# Patient Record
Sex: Male | Born: 1960 | Race: Black or African American | Hispanic: No | Marital: Single | Smoking: Current every day smoker
Health system: Southern US, Community
[De-identification: ages and names within clinical notes are randomized; demographics above are authoritative.]

## PROBLEM LIST (undated history)

## (undated) DIAGNOSIS — I10 Essential (primary) hypertension: Secondary | ICD-10-CM

---

## 2010-07-31 ENCOUNTER — Emergency Department: Payer: Self-pay | Admitting: Emergency Medicine

## 2012-01-02 ENCOUNTER — Emergency Department: Payer: Self-pay | Admitting: Emergency Medicine

## 2013-01-25 ENCOUNTER — Ambulatory Visit: Payer: Self-pay

## 2013-01-25 ENCOUNTER — Emergency Department: Payer: Self-pay | Admitting: Emergency Medicine

## 2013-01-30 ENCOUNTER — Emergency Department: Payer: Self-pay | Admitting: Emergency Medicine

## 2013-10-10 ENCOUNTER — Emergency Department: Payer: Self-pay | Admitting: Emergency Medicine

## 2013-10-10 LAB — URINALYSIS, COMPLETE
BACTERIA: NONE SEEN
BILIRUBIN, UR: NEGATIVE
BLOOD: NEGATIVE
GLUCOSE, UR: NEGATIVE mg/dL (ref 0–75)
Ketone: NEGATIVE
Leukocyte Esterase: NEGATIVE
Nitrite: NEGATIVE
PH: 7 (ref 4.5–8.0)
PROTEIN: NEGATIVE
SQUAMOUS EPITHELIAL: NONE SEEN
Specific Gravity: 1.015 (ref 1.003–1.030)
WBC UR: <1 /HPF (ref 0–5)

## 2013-10-10 LAB — BASIC METABOLIC PANEL
ANION GAP: 5 — AB (ref 7–16)
BUN: 17 mg/dL (ref 7–18)
CALCIUM: 8.6 mg/dL (ref 8.5–10.1)
CO2: 27 mmol/L (ref 21–32)
CREATININE: 1.03 mg/dL (ref 0.60–1.30)
Chloride: 111 mmol/L — ABNORMAL HIGH (ref 98–107)
EGFR (African American): >60
GLUCOSE: 89 mg/dL (ref 65–99)
OSMOLALITY: 286 (ref 275–301)
Potassium: 3.8 mmol/L (ref 3.5–5.1)
SODIUM: 143 mmol/L (ref 136–145)

## 2013-10-10 LAB — GC/CHLAMYDIA PROBE AMP

## 2014-12-22 ENCOUNTER — Emergency Department
Admission: EM | Admit: 2014-12-22 | Discharge: 2014-12-22 | Disposition: A | Discharge status: Home / Self Care | Payer: Self-pay | Attending: Emergency Medicine | Admitting: Emergency Medicine

## 2014-12-22 DIAGNOSIS — K047 Periapical abscess without sinus: Secondary | ICD-10-CM | POA: Insufficient documentation

## 2014-12-22 DIAGNOSIS — I10 Essential (primary) hypertension: Secondary | ICD-10-CM | POA: Insufficient documentation

## 2014-12-22 MED ORDER — TRAMADOL HCL 50 MG PO TABS: 50.0000 mg | ORAL_TABLET | Freq: Four times a day (QID) | ORAL | Status: DC | PRN

## 2014-12-22 MED ORDER — AMOXICILLIN 500 MG PO TABS: 500.0000 mg | ORAL_TABLET | Freq: Three times a day (TID) | ORAL | Status: DC

## 2014-12-22 NOTE — ED Notes (Signed)
Pt with right face/ cheek swelling that started yesterday and got worse today. Has dental problems that seems to be causing infection.

## 2014-12-22 NOTE — ED Provider Notes (Signed)
[facility] Emergency Department Provider Note ____________________________________________  Time seen: Approximately 3:36 PM  I have reviewed the triage vital signs and the nursing notes.   HISTORY  Chief Complaint Abscess   HPI Paul Leonard is a 54 y.o. male who presents to the emergency department for evaluation of right-sided facial swelling that started on Friday. He states that this tooth has been bad for a long time and the swelling comes and goes, but this time it has become very painful. He has not been on any antibiotics the last 30 days. He has attempted to take Advil which has given him some relief but the swelling continues.   No past medical history on file.  There are no active problems to display for this patient.   No past surgical history on file.  Current Outpatient Rx  Name  Route  Sig  Dispense  Refill  . amoxicillin (AMOXIL) 500 MG tablet   Oral   Take 1 tablet (500 mg total) by mouth 3 (three) times daily.   30 tablet   0   . traMADol (ULTRAM) 50 MG tablet   Oral   Take 1 tablet (50 mg total) by mouth every 6 (six) hours as needed.   9 tablet   0     Allergies Review of patient's allergies indicates no known allergies.  No family history on file.  Social History Social History  Substance Use Topics  . Smoking status: Not on file  . Smokeless tobacco: Not on file  . Alcohol Use: Not on file    Review of Systems Constitutional: No fever/chills Eyes: No visual changes. ENT: No sore throat. Cardiovascular: Denies chest pain. Respiratory: Denies shortness of breath. Gastrointestinal: No abdominal pain.  No nausea, no vomiting.  Genitourinary: Negative for dysuria. Musculoskeletal: Negative for back pain. Skin: Negative for rash. Neurological: Negative for headaches, focal weakness or numbness. 10-point ROS otherwise negative.  ____________________________________________   PHYSICAL EXAM:  VITAL  SIGNS: ED Triage Vitals  Enc Vitals Group     BP 12/22/14 1516 170/109 mmHg     Pulse Rate 12/22/14 1516 70     Resp 12/22/14 1516 18     Temp 12/22/14 1516 98.2 F (36.8 C)     Temp Source 12/22/14 1516 Oral     SpO2 12/22/14 1516 97 %     Weight 12/22/14 1516 195 lb (88.451 kg)     Height 12/22/14 1516 5\' 11"  (1.803 m)     Head Cir --      Peak Flow --      Pain Score 12/22/14 1517 8     Pain Loc --      Pain Edu? --      Excl. in [place]? --     Constitutional: Alert and oriented. Well appearing and in no acute distress. Eyes: Conjunctivae are normal. PERRL. EOMI. Head: Atraumatic. Nose: No congestion/rhinnorhea. Mouth/Throat: Mucous membranes are moist.  Oropharynx non-erythematous. Mild mandibular swelling noted on the right side. Periodontal Exam    Neck: No stridor.  Hematological/Lymphatic/Immunilogical: No cervical lymphadenopathy. Cardiovascular:   Good peripheral circulation. Respiratory: Normal respiratory effort.  No retractions. Musculoskeletal: No lower extremity tenderness nor edema.  No joint effusions. Neurologic:  Normal speech and language. No gross focal neurologic deficits are appreciated. Speech is normal. No gait instability. Skin:  Skin is warm, dry and intact. No rash noted. Psychiatric: Mood and affect are normal. Speech and behavior are normal.  ____________________________________________   LABS (all labs  ordered are listed, but only abnormal results are displayed)  Labs Reviewed - No data to display ____________________________________________   RADIOLOGY  Not indicated ____________________________________________   PROCEDURES  Procedure(s) performed: None  Critical Care performed: No  ____________________________________________   INITIAL IMPRESSION / ASSESSMENT AND PLAN / ED COURSE  Pertinent labs & imaging results that were available during my care of the patient were reviewed by me and considered in my medical decision  making (see chart for details).  Patient was advised to see the dentist within 14 days. Also advised to take the antibiotic until finished. Instructed to return to the ER for symptoms that change or worsen if you are unable to schedule an appointment. ____________________________________________   FINAL CLINICAL IMPRESSION(S) / ED DIAGNOSES  Final diagnoses:  Dental abscess  Essential hypertension       Victorino Dike, FNP 12/22/14 1730  Daymon Larsen, MD 12/22/14 1754

## 2014-12-22 NOTE — ED Notes (Signed)
Rt sided facial swelling since Friday.

## 2014-12-22 NOTE — ED Notes (Signed)
NAD noted at time of D/C. Pt denies questions or concerns. Pt ambulatory to the lobby at this time.  

## 2014-12-22 NOTE — Discharge Instructions (Signed)
Dental Abscess °A dental abscess is a collection of pus in or around a tooth. °CAUSES °This condition is caused by a bacterial infection around the root of the tooth that involves the inner part of the tooth (pulp). It may result from: °· Severe tooth decay. °· Trauma to the tooth that allows bacteria to enter into the pulp, such as a broken or chipped tooth. °· Severe gum disease around a tooth. °SYMPTOMS °Symptoms of this condition include: °· Severe pain in and around the infected tooth. °· Swelling and redness around the infected tooth, in the mouth, or in the face. °· Tenderness. °· Pus drainage. °· Bad breath. °· Bitter taste in the mouth. °· Difficulty swallowing. °· Difficulty opening the mouth. °· Nausea. °· Vomiting. °· Chills. °· Swollen neck glands. °· Fever. °DIAGNOSIS °This condition is diagnosed with examination of the infected tooth. During the exam, your dentist may tap on the infected tooth. Your dentist will also ask about your medical and dental history and may order X-rays. °TREATMENT °This condition is treated by eliminating the infection. This may be done with: °· Antibiotic medicine. °· A root canal. This may be performed to save the tooth. °· Pulling (extracting) the tooth. This may also involve draining the abscess. This is done if the tooth cannot be saved. °HOME CARE INSTRUCTIONS °· Take medicines only as directed by your dentist. °· If you were prescribed antibiotic medicine, finish all of it even if you start to feel better. °· Rinse your mouth (gargle) often with salt water to relieve pain or swelling. °· Do not drive or operate heavy machinery while taking pain medicine. °· Do not apply heat to the outside of your mouth. °· Keep all follow-up visits as directed by your dentist. This is important. °SEEK MEDICAL CARE IF: °· Your pain is worse and is not helped by medicine. °SEEK IMMEDIATE MEDICAL CARE IF: °· You have a fever or chills. °· Your symptoms suddenly get worse. °· You have a  very bad headache. °· You have problems breathing or swallowing. °· You have trouble opening your mouth. °· You have swelling in your neck or around your eye. °  °This information is not intended to replace advice given to you by your health care provider. Make sure you discuss any questions you have with your health care provider. °  °Document Released: 03/01/2005 Document Revised: 07/16/2014 Document Reviewed: 02/26/2014 °Elsevier Interactive Patient Education ©2016 [employer] ° ° ° ° ° ° °OPTIONS FOR DENTAL FOLLOW UP CARE ° °[place] [facility] and [facility] - Local Safety Net Dental Clinics °http://www.ncdhhs.gov/dph/oralhealth/services/safetynetclinics.htm °  °[facility] (336-562-3123) ° °[facility] [place] (919-933-9087) ° °[facility] (919-663-1744 ext 237) ° °[facility] (336-570-6415) ° °[facility] (919-968-2025) °This clinic caters to the indigent population and is on a lottery system. °Location: °[facility], [address], [place] °Clinic Hours: °Wednesdays from 6pm - 9pm, patients seen by a lottery system. °For dates, call or go to www.med.[facility].edu/[facility]/patients/Dental-[facility] °Services: °Cleanings, fillings and simple extractions. °Payment Options: °DENTAL WORK IS FREE OF CHARGE. Bring proof of income or support. °Best way to get seen: °Arrive at 5:15 pm - this is a lottery, NOT first come/first serve, so arriving earlier will not increase your chances of being seen. °  °  °[facility] °919-537-3737 °Select option 1 for emergencies °  °Location: °[facility], [address], [place] °  Clinic Hours: °No walk-ins accepted - call the day before to schedule an appointment. °Check in times are 9:30 am and 1:30 pm. °Services: °Simple extractions, temporary fillings, pulpectomy/pulp debridement, uncomplicated abscess drainage. °Payment Options: °PAYMENT IS  DUE AT THE TIME OF SERVICE.  Fee is usually $100-200, additional surgical procedures (e.g. abscess drainage) may be extra. °Cash, checks, Visa/MasterCard accepted.  Can file Medicaid if patient is covered for dental - patient should call case worker to check. °No discount for [facility] patients. °Best way to get seen: °MUST call the day before and get onto the schedule. Can usually be seen the next 1-2 days. No walk-ins accepted. °  °  °[facility] °[phone] °  °Location: °[facility], [address], [place] °Clinic Hours: °M, W, Th, F 8am or 1:30pm, Tues 9a or 1:30 - first come/first served. °Services: °Simple extractions, temporary fillings, uncomplicated abscess drainage.  You do not need to be an [place] resident. °Payment Options: °PAYMENT IS DUE AT THE TIME OF SERVICE. °Dental insurance, otherwise sliding scale - bring proof of income or support. °Depending on income and treatment needed, cost is usually $50-200. °Best way to get seen: °Arrive early as it is first come/first served. °  °  °[facility] °[phone] °  °Location: °[address] °Clinic Hours: °Mon-Thu 8a-5p °Services: °Most basic dental services including extractions and fillings. °Payment Options: °PAYMENT IS DUE AT THE TIME OF SERVICE. °Sliding scale, up to 50% off - bring proof if income or support. °Medicaid with dental option accepted. °Best way to get seen: °Call to schedule an appointment, can usually be seen within 2 weeks OR they will try to see walk-ins - show up at 8a or 2p (you may have to wait). °  °  °[facility] °919-245-2435 °[place] RESIDENTS ONLY °  °Location: °[facility], [address], [place], [place] [address] °Clinic Hours: By appointment only. °Monday - Thursday 8am-5pm, Friday 8am-12pm °Services: Cleanings, fillings, extractions. °Payment Options: °PAYMENT IS DUE AT THE TIME OF  SERVICE. °Cash, Visa or MasterCard. Sliding scale - $30 minimum per service. °Best way to get seen: °Come in to office, complete packet and make an appointment - need proof of income °or support monies for each household member and proof of [place] residence. °Usually takes about a month to get in. °  °  °[facility] °[phone] °  °Location: °[address] [place] °Clinic Hours: Walk-in Urgent Care Dental Services are offered Monday-Friday mornings only. °The numbers of emergencies accepted daily is limited to the number of °providers available. °Maximum 15 - Mondays, Wednesdays & Thursdays °Maximum 10 - Tuesdays & Fridays °Services: °You do not need to be a [place] resident to be seen for a dental emergency. °Emergencies are defined as pain, swelling, abnormal bleeding, or dental trauma. Walkins will receive x-rays if needed. °NOTE: Dental cleaning is not an emergency. °Payment Options: °PAYMENT IS DUE AT THE TIME OF SERVICE. °Minimum co-pay is $40.00 for uninsured patients. °Minimum co-pay is $3.00 for Medicaid with dental coverage. °Dental Insurance is accepted and must be presented at time of visit. °Medicare does not cover dental. °Forms of payment: Cash, credit card, checks. °Best way to get seen: °If not previously registered with the clinic, walk-in dental registration begins at 7:15 am and is on a first come/first serve basis. °If previously registered with the clinic, call to make an appointment. °  °  °  [facility] °[phone] °[place] RESIDENTS ONLY °  °Location: °[address], [place], [place] °Clinic Hours: °Mon-Thu 10a-2p °Services: Extractions only! °Payment Options: °FREE (donations accepted) - bring proof of income or support °Best way to get seen: °Call and schedule an appointment OR come at 8am on the 1st Monday of every month (except for holidays) when it is first come/first served. °  °  °[facility] °[phone] °   °Location: °[address],  °Clinic Hours: °Friday mornings °Services, Payment Options, Best way to get seen: °Call for info °

## 2019-01-01 ENCOUNTER — Encounter: Payer: Self-pay | Admitting: Emergency Medicine

## 2019-01-01 ENCOUNTER — Inpatient Hospital Stay
Admission: EM | Admit: 2019-01-01 | Discharge: 2019-01-04 | DRG: 064 | Disposition: A | Discharge status: Home Health | Payer: Medicaid Other | Attending: Internal Medicine | Admitting: Internal Medicine

## 2019-01-01 ENCOUNTER — Emergency Department: Payer: Medicaid Other

## 2019-01-01 ENCOUNTER — Other Ambulatory Visit: Payer: Self-pay

## 2019-01-01 DIAGNOSIS — Z8249 Family history of ischemic heart disease and other diseases of the circulatory system: Secondary | ICD-10-CM

## 2019-01-01 DIAGNOSIS — G936 Cerebral edema: Secondary | ICD-10-CM | POA: Diagnosis present

## 2019-01-01 DIAGNOSIS — I771 Stricture of artery: Secondary | ICD-10-CM

## 2019-01-01 DIAGNOSIS — I6312 Cerebral infarction due to embolism of basilar artery: Principal | ICD-10-CM | POA: Diagnosis present

## 2019-01-01 DIAGNOSIS — R109 Unspecified abdominal pain: Secondary | ICD-10-CM

## 2019-01-01 DIAGNOSIS — K5900 Constipation, unspecified: Secondary | ICD-10-CM | POA: Diagnosis not present

## 2019-01-01 DIAGNOSIS — F17210 Nicotine dependence, cigarettes, uncomplicated: Secondary | ICD-10-CM | POA: Diagnosis present

## 2019-01-01 DIAGNOSIS — R29703 NIHSS score 3: Secondary | ICD-10-CM | POA: Diagnosis present

## 2019-01-01 DIAGNOSIS — R4781 Slurred speech: Secondary | ICD-10-CM | POA: Diagnosis present

## 2019-01-01 DIAGNOSIS — D320 Benign neoplasm of cerebral meninges: Secondary | ICD-10-CM | POA: Diagnosis present

## 2019-01-01 DIAGNOSIS — R911 Solitary pulmonary nodule: Secondary | ICD-10-CM | POA: Diagnosis present

## 2019-01-01 DIAGNOSIS — I10 Essential (primary) hypertension: Secondary | ICD-10-CM | POA: Diagnosis present

## 2019-01-01 DIAGNOSIS — I639 Cerebral infarction, unspecified: Secondary | ICD-10-CM

## 2019-01-01 DIAGNOSIS — Z20828 Contact with and (suspected) exposure to other viral communicable diseases: Secondary | ICD-10-CM | POA: Diagnosis present

## 2019-01-01 DIAGNOSIS — F1490 Cocaine use, unspecified, uncomplicated: Secondary | ICD-10-CM | POA: Diagnosis present

## 2019-01-01 DIAGNOSIS — I6300 Cerebral infarction due to thrombosis of unspecified precerebral artery: Secondary | ICD-10-CM

## 2019-01-01 DIAGNOSIS — I739 Peripheral vascular disease, unspecified: Secondary | ICD-10-CM | POA: Diagnosis present

## 2019-01-01 DIAGNOSIS — G9389 Other specified disorders of brain: Secondary | ICD-10-CM

## 2019-01-01 HISTORY — DX: Essential (primary) hypertension: I10

## 2019-01-01 LAB — CBC
HCT: 48 % (ref 39.0–52.0)
Hemoglobin: 15.1 g/dL (ref 13.0–17.0)
MCH: 27.9 pg (ref 26.0–34.0)
MCHC: 31.5 g/dL (ref 30.0–36.0)
MCV: 88.7 fL (ref 80.0–100.0)
Platelets: 287 10*3/uL (ref 150–400)
RBC: 5.41 MIL/uL (ref 4.22–5.81)
RDW: 12.8 % (ref 11.5–15.5)
WBC: 4.5 10*3/uL (ref 4.0–10.5)
nRBC: 0 % (ref 0.0–0.2)

## 2019-01-01 LAB — APTT: aPTT: 34 seconds (ref 24–36)

## 2019-01-01 LAB — DIFFERENTIAL
Abs Immature Granulocytes: 0.01 10*3/uL (ref 0.00–0.07)
Basophils Absolute: 0 10*3/uL (ref 0.0–0.1)
Basophils Relative: 0 %
Eosinophils Absolute: 0.1 10*3/uL (ref 0.0–0.5)
Eosinophils Relative: 1 %
Immature Granulocytes: 0 %
Lymphocytes Relative: 33 %
Lymphs Abs: 1.5 10*3/uL (ref 0.7–4.0)
Monocytes Absolute: 0.5 10*3/uL (ref 0.1–1.0)
Monocytes Relative: 10 %
Neutro Abs: 2.5 10*3/uL (ref 1.7–7.7)
Neutrophils Relative %: 56 %

## 2019-01-01 LAB — COMPREHENSIVE METABOLIC PANEL
ALT: 15 U/L (ref 0–44)
AST: 15 U/L (ref 15–41)
Albumin: 3.8 g/dL (ref 3.5–5.0)
Alkaline Phosphatase: 55 U/L (ref 38–126)
Anion gap: 7 (ref 5–15)
BUN: 15 mg/dL (ref 6–20)
CO2: 28 mmol/L (ref 22–32)
Calcium: 8.9 mg/dL (ref 8.9–10.3)
Chloride: 107 mmol/L (ref 98–111)
Creatinine, Ser: 0.87 mg/dL (ref 0.61–1.24)
GFR calc Af Amer: >60 mL/min (ref >60)
GFR calc non Af Amer: >60 mL/min (ref >60)
Glucose, Bld: 104 mg/dL — ABNORMAL HIGH (ref 70–99)
Potassium: 3.5 mmol/L (ref 3.5–5.1)
Sodium: 142 mmol/L (ref 135–145)
Total Bilirubin: 0.7 mg/dL (ref 0.3–1.2)
Total Protein: 6.7 g/dL (ref 6.5–8.1)

## 2019-01-01 LAB — PROTIME-INR
INR: 1 (ref 0.8–1.2)
Prothrombin Time: 13.2 seconds (ref 11.4–15.2)

## 2019-01-01 MED ORDER — METOPROLOL TARTRATE 5 MG/5ML IV SOLN
5.0000 mg | INTRAVENOUS | Status: DC | PRN
  Filled 2019-01-01: qty 5

## 2019-01-01 MED ORDER — ACETAMINOPHEN 160 MG/5ML PO SOLN
650.0000 mg | ORAL | Status: DC | PRN
  Filled 2019-01-01: qty 20.3

## 2019-01-01 MED ORDER — SENNOSIDES-DOCUSATE SODIUM 8.6-50 MG PO TABS: 1.0000 | ORAL_TABLET | Freq: Every evening | ORAL | Status: DC | PRN

## 2019-01-01 MED ORDER — HYDRALAZINE HCL 20 MG/ML IJ SOLN
5.0000 mg | Freq: Once | INTRAMUSCULAR | Status: AC
  Administered 2019-01-01: 18:00:00 5 mg via INTRAVENOUS
  Filled 2019-01-01: qty 1

## 2019-01-01 MED ORDER — ACETAMINOPHEN 650 MG RE SUPP: 650.0000 mg | RECTAL | Status: DC | PRN

## 2019-01-01 MED ORDER — ACETAMINOPHEN 325 MG PO TABS
650.0000 mg | ORAL_TABLET | ORAL | Status: DC | PRN
  Administered 2019-01-04: 650 mg via ORAL
  Filled 2019-01-01: qty 2

## 2019-01-01 MED ORDER — ASPIRIN EC 325 MG PO TBEC
325.0000 mg | DELAYED_RELEASE_TABLET | Freq: Every day | ORAL | Status: DC
  Administered 2019-01-02: 325 mg via ORAL
  Filled 2019-01-01: qty 1

## 2019-01-01 MED ORDER — METOPROLOL TARTRATE 25 MG PO TABS
25.0000 mg | ORAL_TABLET | Freq: Two times a day (BID) | ORAL | Status: DC
  Administered 2019-01-01 – 2019-01-04 (×6): 25 mg via ORAL
  Filled 2019-01-01 (×6): qty 1

## 2019-01-01 MED ORDER — STROKE: EARLY STAGES OF RECOVERY BOOK
Freq: Once | Status: AC
  Administered 2019-01-01: 21:00:00

## 2019-01-01 MED ORDER — ONDANSETRON HCL 4 MG/2ML IJ SOLN: 4.0000 mg | Freq: Four times a day (QID) | INTRAMUSCULAR | Status: DC | PRN

## 2019-01-01 MED ORDER — DEXAMETHASONE 4 MG PO TABS
4.0000 mg | ORAL_TABLET | Freq: Three times a day (TID) | ORAL | Status: DC
  Administered 2019-01-01 – 2019-01-02 (×3): 4 mg via ORAL
  Filled 2019-01-01 (×4): qty 1

## 2019-01-01 MED ORDER — GADOBUTROL 1 MMOL/ML IV SOLN
8.0000 mL | Freq: Once | INTRAVENOUS | Status: AC | PRN
  Administered 2019-01-01: 16:00:00 8 mL via INTRAVENOUS

## 2019-01-01 MED ORDER — ENOXAPARIN SODIUM 40 MG/0.4ML ~~LOC~~ SOLN
40.0000 mg | SUBCUTANEOUS | Status: DC
  Administered 2019-01-01 – 2019-01-03 (×3): 40 mg via SUBCUTANEOUS
  Filled 2019-01-01 (×3): qty 0.4

## 2019-01-01 MED ORDER — NICOTINE 14 MG/24HR TD PT24
14.0000 mg | MEDICATED_PATCH | Freq: Every day | TRANSDERMAL | Status: DC
  Administered 2019-01-01 – 2019-01-04 (×4): 14 mg via TRANSDERMAL
  Filled 2019-01-01 (×4): qty 1

## 2019-01-01 MED ORDER — HYDRALAZINE HCL 50 MG PO TABS
25.0000 mg | ORAL_TABLET | Freq: Three times a day (TID) | ORAL | Status: DC
  Administered 2019-01-01 – 2019-01-02 (×2): 25 mg via ORAL
  Filled 2019-01-01 (×2): qty 1

## 2019-01-01 MED ORDER — ATORVASTATIN CALCIUM 20 MG PO TABS
40.0000 mg | ORAL_TABLET | Freq: Every day | ORAL | Status: DC
  Administered 2019-01-01 – 2019-01-03 (×3): 40 mg via ORAL
  Filled 2019-01-01 (×3): qty 2

## 2019-01-01 MED ORDER — ASPIRIN 81 MG PO CHEW
324.0000 mg | CHEWABLE_TABLET | Freq: Once | ORAL | Status: AC
  Administered 2019-01-01: 18:00:00 324 mg via ORAL
  Filled 2019-01-01: qty 4

## 2019-01-01 MED ORDER — DEXAMETHASONE SODIUM PHOSPHATE 4 MG/ML IJ SOLN
4.0000 mg | Freq: Once | INTRAMUSCULAR | Status: AC
  Administered 2019-01-01: 4 mg via INTRAVENOUS
  Filled 2019-01-01: qty 1

## 2019-01-01 NOTE — ED Triage Notes (Signed)
Pt to ED from home c/o HTN and numbness to left arm and leg, slurred speech since Friday or Saturday.  Denies new pain, fever, or SOB.  Pt has decreased strength left hand and leg.  States hx of left rotator cuff injury.  Last used cocaine Friday 10/16.  Pt A&Ox4, chest rise even and unlabored at this time.

## 2019-01-01 NOTE — Consult Note (Signed)
Referring Physician:  No referring provider defined for this encounter.  Primary Physician:  Patient, No Pcp Per  Chief Complaint: Meningioma  History of Present Illness: Paul Leonard is a 58 y.o. male who presents as an ED consult for meningioma.  Patient presented to emergency department today after experiencing 3 days of increasing left-sided weakness -greater in lower extremities, and worse in AM.  Also complains of left frontal headache, increased blood pressure, slurred speech.  Head CT without contrast was completed and 4 cm mass was noted.  Neurosurgery was consulted for evaluation Denies any vision changes, recent fall/trauma, confusion, behavioral changes.  Review of Systems:  A 10 point review of systems is negative, except for the pertinent positives and negatives detailed in the HPI.  Past Medical History: Past Medical History:  Diagnosis Date  . Hypertension     Past Surgical History: History reviewed. No pertinent surgical history.  Allergies: Allergies as of 01/01/2019  . (No Known Allergies)    Medications: No current facility-administered medications for this encounter.   Current Outpatient Medications:  .  amoxicillin (AMOXIL) 500 MG tablet, Take 1 tablet (500 mg total) by mouth 3 (three) times daily. (Patient not taking: Reported on 01/01/2019), Disp: 30 tablet, Rfl: 0 .  traMADol (ULTRAM) 50 MG tablet, Take 1 tablet (50 mg total) by mouth every 6 (six) hours as needed. (Patient not taking: Reported on 01/01/2019), Disp: 9 tablet, Rfl: 0   Social History: Social History   Tobacco Use  . Smoking status: Current Every Day Smoker    Packs/day: 1.00    Types: Cigarettes  . Smokeless tobacco: Never Used  Substance Use Topics  . Alcohol use: Yes    Alcohol/week: 1.0 standard drinks    Types: 1 Cans of beer per week  . Drug use: Yes    Types: Cocaine    Comment: Last used 10/16    Family Medical History: History reviewed. No pertinent family  history.  Physical Examination: Vitals:   01/01/19 1438 01/01/19 1611  BP: (!) 200/125 (!) 209/118  Pulse: 65 63  Resp: 17 16  Temp:    SpO2: 98% 98%     General: Patient is well developed, well nourished, calm, collected, and in no apparent distress.  Psychiatric: Patient is non-anxious.  Head:  Pupils equal, round, minimally reactive to light..  Neck:   Supple.  Full range of motion.  Respiratory: Patient is breathing without any difficulty.  Extremities: No edema.  Skin:   On exposed skin, there are no abnormal skin lesions.  NEUROLOGICAL:  General: In no acute distress.   Awake, alert, oriented to person, place, and time.  Facial tone/sensation is symmetric.  There is slight pronator drift on left. Able to correctly identify 5 objects without hesitation   Strength: Side Biceps Triceps Deltoid Interossei Grip  R 5 5 5 5 5   L 5 5 4+_ 5 4+   Side Iliopsoas Quads Hamstring PF DF EHL  R 5 5 5 5 5 5   L 5 5 5 5 5 5    Reflexes are 3+ and symmetric at the biceps, triceps, brachioradialis, patella and achilles.   Bilateral upper and lower extremity sensation is intact to light touch.  Clonus is not present. Gait was not assessed.  Hoffman's is absent.  Imaging: CT head without contrast 01/01/2019  IMPRESSION: 4 cm mass at the right parasagittal vertex which could be intra or extra-axial. There is local mass effect with mild brain edema, recommend MRI with contrast  if possible  MRI brain with without contrast IMPRESSION: 1. 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 2. 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 3. 2.8 cm right parietal parafalcine extra-axial mass consistent with a meningioma. Mild vasogenic edema and local mass effect. 4. Mild chronic small vessel ischemic disease.  Assessment and Plan: Paul Leonard is a pleasant 58 y.o. male with new onset left-sided weakness and acute speech changes with image findings of  meningioma and multiple areas of infarct.  Due to the acuity of symptoms, areas of infarct are likely a greater contributing factor than meningioma.  Recommend neurology consult for further evaluation.  I have spoken to the patient about meningioma findings, and recommend follow-up with Dr. Lacinda Axon at his convenience to discuss surgical options.  All questions and concerns were addressed to the best my ability.   Marin Olp, PA-C Dept. of Neurosurgery

## 2019-01-01 NOTE — ED Notes (Addendum)
ED TO INPATIENT HANDOFF REPORT  ED Nurse Name and Phone #:  Gershon Mussel RN  [phone]  S Name/Age/Gender Paul Leonard 58 y.o. male Room/Bed: ED05HA/ED05HA  Code Status   Code Status: Not on file  Home/SNF/Other Home Patient oriented to: self, place, time and situation Is this baseline? Yes   Triage Complete: Triage complete  Chief Complaint elevated blood pressure sluured speech l arm weakness  Triage Note Per pt girlfriend, pt began to feel dizzy last Wednesday and then Saturday he started with trouble walking and need help sitting up. EMS got a BP of 185, Pt girlfriend reports he also has blurred vision.   Pt to ED from home c/o HTN and numbness to left arm and leg, slurred speech since Friday or Saturday.  Denies new pain, fever, or SOB.  Pt has decreased strength left hand and leg.  States hx of left rotator cuff injury.  Last used cocaine Friday 10/16.  Pt A&Ox4, chest rise even and unlabored at this time.   Allergies No Known Allergies  Level of Care/Admitting Diagnosis ED Disposition    ED Disposition Condition [facility] Area: [place] [100120]  Level of Care: Med-Surg [16]  Covid Evaluation: Asymptomatic Screening Protocol (No Symptoms)  Diagnosis: [id number]  Admitting Physician: Demetrios Loll [phone]  Attending Physician: Demetrios Loll [id number]  Estimated length of stay: past midnight tomorrow  Certification:: I certify this patient will need inpatient services for at least 2 midnights  PT Class (Do Not Modify): Inpatient [101]  PT Acc Code (Do Not Modify): Private [1]       B Medical/Surgery History Past Medical History:  Diagnosis Date  . Hypertension    History reviewed. No pertinent surgical history.   A IV Location/Drains/Wounds Patient Lines/Drains/Airways Status   Active Line/Drains/Airways    Name:   Placement date:   Placement time:   Site:   Days:   Peripheral IV  01/01/19 Right Forearm   01/01/19    1400    Forearm   less than 1          Intake/Output Last 24 hours No intake or output data in the 24 hours ending 01/01/19 1928  Labs/Imaging Results for orders placed or performed during the hospital encounter of 01/01/19 (from the past 48 hour(s))  Protime-INR     Status: None   Collection Time: 01/01/19 11:29 AM  Result Value Ref Range   Prothrombin Time 13.2 11.4 - 15.2 seconds   INR 1.0 0.8 - 1.2    Comment: (NOTE) INR goal varies based on device and disease states. Performed at [facility], [address] [place], [place] [address]   APTT     Status: None   Collection Time: 01/01/19 11:29 AM  Result Value Ref Range   aPTT 34 24 - 36 seconds    Comment: Performed at [facility], [address] [place], [place] [address]  CBC     Status: None   Collection Time: 01/01/19 11:29 AM  Result Value Ref Range   WBC 4.5 4.0 - 10.5 K/uL   RBC 5.41 4.22 - 5.81 MIL/uL   Hemoglobin 15.1 13.0 - 17.0 g/dL   HCT 48.0 39.0 - 52.0 %   MCV 88.7 80.0 - 100.0 fL   MCH 27.9 26.0 - 34.0 pg   MCHC 31.5 30.0 - 36.0 g/dL   RDW 12.8 11.5 - 15.5 %   Platelets 287 150 -  400 K/uL   nRBC 0.0 0.0 - 0.2 %    Comment: Performed at [facility], [address] [place], [place] [address]  Differential     Status: None   Collection Time: 01/01/19 11:29 AM  Result Value Ref Range   Neutrophils Relative % 56 %   Neutro Abs 2.5 1.7 - 7.7 K/uL   Lymphocytes Relative 33 %   Lymphs Abs 1.5 0.7 - 4.0 K/uL   Monocytes Relative 10 %   Monocytes Absolute 0.5 0.1 - 1.0 K/uL   Eosinophils Relative 1 %   Eosinophils Absolute 0.1 0.0 - 0.5 K/uL   Basophils Relative 0 %   Basophils Absolute 0.0 0.0 - 0.1 K/uL   Immature Granulocytes 0 %   Abs Immature Granulocytes 0.01 0.00 - 0.07 K/uL    Comment: Performed at [facility], [address] [place], [place] [address]  Comprehensive metabolic panel     Status: Abnormal    Collection Time: 01/01/19 11:29 AM  Result Value Ref Range   Sodium 142 135 - 145 mmol/L   Potassium 3.5 3.5 - 5.1 mmol/L   Chloride 107 98 - 111 mmol/L   CO2 28 22 - 32 mmol/L   Glucose, Bld 104 (H) 70 - 99 mg/dL   BUN 15 6 - 20 mg/dL   Creatinine, Ser 0.87 0.61 - 1.24 mg/dL   Calcium 8.9 8.9 - 10.3 mg/dL   Total Protein 6.7 6.5 - 8.1 g/dL   Albumin 3.8 3.5 - 5.0 g/dL   AST 15 15 - 41 U/L   ALT 15 0 - 44 U/L   Alkaline Phosphatase 55 38 - 126 U/L   Total Bilirubin 0.7 0.3 - 1.2 mg/dL   GFR calc non Af Amer >60 >60 mL/min   GFR calc Af Amer >60 >60 mL/min   Anion gap 7 5 - 15    Comment: Performed at [facility], [address], [place], [place] [address]   Ct Head Wo Contrast  Result Date: 01/01/2019 CLINICAL DATA:  Possible stroke. Focal neuro deficit which is not specified EXAM: CT HEAD WITHOUT CONTRAST TECHNIQUE: Contiguous axial images were obtained from the base of the skull through the vertex without intravenous contrast. COMPARISON:  None. FINDINGS: Brain: 4 cm mass the right frontal parietal junction which is relatively high density and could be meningioma but it is unclear if the mass is intra or extra-axial. There is local cerebral mass effect with mild white matter low-density. No acute hemorrhage, hydrocephalus, or collection. Dural calcifications along the tentorium. Vascular: Scattered atherosclerotic calcifications. Skull: No acute or aggressive finding. No erosion or hyperostosis seen at the level of the mass. Sinuses/Orbits: Negative IMPRESSION: 4 cm mass at the right parasagittal vertex which could be intra or extra-axial. There is local mass effect with mild brain edema, recommend MRI with contrast if possible. Electronically Signed   By: Monte Fantasia M.D.   On: 01/01/2019 11:47   Mr [name] And Wo Contrast  Result Date: 01/01/2019 CLINICAL DATA:  Left-sided weakness and headache. Brain mass on CT. EXAM: MRI HEAD WITHOUT AND WITH CONTRAST TECHNIQUE:  Multiplanar, multiecho pulse sequences of the brain and surrounding structures were obtained without and with intravenous contrast. CONTRAST:  59mL GADAVIST GADOBUTROL 1 MMOL/ML IV SOLN COMPARISON:  Head CT 01/01/2019 FINDINGS: Brain: Small acute infarcts are present in the right paramedian pons and right cerebellum including middle cerebellar peduncle. There are also additional small right cerebellar infarcts which are more subacute to  chronic in appearance. There is minimal enhancement associated with some of the right cerebellar infarcts. A homogeneously enhancing parafalcine mass in the right parietal region measures 2.8 x 2.8 x 3.1 cm (AP x transverse x craniocaudal) and appears to be extra-axial. There is local mass effect on the right parietal lobe with mild vasogenic edema in the white matter. No second enhancing intracranial lesion is identified. The ventricles are normal in size. Small foci of T2 hyperintensity in the cerebral white matter bilaterally are nonspecific but compatible with mild chronic small vessel ischemic disease. There is a small chronic cortical infarct in the right frontal operculum. Vascular: Major intracranial vascular flow voids are preserved. Mild generalized intracranial arterial dolichoectasia. Skull and upper cervical spine: Unremarkable bone marrow signal. Sinuses/Orbits: Unremarkable orbits. Clear paranasal sinuses. Trace left mastoid fluid or edema. Other: None. IMPRESSION: 1. 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 2. 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 3. 2.8 cm right parietal parafalcine extra-axial mass consistent with a meningioma. Mild vasogenic edema and local mass effect. 4. Mild chronic small vessel ischemic disease. Electronically Signed   By: Logan Bores M.D.   On: 01/01/2019 16:29    Pending Labs Unresulted Labs (From admission, onward)    Start     Ordered   01/01/19 1909  SARS CORONAVIRUS 2 (TAT 6-24 HRS)  Nasopharyngeal Nasopharyngeal Swab  (Asymptomatic/Tier 2 Patients Labs)  Once,   STAT    Question Answer Comment  Is this test for diagnosis or screening Screening   Symptomatic for COVID-19 as defined by CDC No   Hospitalized for COVID-19 No   Admitted to ICU for COVID-19 No   Previously tested for COVID-19 No   Resident in a congregate (group) care setting No   Employed in healthcare setting No      01/01/19 1915   Signed and Held  HIV Antibody (routine testing w rflx)  (HIV Antibody (Routine testing w reflex) panel)  Once,   R     Signed and Held   Signed and Held  Hemoglobin A1c  Tomorrow morning,   R     Signed and Held   Signed and Held  Lipid panel  Tomorrow morning,   R     Signed and Held          Vitals/Pain Today's Vitals   01/01/19 1438 01/01/19 1611 01/01/19 1800 01/01/19 1927  BP: (!) 200/125 (!) 209/118 (!) 212/126 (!) 176/100  Pulse: 65 63 65 82  Resp: 17 16 18 20   Temp:      TempSrc:      SpO2: 98% 98% 97% 100%  Weight:      Height:      PainSc:    0-No pain    Isolation Precautions No active isolations  Medications Medications  dexamethasone (DECADRON) injection 4 mg (4 mg Intravenous Given 01/01/19 1417)  gadobutrol (GADAVIST) 1 MMOL/ML injection 8 mL (8 mLs Intravenous Contrast Given 01/01/19 1553)  aspirin chewable tablet 324 mg (324 mg Oral Given 01/01/19 1750)  hydrALAZINE (APRESOLINE) injection 5 mg (5 mg Intravenous Given 01/01/19 1805)    Mobility walks Low fall risk   Focused Assessments Neuro Assessment Handoff:  Swallow screen pass? Yes  Cardiac Rhythm: Normal sinus rhythm NIH Stroke Scale ( + Modified Stroke Scale Criteria)  Interval: Initial Level of Consciousness (1a.)   : Alert, keenly responsive LOC Questions (1b. )   +: Answers both questions correctly LOC Commands (1c. )   + :  Performs both tasks correctly Best Gaze (2. )  +: Normal Visual (3. )  +: No visual loss Facial Palsy (4. )    : Normal symmetrical  movements Motor Arm, Left (5a. )   +: No drift Motor Arm, Right (5b. )   +: No drift Motor Leg, Left (6a. )   +: Drift Motor Leg, Right (6b. )   +: No drift Limb Ataxia (7. ): Absent Sensory (8. )   +: Normal, no sensory loss Best Language (9. )   +: No aphasia Dysarthria (10. ): Normal Extinction/Inattention (11.)   +: No Abnormality Modified SS Total  +: 1 Complete NIHSS TOTAL: 1     Neuro Assessment: Exceptions to WDL(pt c/o headache between eyes) Neuro Checks:   Initial (01/01/19 1356)  Last Documented NIHSS Modified Score: 1 (01/01/19 1832) Has TPA been given? No If patient is a Neuro Trauma and patient is going to OR before floor call report to [place] nurse: [phone] or [phone]     R Recommendations: See Admitting Provider Note  Report given to: Kennyth Lose RN on 1C  Additional Notes:

## 2019-01-01 NOTE — ED Notes (Signed)
Waiting on admit bed. Given meal tray. NAD.

## 2019-01-01 NOTE — ED Notes (Signed)
NSU PA at bedside.

## 2019-01-01 NOTE — H&P (Addendum)
[facility] [place] NAME: Paul Leonard    MR#:  [id number]  DATE OF BIRTH:  August 14, 1960  DATE OF ADMISSION:  01/01/2019  PRIMARY CARE PHYSICIAN: Patient, No Pcp Per   REQUESTING/REFERRING PHYSICIAN: Duffy Bruce, MD  CHIEF COMPLAINT:   Chief Complaint  Patient presents with   Hypertension   Numbness   Left-sided weakness and numbness for 3 days. HISTORY OF PRESENT ILLNESS:  Paul Leonard  is a 58 y.o. male with a known history of hypertension.  The patient presented ED with above chief complaint.  The patient started to have left arm and leg weakness and numbness 3 days ago.  He also complains of headache, dizziness, speech changes.  He denies any incontinence or loss of consciousness. MRI brain report acute infarct and Meningioma. Oncall neurosurgery PA suggested starting decadron. The patient is given ASA in ED. PAST MEDICAL HISTORY:   Past Medical History:  Diagnosis Date   Hypertension     PAST SURGICAL HISTORY:  History reviewed. No pertinent surgical history.   SOCIAL HISTORY:   Social History   Tobacco Use   Smoking status: Current Every Day Smoker    Packs/day: 1.00    Types: Cigarettes   Smokeless tobacco: Never Used  Substance Use Topics   Alcohol use: Yes    Alcohol/week: 1.0 standard drinks    Types: 1 Cans of beer per week    FAMILY HISTORY:  History reviewed. No pertinent family history.  Hypertension.  DRUG ALLERGIES:  No Known Allergies  REVIEW OF SYSTEMS:   Review of Systems  Constitutional: Negative for chills, fever and malaise/fatigue.  HENT: Negative for sore throat.   Eyes: Negative for blurred vision and double vision.  Respiratory: Negative for cough, hemoptysis, shortness of breath, wheezing and stridor.   Cardiovascular: Negative for chest pain, palpitations, orthopnea and leg swelling.  Gastrointestinal: Negative for abdominal pain, blood in stool, diarrhea, melena, nausea  and vomiting.  Genitourinary: Negative for dysuria, flank pain and hematuria.  Musculoskeletal: Negative for back pain and joint pain.  Skin: Negative for rash.  Neurological: Positive for dizziness, sensory change, speech change, focal weakness and headaches. Negative for seizures, loss of consciousness and weakness.  Endo/Heme/Allergies: Negative for polydipsia.  Psychiatric/Behavioral: Negative for depression. The patient is not nervous/anxious.     MEDICATIONS AT HOME:   Prior to Admission medications   Medication Sig Start Date End Date Taking? Authorizing Provider  amoxicillin (AMOXIL) 500 MG tablet Take 1 tablet (500 mg total) by mouth 3 (three) times daily. Patient not taking: Reported on 01/01/2019 12/22/14   Sherrie George B, FNP  traMADol (ULTRAM) 50 MG tablet Take 1 tablet (50 mg total) by mouth every 6 (six) hours as needed. Patient not taking: Reported on 01/01/2019 12/22/14   Sherrie George B, FNP      VITAL SIGNS:  Blood pressure (!) 209/118, pulse 63, temperature 98.4 F (36.9 C), temperature source Oral, resp. rate 16, height 5\' 10"  (1.778 m), weight 83.9 kg, SpO2 98 %.  PHYSICAL EXAMINATION:  Physical Exam  GENERAL:  58 y.o.-year-old patient lying in the bed with no acute distress.  EYES: Pupils equal, round, reactive to light and accommodation. No scleral icterus. Extraocular muscles intact.  HEENT: Head atraumatic, normocephalic.  NECK:  Supple, no jugular venous distention. No thyroid enlargement, no tenderness.  LUNGS: Normal breath sounds bilaterally, no wheezing, rales,rhonchi or crepitation. No use of accessory muscles of respiration.  CARDIOVASCULAR: S1, S2 normal. No  murmurs, rubs, or gallops.  ABDOMEN: Soft, nontender, nondistended. Bowel sounds present. No organomegaly or mass.  EXTREMITIES: No pedal edema, cyanosis, or clubbing.  NEUROLOGIC: Cranial nerves II through XII are intact. Muscle strength 5/5 in right extremities and 4 out of 5 in left  extremity. Sensation intact. Gait not checked.  PSYCHIATRIC: The patient is alert and oriented x 3.  SKIN: No obvious rash, lesion, or ulcer.   LABORATORY PANEL:   CBC Recent Labs  Lab 01/01/19 1129  WBC 4.5  HGB 15.1  HCT 48.0  PLT 287   ------------------------------------------------------------------------------------------------------------------  Chemistries  Recent Labs  Lab 01/01/19 1129  NA 142  K 3.5  CL 107  CO2 28  GLUCOSE 104*  BUN 15  CREATININE 0.87  CALCIUM 8.9  AST 15  ALT 15  ALKPHOS 55  BILITOT 0.7   ------------------------------------------------------------------------------------------------------------------  Cardiac Enzymes No results for input(s): TROPONINI in the last 168 hours. ------------------------------------------------------------------------------------------------------------------  RADIOLOGY:  Ct Head Wo Contrast  Result Date: 01/01/2019 CLINICAL DATA:  Possible stroke. Focal neuro deficit which is not specified EXAM: CT HEAD WITHOUT CONTRAST TECHNIQUE: Contiguous axial images were obtained from the base of the skull through the vertex without intravenous contrast. COMPARISON:  None. FINDINGS: Brain: 4 cm mass the right frontal parietal junction which is relatively high density and could be meningioma but it is unclear if the mass is intra or extra-axial. There is local cerebral mass effect with mild white matter low-density. No acute hemorrhage, hydrocephalus, or collection. Dural calcifications along the tentorium. Vascular: Scattered atherosclerotic calcifications. Skull: No acute or aggressive finding. No erosion or hyperostosis seen at the level of the mass. Sinuses/Orbits: Negative IMPRESSION: 4 cm mass at the right parasagittal vertex which could be intra or extra-axial. There is local mass effect with mild brain edema, recommend MRI with contrast if possible. Electronically Signed   By: Monte Fantasia M.D.   On: 01/01/2019  11:47   Mr [name] And Wo Contrast  Result Date: 01/01/2019 CLINICAL DATA:  Left-sided weakness and headache. Brain mass on CT. EXAM: MRI HEAD WITHOUT AND WITH CONTRAST TECHNIQUE: Multiplanar, multiecho pulse sequences of the brain and surrounding structures were obtained without and with intravenous contrast. CONTRAST:  18mL GADAVIST GADOBUTROL 1 MMOL/ML IV SOLN COMPARISON:  Head CT 01/01/2019 FINDINGS: Brain: Small acute infarcts are present in the right paramedian pons and right cerebellum including middle cerebellar peduncle. There are also additional small right cerebellar infarcts which are more subacute to chronic in appearance. There is minimal enhancement associated with some of the right cerebellar infarcts. A homogeneously enhancing parafalcine mass in the right parietal region measures 2.8 x 2.8 x 3.1 cm (AP x transverse x craniocaudal) and appears to be extra-axial. There is local mass effect on the right parietal lobe with mild vasogenic edema in the white matter. No second enhancing intracranial lesion is identified. The ventricles are normal in size. Small foci of T2 hyperintensity in the cerebral white matter bilaterally are nonspecific but compatible with mild chronic small vessel ischemic disease. There is a small chronic cortical infarct in the right frontal operculum. Vascular: Major intracranial vascular flow voids are preserved. Mild generalized intracranial arterial dolichoectasia. Skull and upper cervical spine: Unremarkable bone marrow signal. Sinuses/Orbits: Unremarkable orbits. Clear paranasal sinuses. Trace left mastoid fluid or edema. Other: None. IMPRESSION: 1. 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 2. 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 3. 2.8 cm right parietal parafalcine extra-axial mass consistent with a meningioma.  Mild vasogenic edema and local mass effect. 4. Mild chronic small vessel ischemic disease. Electronically Signed    By: Logan Bores M.D.   On: 01/01/2019 16:29      IMPRESSION AND PLAN:   Acute CVA. The patient will be admitted to medical floor. Continue aspirin, start Lipitor, neuro check, follow-up echocardiograph, carotid duplex and neurology consult.  Speech, PT OT evaluation. Informed tele neurology. They do not do routine consult. Meningioma.  Continue Decadron, follow-up neurosurgeon Dr. Lacinda Axon. Hypertension malignancy.  Start Lopressor and hydralazine p.o.  IV Lopressor as needed. Tobacco abuse.  Smoking cessation was counseled for 3 to 4 minutes, nicotine patch.  All the records are reviewed and case discussed with ED provider. Management plans discussed with the patient, family and they are in agreement.  CODE STATUS:    TOTAL TIME TAKING CARE OF THIS PATIENT: 58  minutes.    Demetrios Loll M.D on 01/01/2019 at 5:51 PM  Between 7am to 6pm - Pager - [phone]  After 6pm go to [email or web address] - Proofreader  [facility]  Office  [phone]  CC: Primary care physician; Patient, No Pcp Per   Note: This dictation was prepared with Dragon dictation along with smaller phrase technology. Any transcriptional errors that result from this process are unin

## 2019-01-01 NOTE — ED Notes (Signed)
Patient transported to MRI 

## 2019-01-01 NOTE — ED Provider Notes (Signed)
[facility] Emergency Department Provider Note  Time seen: 2:11 PM  I have reviewed the triage vital signs and the nursing notes.   HISTORY  Chief Complaint Hypertension and Numbness   HPI Paul Leonard is a 58 y.o. male with a past medical history of hypertension presents to the emergency department for left-sided weakness and headache.  According to the patient over the past 5 days he has been experiencing a headache, states moderate severity intermittent coming and going.  Patient also states since Saturday he has been experiencing left leg and left arm weakness with intermittent numbness.  No history of stroke previously.  Patient does admit to cocaine use last use was 3 days ago.  Patient has been having difficulty ambulating due to the left leg weakness.  Denies any fever cough or shortness of breath.   Past Medical History:  Diagnosis Date  . Hypertension     There are no active problems to display for this patient.   History reviewed. No pertinent surgical history.  Prior to Admission medications   Medication Sig Start Date End Date Taking? Authorizing Provider  amoxicillin (AMOXIL) 500 MG tablet Take 1 tablet (500 mg total) by mouth 3 (three) times daily. 12/22/14   Triplett, Cari B, FNP  traMADol (ULTRAM) 50 MG tablet Take 1 tablet (50 mg total) by mouth every 6 (six) hours as needed. 12/22/14   Triplett, Johnette Abraham B, FNP    No Known Allergies  History reviewed. No pertinent family history.  Social History Social History   Tobacco Use  . Smoking status: Current Every Day Smoker    Packs/day: 1.00    Types: Cigarettes  . Smokeless tobacco: Never Used  Substance Use Topics  . Alcohol use: Yes    Alcohol/week: 1.0 standard drinks    Types: 1 Cans of beer per week  . Drug use: Yes    Types: Cocaine    Comment: Last used 10/16    Review of Systems Constitutional: Negative for fever Cardiovascular: Negative for chest pain. Respiratory:  Negative for shortness of breath. Gastrointestinal: Negative for abdominal pain Musculoskeletal: Negative for musculoskeletal complaints Neurological: Intermittent headaches.  Left-sided weakness. All other ROS negative  ____________________________________________   PHYSICAL EXAM:  VITAL SIGNS: ED Triage Vitals  Enc Vitals Group     BP 01/01/19 1123 (!) 174/88     Pulse Rate 01/01/19 1123 72     Resp 01/01/19 1123 16     Temp 01/01/19 1123 98.4 F (36.9 C)     Temp Source 01/01/19 1123 Oral     SpO2 01/01/19 1123 99 %     Weight 01/01/19 1122 185 lb (83.9 kg)     Height 01/01/19 1122 5\' 10"  (1.778 m)     Head Circumference --      Peak Flow --      Pain Score 01/01/19 1122 0     Pain Loc --      Pain Edu? --      Excl. in [place]? --    Constitutional: Alert and oriented. Well appearing and in no distress. Eyes: Normal exam ENT      Head: Normocephalic and atraumatic.      Mouth/Throat: Mucous membranes are moist. Cardiovascular: Normal rate, regular rhythm.  Respiratory: Normal respiratory effort without tachypnea nor retractions. Breath sounds are clear  Gastrointestinal: Soft and nontender. No distention.   Musculoskeletal: Nontender with normal range of motion in all extremities.  Neurologic: Slightly slurred speech compared to normal  per wife.  No facial droop.  Cranial nerves appear intact.  Patient states sensation is equal and intact in both upper and lower extremities.  Patient has diminished grip strength in the left upper extremity with a moderate pronator drift.  Diminished strength in left lower extremity with significant drift as well. Skin:  Skin is warm, dry and intact.  Psychiatric: Mood and affect are normal.   ____________________________________________    EKG  EKG viewed and interpreted by myself shows a sinus rhythm at 73 bpm with a narrow QRS, normal axis, normal intervals, nonspecific ST changes.  ____________________________________________     RADIOLOGY  CT shows 4 cm mass in the parasagittal vertex.  ____________________________________________   INITIAL IMPRESSION / ASSESSMENT AND PLAN / ED COURSE  Pertinent labs & imaging results that were available during my care of the patient were reviewed by me and considered in my medical decision making (see chart for details).   Patient presents to the emergency department for left-sided weakness over the past 3 days and headache over the past 5 days.  Patient CT scan unfortunately shows a 4 cm mass with mild surrounding edema.  I discussed the patient with Dr. Lacinda Axon of neurosurgery recommends MRI with contrast to further evaluate, 4 mg of IV Decadron.  We will reconsult with Dr. Lacinda Axon once the MRI has resulted.  Patient care signed out to oncoming physician.  Paul Leonard was evaluated in Emergency Department on 01/01/2019 for the symptoms described in the history of present illness. He was evaluated in the context of the global COVID-19 pandemic, which necessitated consideration that the patient might be at risk for infection with the SARS-CoV-2 virus that causes COVID-19. Institutional protocols and algorithms that pertain to the evaluation of patients at risk for COVID-19 are in a state of rapid change based on information released by regulatory bodies including the CDC and federal and state organizations. These policies and algorithms were followed during the patient's care in the ED.  ____________________________________________   FINAL CLINICAL IMPRESSION(S) / ED DIAGNOSES  Left-sided weakness Brain mass   Harvest Dark, MD 01/01/19 1455

## 2019-01-01 NOTE — ED Provider Notes (Signed)
Assumed care from Dr. Kerman Passey at 3 PM. Briefly, the patient is a 58 y.o. male with PMHx of  has a past medical history of Hypertension. here with headache, transient weakness. Imaging shows 4 cm mass right parsagittal vertex w/ mild brain edema. Dr. Lacinda Axon of [place] consulted - MRI ordered with decadron PO. Plan to f/u and discuss w/ Dr. Lacinda Axon.  Labs Reviewed  COMPREHENSIVE METABOLIC PANEL - Abnormal; Notable for the following components:      Result Value   Glucose, Bld 104 (*)    All other components within normal limits  PROTIME-INR  APTT  CBC  DIFFERENTIAL    Course of Care: Reviewed MRI, which does confirm meningioma, but also shows acute infarcts.  Patient will be admitted for stroke work-up.  Neurology and neurosurgery have been consulted. -MRI concerning for acute stroke.  Will give aspirin, plan to admit.    Duffy Bruce, MD 01/01/19 [phone]

## 2019-01-01 NOTE — ED Triage Notes (Signed)
Per pt girlfriend, pt began to feel dizzy last Wednesday and then Saturday he started with trouble walking and need help sitting up. EMS got a BP of 185, Pt girlfriend reports he also has blurred vision.

## 2019-01-02 ENCOUNTER — Inpatient Hospital Stay: Payer: Medicaid Other

## 2019-01-02 ENCOUNTER — Encounter: Payer: Self-pay | Admitting: Radiology

## 2019-01-02 ENCOUNTER — Inpatient Hospital Stay (HOSPITAL_COMMUNITY)
Admit: 2019-01-02 | Discharge: 2019-01-02 | Disposition: A | Discharge status: Home / Self Care | Payer: Medicaid Other | Attending: Internal Medicine | Admitting: Internal Medicine

## 2019-01-02 DIAGNOSIS — I639 Cerebral infarction, unspecified: Secondary | ICD-10-CM

## 2019-01-02 DIAGNOSIS — I6389 Other cerebral infarction: Secondary | ICD-10-CM

## 2019-01-02 LAB — HIV ANTIBODY (ROUTINE TESTING W REFLEX): HIV Screen 4th Generation wRfx: NONREACTIVE

## 2019-01-02 LAB — ECHOCARDIOGRAM COMPLETE
Height: 70 in
Weight: 2960 oz

## 2019-01-02 LAB — LIPID PANEL
Cholesterol: 191 mg/dL (ref 0–200)
HDL: 51 mg/dL (ref >40)
LDL Cholesterol: 133 mg/dL — ABNORMAL HIGH (ref 0–99)
Total CHOL/HDL Ratio: 3.7 RATIO
Triglycerides: 35 mg/dL (ref <150)
VLDL: 7 mg/dL (ref 0–40)

## 2019-01-02 LAB — SARS CORONAVIRUS 2 (TAT 6-24 HRS): SARS Coronavirus 2: NEGATIVE

## 2019-01-02 MED ORDER — DEXAMETHASONE 4 MG PO TABS
4.0000 mg | ORAL_TABLET | Freq: Three times a day (TID) | ORAL | Status: DC
  Administered 2019-01-02 – 2019-01-04 (×5): 4 mg via ORAL
  Filled 2019-01-02 (×4): qty 1

## 2019-01-02 MED ORDER — LABETALOL HCL 5 MG/ML IV SOLN
5.0000 mg | Freq: Four times a day (QID) | INTRAVENOUS | Status: DC | PRN
  Administered 2019-01-02: 5 mg via INTRAVENOUS
  Filled 2019-01-02: qty 4

## 2019-01-02 MED ORDER — LISINOPRIL 10 MG PO TABS
10.0000 mg | ORAL_TABLET | Freq: Every day | ORAL | Status: DC
  Administered 2019-01-02 – 2019-01-03 (×2): 10 mg via ORAL
  Filled 2019-01-02: qty 1

## 2019-01-02 MED ORDER — ASPIRIN 81 MG PO CHEW
81.0000 mg | CHEWABLE_TABLET | Freq: Every day | ORAL | Status: DC
  Administered 2019-01-03 – 2019-01-04 (×2): 81 mg via ORAL
  Filled 2019-01-02 (×2): qty 1

## 2019-01-02 MED ORDER — IOHEXOL 350 MG/ML SOLN
75.0000 mL | Freq: Once | INTRAVENOUS | Status: AC | PRN
  Administered 2019-01-02: 75 mL via INTRAVENOUS

## 2019-01-02 MED ORDER — LABETALOL HCL 5 MG/ML IV SOLN
10.0000 mg | Freq: Four times a day (QID) | INTRAVENOUS | Status: DC | PRN
  Administered 2019-01-02 – 2019-01-03 (×2): 10 mg via INTRAVENOUS
  Filled 2019-01-02 (×2): qty 4

## 2019-01-02 MED ORDER — HYDRALAZINE HCL 10 MG PO TABS
10.0000 mg | ORAL_TABLET | Freq: Once | ORAL | Status: AC
  Administered 2019-01-02: 10 mg via ORAL
  Filled 2019-01-02: qty 1

## 2019-01-02 MED ORDER — DEXAMETHASONE 4 MG PO TABS: 4.0000 mg | ORAL_TABLET | Freq: Three times a day (TID) | ORAL | Status: DC

## 2019-01-02 MED ORDER — CLOPIDOGREL BISULFATE 75 MG PO TABS
75.0000 mg | ORAL_TABLET | Freq: Every day | ORAL | Status: DC
  Administered 2019-01-03 – 2019-01-04 (×2): 75 mg via ORAL
  Filled 2019-01-02 (×2): qty 1

## 2019-01-02 NOTE — Progress Notes (Signed)
PT Cancellation Note  Patient Details Name: Paul Leonard MRN: [id number] DOB: 24-May-1960   Cancelled Treatment:    Reason Eval/Treat Not Completed: Patient not medically ready.  PT consult received.  Chart reviewed.  Discussed pt with pt's nurse who reports recommendation to hold PT at this time (per nurses discussion with MD d/t pt's elevated BP concerns--plan to treat with medication to improve BP).  Will hold PT at this time and re-attempt PT evaluation at a later date/time as medically appropriate.  Leitha Bleak, PT 01/02/19, 12:10 PM [phone]

## 2019-01-02 NOTE — Progress Notes (Signed)
PT called and said patients BP was 196/117 and wanted to know if she could still walk him? Messaged Dr. Brett Albino and she said No let's go ahead and give him a dose of the IV labetalol and ask them to come back when his BP is better.

## 2019-01-02 NOTE — Progress Notes (Addendum)
SLP Cancellation Note  Patient Details Name: Paul Leonard MRN: [id number] DOB: 05-31-1960   Cancelled treatment:       Reason Eval/Treat Not Completed: (chart reviewed; consulted NSG then met w/ pt) Pt denied any difficulty swallowing and is currently on a regular diet; tolerates swallowing pills w/ water per NSG report. Pt conversed w/ SLP w/out overt deficits noted; pt and NSG denied any overt speech-language deficits -- he has been answering general questions for NSG appropriately this morning. Speech was intelligible during his responses. Noted MRI results indicating a "Small acute infarcts in the right pons and right cerebellum with additional small subacute to chronic right cerebellar infarcts and a 2.8 cm right parietal parafalcine extra-axial mass consistent with a meningioma -- Mild vasogenic edema and local mass effect. There is Mild chronic small vessel ischemic disease."  Also noted per chart/MD notes substance abuse issues: "Pt last used cocaine Friday 10/16". Suspect pt would benefit from more formal Cognitive-linguistic assessment at discharge to determine if any changes from his baseline functioning during ADLs.  NSG and CM updated. NSG to reconsult if any changes during this admission.      Orinda Kenner, MS, CCC-SLP Ihan Pat 01/02/2019, 12:17 PM

## 2019-01-02 NOTE — TOC Initial Note (Signed)
Transition of Care [facility]) - Initial/Assessment Note    Patient Details  Name: Paul Leonard MRN: [id number] Date of Birth: 04/05/1960  [facility]) CM/SW Contact:    Shelbie Hutching, RN Phone Number: 01/02/2019, 1:49 PM  Clinical Narrative:                 Patient admitted for stroke, patient started having symptoms last week on Thursday.  Patient is from home where he lives with his girlfriend.  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  Patient does not drive, he and his girlfriend do not have a car.  A coworker picks him up for work and any other transportation is provided by his sister.  Patient does not have insurance and does not have a primary care provider.  Patient gives his permission for [facility] to attempt to schedule PCP appointment at [facility].  Patient reports that [facility] is convenient to where he lives.   OT has recommended home health OT, RNCM will need to set up PCP services before arranging home health services.  1409:  RNCM was able to schedule patient with [facility] for next week Oct. 27th at [place] had sooner availability than [facility], patient is okay with this, both clinics are in the [facility] system.   Expected Discharge Plan: [place] Barriers to Discharge: Continued Medical Work up   Patient Goals and CMS Choice   CMS Medicare.gov Compare Post Acute Care list provided to:: Patient Choice offered to / list presented to : Patient  Expected Discharge Plan and Services Expected Discharge Plan: [place]   Discharge Planning Services: CM Consult, [facility] Post Acute Care Choice: [place] arrangements for the past 2 months: Apartment                                      Prior Living Arrangements/Services Living arrangements for the past 2 months: Apartment Lives with:: Significant Other Patient  language and need for interpreter reviewed:: No Do you feel safe going back to the place where you live?: Yes      Need for Family Participation in Patient Care: Yes (Comment)(acute stroke) Care giver support system in place?: Yes (comment)(girlfriend)   Criminal Activity/Legal Involvement Pertinent to Current Situation/Hospitalization: No - Comment as needed  Activities of Daily Living Home Assistive Devices/Equipment: None ADL Screening (condition at time of admission) Patient's cognitive ability adequate to safely complete daily activities?: Yes Is the patient deaf or have difficulty hearing?: No Does the patient have difficulty seeing, even when wearing glasses/contacts?: No Does the patient have difficulty concentrating, remembering, or making decisions?: No Patient able to express need for assistance with ADLs?: Yes Does the patient have difficulty dressing or bathing?: No Independently performs ADLs?: Yes (appropriate for developmental age) Does the patient have difficulty walking or climbing stairs?: Yes Weakness of Legs: Left Weakness of Arms/Hands: Left  Permission Sought/Granted Permission sought to share information with : Case Manager, Customer service manager, Other (comment) Permission granted to share information with : Yes, Verbal Permission Granted     Permission granted to share info w AGENCY: [place]        Emotional Assessment Appearance:: Appears stated age Attitude/Demeanor/Rapport: Engaged Affect (typically observed): Accepting Orientation: : Oriented to Self, Oriented to  Place, Oriented to  Time, Oriented to Situation Alcohol / Substance Use: Illicit Drugs, Alcohol Use Psych Involvement: No (comment)  Admission diagnosis:  Brain mass [G93.89] Cerebrovascular accident (CVA) due to thrombosis of precerebral artery ([place]) [I63.00] Patient Active Problem List   Diagnosis Date Noted  . Acute CVA (cerebrovascular accident)  ([place]) 01/01/2019   PCP:  Patient, No Pcp Per Pharmacy:  No Pharmacies Listed    Social Determinants of Health (SDOH) Interventions    Readmission Risk Interventions No flowsheet data found.

## 2019-01-02 NOTE — Progress Notes (Signed)
OT Cancellation Note  Patient Details Name: Paul Leonard MRN: [id number] DOB: 04-10-1960   Cancelled Treatment:    Reason Eval/Treat Not Completed: Patient at procedure or test/ unavailable. Order received, chart reviewed. Pt out of room for testing. Will re-attempt OT evaluation at later date/time as pt is available and medically appropriate.  Jeni Salles, MPH, MS, OTR/L ascom [phone] 01/02/19, 10:15 AM

## 2019-01-02 NOTE — Evaluation (Addendum)
Occupational Therapy Evaluation Patient Details Name: Paul Leonard MRN: [id number] DOB: 07-27-1960 Today's Date: 01/02/2019    History of Present Illness 58 y.o. male with a known history of hypertension.  The patient presented ED with above chief complaint.  The patient started to have left arm and leg weakness and numbness 3 days ago.  He also complains of headache, dizziness, speech changes.  He denies any incontinence or loss of consciousness. MRI brain report acute infarct and Meningioma   Clinical Impression   Pt seen for OT evaluation this date. Prior to hospital admission, pt was independent in all aspects of ADL. Pt reports family does cooking, cleaning, and driving. Pt endorses 1 fall right before admission when he started feeling weak on L side. Pt lives with his girlfriend in a 1 story apartment with level entry back entrance. Currently pt demonstrates impairments in LUE/LLE strength and coordination impairing pt's balance. Pt reports intermittent mild blurry vision but denied difficulty with watching TV or reading from lunch menu. BP elevated during session, RN notified and OOB mobility deferred. Pt reported feeling well throughout session and reported that his symptoms appeared to be improving. Pt instructed in [facility] ex for LUE. Pt required CGA for bed mobility with mgt of LLE. MD in room to assess at end of session. Pt will benefit from skilled OT services to maximize return to PLOF. Recommend OP OT pending pt's ability to get transportation since he does not drive at baseline (may benefit from [facility] services if OP is unrealistic due to transportation difficulties). RNCM notified.    Follow Up Recommendations  Outpatient OT;Home health OT    Equipment Recommendations  3 in 1 bedside commode    Recommendations for Other Services       Precautions / Restrictions Precautions Precautions: Fall Restrictions Weight Bearing Restrictions: No      Mobility Bed Mobility Overal bed  mobility: Needs Assistance Bed Mobility: Supine to Sit;Sit to Supine     Supine to sit: Min guard Sit to supine: Min guard   General bed mobility comments: LLE mgt, additional time/effort  Transfers Overall transfer level: Needs assistance Equipment used: None Transfers: Sit to/from Stand Sit to Stand: Min guard         General transfer comment: pt stood prior to OT's request, no LOB but appeared unsteady 2/2 L side weakness    Balance Overall balance assessment: Needs assistance Sitting-balance support: Single extremity supported;Feet supported Sitting balance-Leahy Scale: Fair     Standing balance support: No upper extremity supported Standing balance-Leahy Scale: Fair                             ADL either performed or assessed with clinical judgement   ADL Overall ADL's : Needs assistance/impaired                                       General ADL Comments: modified indep with seated grooming and self feeding tasks; CGA to Min A for LB ADL tasks, set up and supervision for seated UB dressing/bathing     Vision Baseline Vision/History: Wears glasses Wears Glasses: Reading only Patient Visual Report: Blurring of vision Vision Assessment?: Yes Eye Alignment: Within Functional Limits Ocular Range of Motion: Within Functional Limits Alignment/Gaze Preference: Within Defined Limits Tracking/Visual Pursuits: Able to track stimulus in all quads without difficulty Convergence: Other (  comment)(R eye appears WFL, difficult to tell whether L eye was converging equally) Visual Fields: No apparent deficits     Perception     Praxis      Pertinent Vitals/Pain Pain Assessment: 0-10 Pain Score: 7  Pain Location: headache Pain Descriptors / Indicators: Headache Pain Intervention(s): Limited activity within patient's tolerance;Monitored during session     Hand Dominance Right   Extremity/Trunk Assessment Upper Extremity Assessment Upper  Extremity Assessment: LUE deficits/detail(RUE WFL) LUE Deficits / Details: hx L RTC injury with resulting limited shoulder ROM; grossly 4-/5 otherwise, denies sensory deficits, decr FMC with finger to nose and thumb opposition testing LUE Sensation: WNL LUE Coordination: decreased fine motor;decreased gross motor   Lower Extremity Assessment Lower Extremity Assessment: LLE deficits/detail(RLE WFL) LLE Deficits / Details: grossly 4-/5, denies sensory deficits, impaired coordination with heel to shin testing LLE Sensation: WNL LLE Coordination: decreased fine motor;decreased gross motor   Cervical / Trunk Assessment Cervical / Trunk Assessment: Normal   Communication Communication Communication: Expressive difficulties(slight slurred speech occasionally)   Cognition Arousal/Alertness: Awake/alert Behavior During Therapy: WFL for tasks assessed/performed Overall Cognitive Status: Within Functional Limits for tasks assessed                                 General Comments: alert and oriented, follows commands   General Comments  BP at start of session in supine 196/117, 178/110; seated EOB 199/113, 194/109. Spoke to RN by phone regarding BP and OOB attempts. RN messaged MD for guidance and said she would call OT back. Prior to laying back down, pt stood to readjust gown. Pt denied symptoms throughout session.    Exercises Other Exercises Other Exercises: Pt instructed in [facility] ex for LUE to improve strength/coordination.   Shoulder Instructions      Home Living Family/patient expects to be discharged to:: Private residence Living Arrangements: Spouse/significant other(girlfriend) Available Help at Discharge: Family;Available 24 hours/day Type of Home: Apartment Home Access: Level entry(no steps to back entrance)     Home Layout: One level     Bathroom Shower/Tub: Teacher, early years/pre: Standard     Home Equipment: None          Prior  Functioning/Environment Level of Independence: Independent        Comments: Pt indep with mobility, ADL; family drives, cooks, cleans; 1 fall in past 12 months that occurred just prior to admission when pt began to feel weak on the L side        OT Problem List: Decreased strength;Decreased coordination;Pain;Cardiopulmonary status limiting activity;Impaired balance (sitting and/or standing);Decreased safety awareness;Impaired vision/perception;Impaired UE functional use;Decreased knowledge of use of DME or AE      OT Treatment/Interventions: Self-care/ADL training;Therapeutic exercise;Therapeutic activities;Neuromuscular education;Visual/perceptual remediation/compensation;DME and/or AE instruction;Patient/family education;Balance training    OT Goals(Current goals can be found in the care plan section) Acute Rehab OT Goals Patient Stated Goal: go home OT Goal Formulation: With patient Time For Goal Achievement: 02/04/2019 Potential to Achieve Goals: Good ADL Goals Pt Will Perform Lower Body Dressing: sit to/from stand;with supervision Pt Will Transfer to Toilet: with supervision;ambulating;regular height toilet(LRAD for amb) Additional ADL Goal #1: Pt will perform ex program independently to improve LUE Strength and fine motor coordination.  OT Frequency: Min 2X/week   Barriers to D/C:            Co-evaluation  AM-PAC OT "6 Clicks" Daily Activity     Outcome Measure Help from another person eating meals?: None Help from another person taking care of personal grooming?: None Help from another person toileting, which includes using toliet, bedpan, or urinal?: A Little Help from another person bathing (including washing, rinsing, drying)?: A Little Help from another person to put on and taking off regular upper body clothing?: None Help from another person to put on and taking off regular lower body clothing?: A Little 6 Click Score: 21   End of Session  Equipment Utilized During Treatment: Gait belt Nurse Communication: Other (comment)(BP)  Activity Tolerance: Patient tolerated treatment well Patient left: in bed;with call bell/phone within reach;with bed alarm set;Other (comment)(MD in room)  OT Visit Diagnosis: Other abnormalities of gait and mobility (R26.89);Hemiplegia and hemiparesis;Low vision, both eyes (H54.2) Hemiplegia - Right/Left: Left Hemiplegia - dominant/non-dominant: Non-Dominant Hemiplegia - caused by: Cerebral infarction                Time: 1130-1159 OT Time Calculation (min): 29 min Charges:  OT General Charges $OT Visit: 1 Visit OT Evaluation $OT Eval Moderate Complexity: 1 Mod OT Treatments $Neuromuscular Re-education: 8-22 mins  Jeni Salles, MPH, MS, OTR/L ascom [phone] 01/02/19, 1:07 PM

## 2019-01-02 NOTE — Progress Notes (Signed)
*  PRELIMINARY RESULTS* Echocardiogram 2D Echocardiogram has been performed.  Sherrie Sport 01/02/2019, 2:07 PM

## 2019-01-02 NOTE — Progress Notes (Signed)
[facility] [place] NAME: Paul Leonard    MR#:  [id number]  DATE OF BIRTH:  [birth year]  SUBJECTIVE:   Patient states he is feeling a little bit better today.  His left arm and leg numbness and weakness have improved.  He also feels like his speech does not sound is slurred.  He endorses a mild frontal headache.  No vision changes.  REVIEW OF SYSTEMS:  Review of Systems  Constitutional: Negative for chills and fever.  HENT: Negative for congestion and sore throat.   Eyes: Negative for blurred vision and double vision.  Respiratory: Negative for cough and shortness of breath.   Cardiovascular: Negative for chest pain and palpitations.  Gastrointestinal: Negative for nausea and vomiting.  Genitourinary: Negative for dysuria and urgency.  Musculoskeletal: Negative for back pain and neck pain.  Neurological: Positive for sensory change, speech change, focal weakness and headaches. Negative for tingling and seizures.  Psychiatric/Behavioral: Negative for depression. The patient is not nervous/anxious.     DRUG ALLERGIES:  No Known Allergies VITALS:  Blood pressure (!) 203/137, pulse 76, temperature 98.2 F (36.8 C), temperature source Oral, resp. rate 18, height 5\' 10"  (1.778 m), weight 83.9 kg, SpO2 100 %. PHYSICAL EXAMINATION:  Physical Exam  GENERAL:  Laying in the bed with no acute distress.  HEENT: Head atraumatic, normocephalic. Pupils equal, round, reactive to light and accommodation. No scleral icterus. Extraocular muscles intact. Oropharynx and nasopharynx clear.  NECK:  Supple, no jugular venous distention. No thyroid enlargement. LUNGS: Lungs are clear to auscultation bilaterally. No wheezes, crackles, rhonchi. No use of accessory muscles of respiration.  CARDIOVASCULAR: RRR, S1, S2 normal. No murmurs, rubs, or gallops.  ABDOMEN: Soft, nontender, nondistended. Bowel sounds present.  EXTREMITIES: No pedal edema, cyanosis, or  clubbing.  NEUROLOGIC: CN 2-12 intact, no focal deficits. 5/5 muscle strength in the right upper extremity and right lower extremity.  4/5 muscle strength in the left upper extremity and left lower extremity. + Diminished sensation to light touch in the left arm and leg. Gait not checked.  PSYCHIATRIC: The patient is alert and oriented x 3.  SKIN: No obvious rash, lesion, or ulcer.  LABORATORY PANEL:  Male CBC Recent Labs  Lab 01/01/19 1129  WBC 4.5  HGB 15.1  HCT 48.0  PLT 287   ------------------------------------------------------------------------------------------------------------------ Chemistries  Recent Labs  Lab 01/01/19 1129  NA 142  K 3.5  CL 107  CO2 28  GLUCOSE 104*  BUN 15  CREATININE 0.87  CALCIUM 8.9  AST 15  ALT 15  ALKPHOS 55  BILITOT 0.7   RADIOLOGY:  Mr [name] And Wo Contrast  Result Date: 01/01/2019 CLINICAL DATA:  Left-sided weakness and headache. Brain mass on CT. EXAM: MRI HEAD WITHOUT AND WITH CONTRAST TECHNIQUE: Multiplanar, multiecho pulse sequences of the brain and surrounding structures were obtained without and with intravenous contrast. CONTRAST:  78mL GADAVIST GADOBUTROL 1 MMOL/ML IV SOLN COMPARISON:  Head CT 01/01/2019 FINDINGS: Brain: Small acute infarcts are present in the right paramedian pons and right cerebellum including middle cerebellar peduncle. There are also additional small right cerebellar infarcts which are more subacute to chronic in appearance. There is minimal enhancement associated with some of the right cerebellar infarcts. A homogeneously enhancing parafalcine mass in the right parietal region measures 2.8 x 2.8 x 3.1 cm (AP x transverse x craniocaudal) and appears to be extra-axial. There is local mass effect on the right parietal lobe with mild vasogenic  edema in the white matter. No second enhancing intracranial lesion is identified. The ventricles are normal in size. Small foci of T2 hyperintensity in the cerebral white  matter bilaterally are nonspecific but compatible with mild chronic small vessel ischemic disease. There is a small chronic cortical infarct in the right frontal operculum. Vascular: Major intracranial vascular flow voids are preserved. Mild generalized intracranial arterial dolichoectasia. Skull and upper cervical spine: Unremarkable bone marrow signal. Sinuses/Orbits: Unremarkable orbits. Clear paranasal sinuses. Trace left mastoid fluid or edema. Other: None. IMPRESSION: 1. 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 2. 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 3. 2.8 cm right parietal parafalcine extra-axial mass consistent with a meningioma. Mild vasogenic edema and local mass effect. 4. Mild chronic small vessel ischemic disease. Electronically Signed   By: Logan Bores M.D.   On: 01/01/2019 16:29   US Carotid Bilateral (at [facility] Only)  Result Date: 01/02/2019 CLINICAL DATA:  58 year old male with stroke-like symptoms EXAM: BILATERAL CAROTID DUPLEX ULTRASOUND TECHNIQUE: Pearline Cables scale imaging, color Doppler and duplex ultrasound were performed of bilateral carotid and vertebral arteries in the neck. COMPARISON:  None. FINDINGS: Criteria: Quantification of carotid stenosis is based on velocity parameters that correlate the residual internal carotid diameter with NASCET-based stenosis levels, using the diameter of the distal internal carotid lumen as the denominator for stenosis measurement. The following velocity measurements were obtained: RIGHT ICA: 94/26 cm/sec CCA: [id number] cm/sec SYSTOLIC ICA/CCA RATIO:  0.9 ECA:  113 cm/sec LEFT ICA: 80/29 cm/sec CCA: [id number] cm/sec SYSTOLIC ICA/CCA RATIO:  0.8 ECA:  88 cm/sec RIGHT CAROTID ARTERY: Trace smooth heterogeneous atherosclerotic plaque in the distal common carotid artery and proximal internal carotid artery. By peak systolic velocity criteria, the estimated stenosis remains less than 50%. RIGHT VERTEBRAL ARTERY:   Patent with normal antegrade flow. LEFT CAROTID ARTERY: Mild smooth heterogeneous atherosclerotic plaque in the proximal internal carotid artery. By peak systolic velocity criteria, the estimated stenosis remains less than 50%. LEFT VERTEBRAL ARTERY:  Patent with normal antegrade flow. IMPRESSION: 1. Mild (1-49%) stenosis proximal right internal carotid artery secondary to heterogenous atherosclerotic plaque. 2. Mild (1-49%) stenosis proximal left internal carotid artery secondary to heterogenous atherosclerotic plaque. 3. Vertebral arteries remain patent with normal antegrade flow. Signed, Criselda Peaches, MD, [facility] Radiology Electronically Signed   By: Jacqulynn Cadet M.D.   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 Lacinda Axon as an outpatient in 4-6 weeks  Uncontrolled hypertension- BP remains elevated today.   -Holding home hydralazine, as it can worsen cerebral edema -Start lisinopril 10mg  daily -Continue home metoprolol -IV labetalol prn -Goal BP 150-160 for now  Tobacco use -Continue nicotine patch  Likely discharge home tomorrow.  All the records are reviewed and case discussed with Care Management/Social Worker. Management plans discussed with the patient, family and they are in agreement.  CODE STATUS: Full Code  TOTAL TIME TAKING CARE OF THIS PATIENT: 40 minutes.   More than 50% of the time was spent in counseling/coordination of care: YES  POSSIBLE D/C IN 1-2 DAYS, DEPENDING ON CLINICAL CONDITION.   Berna Spare Wilho Sharpley M.D on 01/02/2019 at 12:50 PM  Between 7am to 6pm - Pager [phone]  After 6pm go to [email or web address] - Proofreader  [facility]  Office  [phone]  CC: Primary care physician; Patient, No Pcp Per  Note: This dictation was prepared with Dragon dictation along with smaller phrase technology. Any transcriptional errors that result from this process are unintentional.

## 2019-01-02 NOTE — Consult Note (Signed)
Referring Physician: Mayo    Chief Complaint: Left sided weakness  HPI: Paul Leonard is an 58 y.o. male with a history of tobacco abuse and HTN who presents with complaints of left sided weakness.  Patient reports that his symptoms started on Thursday of last week.  By Friday he was dragging his left leg.  With no improvement in his symptoms he presented for evaluation on yesterday.  Initial NIHSS of 3.     Date last known well: Date: 12/28/2018 Time last known well: Time: 12:00 tPA Given: No: Outside time window  Past Medical History:  Diagnosis Date  . Hypertension     History reviewed. No pertinent surgical history.  History reviewed. No pertinent family history. Social History:  reports that he has been smoking cigarettes. He has been smoking about 1.00 pack per day. He has never used smokeless tobacco. He reports current alcohol use of about 1.0 standard drinks of alcohol per week. He reports current drug use. Drug: Cocaine.  Allergies: No Known Allergies  Medications:  I have reviewed the patient's current medications. Prior to Admission:  Medications Prior to Admission  Medication Sig Dispense Refill Last Dose  . amoxicillin (AMOXIL) 500 MG tablet Take 1 tablet (500 mg total) by mouth 3 (three) times daily. (Patient not taking: Reported on 01/01/2019) 30 tablet 0 Completed Course at Unknown time  . traMADol (ULTRAM) 50 MG tablet Take 1 tablet (50 mg total) by mouth every 6 (six) hours as needed. (Patient not taking: Reported on 01/01/2019) 9 tablet 0 Completed Course at Unknown time   Scheduled: . aspirin EC  325 mg Oral Daily  . atorvastatin  40 mg Oral q1800  . dexamethasone  4 mg Oral Q8H  . enoxaparin (LOVENOX) injection  40 mg Subcutaneous Q24H  . metoprolol tartrate  25 mg Oral BID  . nicotine  14 mg Transdermal Daily    ROS: History obtained from the patient  General ROS: negative for - chills, fatigue, fever, night sweats, weight gain or weight  loss Psychological ROS: negative for - behavioral disorder, hallucinations, memory difficulties, mood swings or suicidal ideation Ophthalmic ROS: negative for - blurry vision, double vision, eye pain or loss of vision ENT ROS: negative for - epistaxis, nasal discharge, oral lesions, sore throat, tinnitus or vertigo Allergy and Immunology ROS: negative for - hives or itchy/watery eyes Hematological and Lymphatic ROS: negative for - bleeding problems, bruising or swollen lymph nodes Endocrine ROS: negative for - galactorrhea, hair pattern changes, polydipsia/polyuria or temperature intolerance Respiratory ROS: negative for - cough, hemoptysis, shortness of breath or wheezing Cardiovascular ROS: negative for - chest pain, dyspnea on exertion, edema or irregular heartbeat Gastrointestinal ROS: negative for - abdominal pain, diarrhea, hematemesis, nausea/vomiting or stool incontinence Genito-Urinary ROS: negative for - dysuria, hematuria, incontinence or urinary frequency/urgency Musculoskeletal ROS: negative for - joint swelling or muscular weakness Neurological ROS: as noted in HPI Dermatological ROS: negative for rash and skin lesion changes  Physical Examination: Blood pressure (!) 189/124, pulse 81, temperature 98 F (36.7 C), resp. rate 18, height 5\' 10"  (1.778 m), weight 83.9 kg, SpO2 100 %.  HEENT-  Normocephalic, no lesions, without obvious abnormality.  Normal external eye and conjunctiva.  Normal TM's bilaterally.  Normal auditory canals and external ears. Normal external nose, mucus membranes and septum.  Normal pharynx. Cardiovascular- S1, S2 normal, pulses palpable throughout   Lungs- chest clear, no wheezing, rales, normal symmetric air entry Abdomen- soft, non-tender; bowel sounds normal; no masses,  no  organomegaly Extremities- no edema Lymph-no adenopathy palpable Musculoskeletal-no joint tenderness, deformity or swelling Skin-warm and dry, no hyperpigmentation, vitiligo, or  suspicious lesions  Neurological Examination   Mental Status: Alert, oriented, thought content appropriate.  Speech fluent without evidence of aphasia.  Mild dysarthria.   Able to follow 3 step commands without difficulty. Cranial Nerves: II: Discs flat bilaterally; Visual fields grossly normal, pupils equal, round, reactive to light and accommodation III,IV, VI: ptosis not present, extra-ocular motions intact bilaterally V,VII: mild left facial droop, facial light touch sensation normal bilaterally VIII: hearing normal bilaterally IX,X: gag reflex present XI: bilateral shoulder shrug XII: midline tongue extension Motor: Right : Upper extremity   5/5    Left:     Upper extremity   4/5  Lower extremity   5/5     Lower extremity   4/5 Tone and bulk:normal tone throughout; no atrophy noted Sensory: Pinprick and light touch intact throughout, bilaterally Deep Tendon Reflexes: Symmetric throughout Plantars: Right: mute   Left: mute Cerebellar: Normal finger-to-nose testing on the right.  Limitations noted on the left due to weakness.   Gait: not tested due to safety concerns   Laboratory Studies:  Basic Metabolic Panel: Recent Labs  Lab 01/01/19 1129  NA 142  K 3.5  CL 107  CO2 28  GLUCOSE 104*  BUN 15  CREATININE 0.87  CALCIUM 8.9    Liver Function Tests: Recent Labs  Lab 01/01/19 1129  AST 15  ALT 15  ALKPHOS 55  BILITOT 0.7  PROT 6.7  ALBUMIN 3.8   No results for input(s): LIPASE, AMYLASE in the last 168 hours. No results for input(s): AMMONIA in the last 168 hours.  CBC: Recent Labs  Lab 01/01/19 1129  WBC 4.5  NEUTROABS 2.5  HGB 15.1  HCT 48.0  MCV 88.7  PLT 287    Cardiac Enzymes: No results for input(s): CKTOTAL, CKMB, CKMBINDEX, TROPONINI in the last 168 hours.  BNP: Invalid input(s): POCBNP  CBG: No results for input(s): GLUCAP in the last 168 hours.  Microbiology: Results for orders placed or performed during the hospital encounter of  01/01/19  SARS CORONAVIRUS 2 (TAT 6-24 HRS) Nasopharyngeal Nasopharyngeal Swab     Status: None   Collection Time: 01/01/19  7:22 PM   Specimen: Nasopharyngeal Swab  Result Value Ref Range Status   SARS Coronavirus 2 NEGATIVE NEGATIVE Final    Comment: (NOTE) SARS-CoV-2 target nucleic acids are NOT DETECTED. The SARS-CoV-2 RNA is generally detectable in upper and lower respiratory specimens during the acute phase of infection. Negative results do not preclude SARS-CoV-2 infection, do not rule out co-infections with other pathogens, and should not be used as the sole basis for treatment or other patient management decisions. Negative results must be combined with clinical observations, patient history, and epidemiological information. The expected result is Negative. Fact Sheet for Patients: SugarRoll.be Fact Sheet for Healthcare Providers: https://www.woods-mathews.com/ This test is not yet approved or cleared by the [place] FDA and  has been authorized for detection and/or diagnosis of SARS-CoV-2 by FDA under an Emergency Use Authorization (EUA). This EUA will remain  in effect (meaning this test can be used) for the duration of the COVID-19 declaration under Section 56 4(b)(1) of the Act, 21 U.S.C. section 360bbb-3(b)(1), unless the authorization is terminated or revoked sooner. Performed at [facility], [place] [address] [place], [place] [id number]     Coagulation Studies: Recent Labs    01/01/19 1129  LABPROT 13.2  INR  1.0    Urinalysis: No results for input(s): COLORURINE, LABSPEC, PHURINE, GLUCOSEU, HGBUR, BILIRUBINUR, KETONESUR, PROTEINUR, UROBILINOGEN, NITRITE, LEUKOCYTESUR in the last 168 hours.  Invalid input(s): APPERANCEUR  Lipid Panel:    Component Value Date/Time   CHOL 191 01/02/2019 0427   TRIG 35 01/02/2019 0427   HDL 51 01/02/2019 0427   CHOLHDL 3.7 01/02/2019 0427   VLDL 7 01/02/2019 0427    LDLCALC 133 (H) 01/02/2019 0427    HgbA1C: No results found for: HGBA1C  Urine Drug Screen:  No results found for: LABOPIA, COCAINSCRNUR, LABBENZ, AMPHETMU, THCU, LABBARB  Alcohol Level: No results for input(s): ETH in the last 168 hours.  Other results: EKG: sinus rhythm at 73 bpm with premature atrial complexes  Imaging: Ct Head Wo Contrast  Result Date: 01/01/2019 CLINICAL DATA:  Possible stroke. Focal neuro deficit which is not specified EXAM: CT HEAD WITHOUT CONTRAST TECHNIQUE: Contiguous axial images were obtained from the base of the skull through the vertex without intravenous contrast. COMPARISON:  None. FINDINGS: Brain: 4 cm mass the right frontal parietal junction which is relatively high density and could be meningioma but it is unclear if the mass is intra or extra-axial. There is local cerebral mass effect with mild white matter low-density. No acute hemorrhage, hydrocephalus, or collection. Dural calcifications along the tentorium. Vascular: Scattered atherosclerotic calcifications. Skull: No acute or aggressive finding. No erosion or hyperostosis seen at the level of the mass. Sinuses/Orbits: Negative IMPRESSION: 4 cm mass at the right parasagittal vertex which could be intra or extra-axial. There is local mass effect with mild brain edema, recommend MRI with contrast if possible. Electronically Signed   By: Monte Fantasia M.D.   On: 01/01/2019 11:47   Mr [name] And Wo Contrast  Result Date: 01/01/2019 CLINICAL DATA:  Left-sided weakness and headache. Brain mass on CT. EXAM: MRI HEAD WITHOUT AND WITH CONTRAST TECHNIQUE: Multiplanar, multiecho pulse sequences of the brain and surrounding structures were obtained without and with intravenous contrast. CONTRAST:  34mL GADAVIST GADOBUTROL 1 MMOL/ML IV SOLN COMPARISON:  Head CT 01/01/2019 FINDINGS: Brain: Small acute infarcts are present in the right paramedian pons and right cerebellum including middle cerebellar peduncle. There are  also additional small right cerebellar infarcts which are more subacute to chronic in appearance. There is minimal enhancement associated with some of the right cerebellar infarcts. A homogeneously enhancing parafalcine mass in the right parietal region measures 2.8 x 2.8 x 3.1 cm (AP x transverse x craniocaudal) and appears to be extra-axial. There is local mass effect on the right parietal lobe with mild vasogenic edema in the white matter. No second enhancing intracranial lesion is identified. The ventricles are normal in size. Small foci of T2 hyperintensity in the cerebral white matter bilaterally are nonspecific but compatible with mild chronic small vessel ischemic disease. There is a small chronic cortical infarct in the right frontal operculum. Vascular: Major intracranial vascular flow voids are preserved. Mild generalized intracranial arterial dolichoectasia. Skull and upper cervical spine: Unremarkable bone marrow signal. Sinuses/Orbits: Unremarkable orbits. Clear paranasal sinuses. Trace left mastoid fluid or edema. Other: None. IMPRESSION: 1. 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 2. 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 3. 2.8 cm right parietal parafalcine extra-axial mass consistent with a meningioma. Mild vasogenic edema and local mass effect. 4. Mild chronic small vessel ischemic disease. Electronically Signed   By: Logan Bores M.D.   On: 01/01/2019 16:29    Assessment: 58 y.o. male presenting  with complaints of left sided weakness.  MRI of the brain reviewed and shows acute right cerebellar and right pontine infarcts.  Also noted is a right parietal parafalcine meningioma with some mild surrounding vasogenic edema and local mass effect.  Symptoms likely from acute infarcts secondary to small vessel disease and not meningioma.  Patient hypertensive.  On no antiplatelet/anticoagulation therapy prior to admission.  LDL 133.  Stroke Risk Factors -  hypertension and smoking  Plan: 1. HgbA1c pending 2. CTA of the head and neck  3. PT consult, OT consult, Speech consult 4. Echocardiogram pending 5. Carotid dopplers pending 6. Prophylactic therapy-Dual antiplatelet therapy with ASA 81mg  and Plavix 75mg  for three weeks with change to ASA 81mg  alone as monotherapy after that time. 7. NPO until RN stroke swallow screen 8. Telemetry monitoring 9. Frequent neuro checks 10. Smoking cessation counseling 11. Statin for lipid management with target LDL<70. 12. BP management with initial SBP target of 150-160.  Would then attempt control of SBP<140.   10. Neurosurgery managing steroids   Alexis Goodell, MD Neurology [phone] 01/02/2019, 12:16 PM

## 2019-01-02 NOTE — Progress Notes (Signed)
patients blood pressire 189/124 HR 81 just gave his scheduled metoprolol and he just had hydralazine 10mg  po from night nurse. Messaged Dr. Brett Albino and she said to   give him a little time and see what his BP does, we want some permissive hypertension because of his stroke. She put in some prn labetalol if his BP remains elevated over the next 1-2 hours.

## 2019-01-03 ENCOUNTER — Inpatient Hospital Stay: Payer: Medicaid Other

## 2019-01-03 LAB — HEMOGLOBIN A1C
Hgb A1c MFr Bld: 5.5 % (ref 4.8–5.6)
Mean Plasma Glucose: 111 mg/dL

## 2019-01-03 MED ORDER — LISINOPRIL 20 MG PO TABS
20.0000 mg | ORAL_TABLET | Freq: Every day | ORAL | Status: DC
  Administered 2019-01-04: 20 mg via ORAL
  Filled 2019-01-03: qty 1

## 2019-01-03 MED ORDER — LISINOPRIL 10 MG PO TABS
10.0000 mg | ORAL_TABLET | Freq: Once | ORAL | Status: AC
  Administered 2019-01-03: 10:00:00 10 mg via ORAL
  Filled 2019-01-03: qty 1

## 2019-01-03 MED ORDER — HYDROCHLOROTHIAZIDE 25 MG PO TABS
25.0000 mg | ORAL_TABLET | Freq: Every day | ORAL | Status: DC
  Administered 2019-01-03 – 2019-01-04 (×2): 25 mg via ORAL
  Filled 2019-01-03: qty 1

## 2019-01-03 MED ORDER — IOHEXOL 300 MG/ML  SOLN
100.0000 mL | Freq: Once | INTRAMUSCULAR | Status: AC | PRN
  Administered 2019-01-03: 16:00:00 100 mL via INTRAVENOUS

## 2019-01-03 MED ORDER — IOHEXOL 9 MG/ML PO SOLN
500.0000 mL | ORAL | Status: AC
  Administered 2019-01-03 (×2): 500 mL via ORAL

## 2019-01-03 NOTE — Progress Notes (Signed)
PT Cancellation Note  Patient Details Name: Paul Leonard MRN: [id number] DOB: [birth year]   Cancelled Treatment:    Reason Eval/Treat Not Completed: Other (comment) Pt's BP is still elevated at 199/116. Pt is not appropriate for PT at this time. PT will re-attempt tomorrow.   Dixie Dials, SPT   Tri [place] 01/03/2019, 1:23 PM

## 2019-01-03 NOTE — Progress Notes (Signed)
Subjective: No new neurological complaints.    Objective: Current vital signs: BP (!) 211/125 (BP Location: Right Arm)   Pulse 64   Temp 97.6 F (36.4 C)   Resp 18   Ht 5\' 10"  (1.778 m)   Wt 83.9 kg   SpO2 100%   BMI 26.54 kg/m  Vital signs in last 24 hours: Temp:  [97.6 F (36.4 C)-98.8 F (37.1 C)] 97.6 F (36.4 C) (10/21 0806) Pulse Rate:  [64-83] 64 (10/21 0806) Resp:  [17-20] 18 (10/21 0806) BP: (156-211)/(94-137) 211/125 (10/21 0806) SpO2:  [93 %-100 %] 100 % (10/21 0806)  Intake/Output from previous day: 10/20 0701 - 10/21 0700 In: 960 [P.O.:960] Out: -  Intake/Output this shift: No intake/output data recorded. Nutritional status:  Diet Order            Diet Heart Room service appropriate? Yes; Fluid consistency: Thin  Diet effective now              Neurologic Exam: Mental Status: Alert, oriented, thought content appropriate.  Speech fluent without evidence of aphasia.  Mild dysarthria.   Able to follow 3 step commands without difficulty. Cranial Nerves: II: Discs flat bilaterally; Visual fields grossly normal, pupils equal, round, reactive to light and accommodation III,IV, VI: ptosis not present, extra-ocular motions intact bilaterally V,VII: mild left facial droop, facial light touch sensation normal bilaterally VIII: hearing normal bilaterally IX,X: gag reflex present XI: bilateral shoulder shrug XII: midline tongue extension Motor: Right :  Upper extremity   5/5                                      Left:     Upper extremity   4/5             Lower extremity   5/5                                                  Lower extremity   4/5 Tone and bulk:normal tone throughout; no atrophy noted Sensory: Pinprick and light touch intact throughout, bilaterally   Lab Results: Basic Metabolic Panel: Recent Labs  Lab 01/01/19 1129  NA 142  K 3.5  CL 107  CO2 28  GLUCOSE 104*  BUN 15  CREATININE 0.87  CALCIUM 8.9    Liver Function  Tests: Recent Labs  Lab 01/01/19 1129  AST 15  ALT 15  ALKPHOS 55  BILITOT 0.7  PROT 6.7  ALBUMIN 3.8   No results for input(s): LIPASE, AMYLASE in the last 168 hours. No results for input(s): AMMONIA in the last 168 hours.  CBC: Recent Labs  Lab 01/01/19 1129  WBC 4.5  NEUTROABS 2.5  HGB 15.1  HCT 48.0  MCV 88.7  PLT 287    Cardiac Enzymes: No results for input(s): CKTOTAL, CKMB, CKMBINDEX, TROPONINI in the last 168 hours.  Lipid Panel: Recent Labs  Lab 01/02/19 0427  CHOL 191  TRIG 35  HDL 51  CHOLHDL 3.7  VLDL 7  LDLCALC 133*    CBG: No results for input(s): GLUCAP in the last 168 hours.  Microbiology: Results for orders placed or performed during the hospital encounter of 01/01/19  SARS CORONAVIRUS 2 (TAT 6-24 HRS) Nasopharyngeal Nasopharyngeal Swab     Status:  None   Collection Time: 01/01/19  7:22 PM   Specimen: Nasopharyngeal Swab  Result Value Ref Range Status   SARS Coronavirus 2 NEGATIVE NEGATIVE Final    Comment: (NOTE) SARS-CoV-2 target nucleic acids are NOT DETECTED. The SARS-CoV-2 RNA is generally detectable in upper and lower respiratory specimens during the acute phase of infection. Negative results do not preclude SARS-CoV-2 infection, do not rule out co-infections with other pathogens, and should not be used as the sole basis for treatment or other patient management decisions. Negative results must be combined with clinical observations, patient history, and epidemiological information. The expected result is Negative. Fact Sheet for Patients: SugarRoll.be Fact Sheet for Healthcare Providers: https://www.woods-mathews.com/ This test is not yet approved or cleared by the [place] FDA and  has been authorized for detection and/or diagnosis of SARS-CoV-2 by FDA under an Emergency Use Authorization (EUA). This EUA will remain  in effect (meaning this test can be used) for the duration of  the COVID-19 declaration under Section 56 4(b)(1) of the Act, 21 U.S.C. section 360bbb-3(b)(1), unless the authorization is terminated or revoked sooner. Performed at [facility], [place] [address], [place],  [address]     Coagulation Studies: Recent Labs    01/01/19 1129  LABPROT 13.2  INR 1.0    Imaging: Ct Angio Head W Or Wo Contrast  Result Date: 01/02/2019 CLINICAL DATA:  Follow-up stroke. Left-sided weakness. Acute infarctions of the right pons and cerebellum. Right parietal para falcine meningioma. EXAM: CT ANGIOGRAPHY HEAD AND NECK TECHNIQUE: Multidetector CT imaging of the head and neck was performed using the standard protocol during bolus administration of intravenous contrast. Multiplanar CT image reconstructions and MIPs were obtained to evaluate the vascular anatomy. Carotid stenosis measurements (when applicable) are obtained utilizing NASCET criteria, using the distal internal carotid diameter as the denominator. CONTRAST:  41mL OMNIPAQUE IOHEXOL 350 MG/ML SOLN COMPARISON:  None. MRI 01/01/2019. FINDINGS: CT HEAD FINDINGS Brain: Low-density now visible in the right pons associated with the acute infarction. Subtle low-density evident in the right cerebellum associated with the right cerebellar infarctions. The remainder of brain does not show any ischemic change. Right frontoparietal vertex parasagittal meningioma measuring 3.5 x 2.8 x 2.8 Cm with mass-effect upon the brain and mild adjacent edema as seen previously. Vascular: See below Skull: Negative Sinuses: Clear Orbits: Normal CTA NECK FINDINGS Aortic arch: Aortic atherosclerosis. No aneurysm or dissection. Branching pattern is normal. Right carotid system: Right common carotid artery widely patent to the bifurcation. Atherosclerotic plaque at the ICA bulb. Minimal diameter 4 mm. Compared to a more distal cervical ICA diameter of 4 mm, there is no stenosis. Left carotid system: Common carotid artery widely patent  to the bifurcation. Mild atherosclerotic plaque at the ICA bulb. Minimal diameter 4 mm. Compared to a more distal cervical ICA diameter of 4 mm, there is no stenosis. Vertebral arteries: Vertebral artery origins are patent. Right vertebral artery is a tiny vessel. Left vertebral artery is dominant and patent through the cervical region to the foramen magnum. Skeleton: Ordinary cervical spondylosis. Other neck: No mass or lymphadenopathy. Upper chest: Negative. Right azygos fissure. Review of the MIP images confirms the above findings CTA HEAD FINDINGS Anterior circulation: Both internal carotid arteries are patent through the skull base and siphon regions. There is atherosclerotic calcification in the carotid siphon regions. There is dolichoectasia the right distal ICA and proximal middle cerebral and anterior cerebral arteries with fusiform dilatation of the supraclinoid ICA on the right with diameter  up to 5 mm. The anterior and middle cerebral vessels are patent. Right M1 segment also shows atherosclerotic disease with mild fusiform dilatation. No large or medium vessel occlusion identified. Posterior circulation: Both vertebral arteries are patent through the foramen magnum to the basilar. The left gives the predominant supply. 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 Mid to distal basilar artery does show flow but is reduced in size diffusely. Superior cerebellar and posterior cerebral arteries show flow. Left PCA receives most of it supply from the anterior circulation. Venous sinuses: Patent and normal. Anatomic variants: None other significant. Review of the MIP images confirms the above findings IMPRESSION: 1. Atherosclerotic plaque in both internal carotid artery bulb regions. No stenosis on either side. 2. 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 Mid to distal basilar artery does show flow but is reduced in size diffusely. 3. Atherosclerotic  disease in both carotid siphon regions. 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 Some dolichoectasia also of the proximal right anterior and middle cerebral vessels but without apparent stenosis. 4. Both vertebral arteries are patent to the basilar. The left vertebral artery gives the predominant supply. 5. Right frontoparietal vertex meningioma with mass-effect upon the brain and mild adjacent edema as seen previously. 6. Aortic atherosclerosis Electronically Signed   By: Nelson Chimes M.D.   On: 01/02/2019 15:19   Dg Abd 1 View  Result Date: 01/03/2019 CLINICAL DATA:  Abdominal pain EXAM: ABDOMEN - 1 VIEW COMPARISON:  None. FINDINGS: A few mildly dilated small bowel loops in the central and left abdomen up to 3.7 cm diameter. Mild colonic stool. No evidence of pneumatosis or pneumoperitoneum. No radiopaque nephrolithiasis. Clear lung bases. IMPRESSION: Mildly dilated central and left abdominal small bowel loops, nonspecific, cannot exclude partial mid to distal small bowel obstruction. CT abdomen/pelvis with oral and IV contrast may be obtained for further evaluation as clinically warranted. Electronically Signed   By: Ilona Sorrel M.D.   On: 01/03/2019 09:39   Ct Head Wo Contrast  Result Date: 01/01/2019 CLINICAL DATA:  Possible stroke. Focal neuro deficit which is not specified EXAM: CT HEAD WITHOUT CONTRAST TECHNIQUE: Contiguous axial images were obtained from the base of the skull through the vertex without intravenous contrast. COMPARISON:  None. FINDINGS: Brain: 4 cm mass the right frontal parietal junction which is relatively high density and could be meningioma but it is unclear if the mass is intra or extra-axial. There is local cerebral mass effect with mild white matter low-density. No acute hemorrhage, hydrocephalus, or collection. Dural calcifications along the tentorium. Vascular: Scattered atherosclerotic calcifications. Skull: No  acute or aggressive finding. No erosion or hyperostosis seen at the level of the mass. Sinuses/Orbits: Negative IMPRESSION: 4 cm mass at the right parasagittal vertex which could be intra or extra-axial. There is local mass effect with mild brain edema, recommend MRI with contrast if possible. Electronically Signed   By: Monte Fantasia M.D.   On: 01/01/2019 11:47   Ct Angio Neck W Or Wo Contrast  Result Date: 01/02/2019 CLINICAL DATA:  Follow-up stroke. Left-sided weakness. Acute infarctions of the right pons and cerebellum. Right parietal para falcine meningioma. EXAM: CT ANGIOGRAPHY HEAD AND NECK TECHNIQUE: Multidetector CT imaging of the head and neck was performed using the standard protocol during bolus administration of intravenous contrast. Multiplanar CT image reconstructions and MIPs were obtained to evaluate the vascular anatomy. Carotid stenosis measurements (when applicable) are  obtained utilizing NASCET criteria, using the distal internal carotid diameter as the denominator. CONTRAST:  96mL OMNIPAQUE IOHEXOL 350 MG/ML SOLN COMPARISON:  None. MRI 01/01/2019. FINDINGS: CT HEAD FINDINGS Brain: Low-density now visible in the right pons associated with the acute infarction. Subtle low-density evident in the right cerebellum associated with the right cerebellar infarctions. The remainder of brain does not show any ischemic change. Right frontoparietal vertex parasagittal meningioma measuring 3.5 x 2.8 x 2.8 Cm with mass-effect upon the brain and mild adjacent edema as seen previously. Vascular: See below Skull: Negative Sinuses: Clear Orbits: Normal CTA NECK FINDINGS Aortic arch: Aortic atherosclerosis. No aneurysm or dissection. Branching pattern is normal. Right carotid system: Right common carotid artery widely patent to the bifurcation. Atherosclerotic plaque at the ICA bulb. Minimal diameter 4 mm. Compared to a more distal cervical ICA diameter of 4 mm, there is no stenosis. Left carotid system:  Common carotid artery widely patent to the bifurcation. Mild atherosclerotic plaque at the ICA bulb. Minimal diameter 4 mm. Compared to a more distal cervical ICA diameter of 4 mm, there is no stenosis. Vertebral arteries: Vertebral artery origins are patent. Right vertebral artery is a tiny vessel. Left vertebral artery is dominant and patent through the cervical region to the foramen magnum. Skeleton: Ordinary cervical spondylosis. Other neck: No mass or lymphadenopathy. Upper chest: Negative. Right azygos fissure. Review of the MIP images confirms the above findings CTA HEAD FINDINGS Anterior circulation: Both internal carotid arteries are patent through the skull base and siphon regions. There is atherosclerotic calcification in the carotid siphon regions. There is dolichoectasia the right distal ICA and proximal middle cerebral and anterior cerebral arteries with fusiform dilatation of the supraclinoid ICA on the right with diameter up to 5 mm. The anterior and middle cerebral vessels are patent. Right M1 segment also shows atherosclerotic disease with mild fusiform dilatation. No large or medium vessel occlusion identified. Posterior circulation: Both vertebral arteries are patent through the foramen magnum to the basilar. The left gives the predominant supply. 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 Mid to distal basilar artery does show flow but is reduced in size diffusely. Superior cerebellar and posterior cerebral arteries show flow. Left PCA receives most of it supply from the anterior circulation. Venous sinuses: Patent and normal. Anatomic variants: None other significant. Review of the MIP images confirms the above findings IMPRESSION: 1. Atherosclerotic plaque in both internal carotid artery bulb regions. No stenosis on either side. 2. 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 Mid to distal basilar artery does show flow but is reduced in  size diffusely. 3. Atherosclerotic disease in both carotid siphon regions. 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 Some dolichoectasia also of the proximal right anterior and middle cerebral vessels but without apparent stenosis. 4. Both vertebral arteries are patent to the basilar. The left vertebral artery gives the predominant supply. 5. Right frontoparietal vertex meningioma with mass-effect upon the brain and mild adjacent edema as seen previously. 6. Aortic atherosclerosis Electronically Signed   By: Nelson Chimes M.D.   On: 01/02/2019 15:19   Mr [name] And Wo Contrast  Result Date: 01/01/2019 CLINICAL DATA:  Left-sided weakness and headache. Brain mass on CT. EXAM: MRI HEAD WITHOUT AND WITH CONTRAST TECHNIQUE: Multiplanar, multiecho pulse sequences of the brain and surrounding structures were obtained without and with intravenous contrast. CONTRAST:  68mL GADAVIST GADOBUTROL 1 MMOL/ML IV SOLN  COMPARISON:  Head CT 01/01/2019 FINDINGS: Brain: Small acute infarcts are present in the right paramedian pons and right cerebellum including middle cerebellar peduncle. There are also additional small right cerebellar infarcts which are more subacute to chronic in appearance. There is minimal enhancement associated with some of the right cerebellar infarcts. A homogeneously enhancing parafalcine mass in the right parietal region measures 2.8 x 2.8 x 3.1 cm (AP x transverse x craniocaudal) and appears to be extra-axial. There is local mass effect on the right parietal lobe with mild vasogenic edema in the white matter. No second enhancing intracranial lesion is identified. The ventricles are normal in size. Small foci of T2 hyperintensity in the cerebral white matter bilaterally are nonspecific but compatible with mild chronic small vessel ischemic disease. There is a small chronic cortical infarct in the right frontal operculum. Vascular: Major intracranial  vascular flow voids are preserved. Mild generalized intracranial arterial dolichoectasia. Skull and upper cervical spine: Unremarkable bone marrow signal. Sinuses/Orbits: Unremarkable orbits. Clear paranasal sinuses. Trace left mastoid fluid or edema. Other: None. IMPRESSION: 1. 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 2. 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 3. 2.8 cm right parietal parafalcine extra-axial mass consistent with a meningioma. Mild vasogenic edema and local mass effect. 4. Mild chronic small vessel ischemic disease. Electronically Signed   By: Logan Bores M.D.   On: 01/01/2019 16:29   US Carotid Bilateral (at [facility] And [facility] Only)  Result Date: 01/02/2019 CLINICAL DATA:  58 year old male with stroke-like symptoms EXAM: BILATERAL CAROTID DUPLEX ULTRASOUND TECHNIQUE: Pearline Cables scale imaging, color Doppler and duplex ultrasound were performed of bilateral carotid and vertebral arteries in the neck. COMPARISON:  None. FINDINGS: Criteria: Quantification of carotid stenosis is based on velocity parameters that correlate the residual internal carotid diameter with NASCET-based stenosis levels, using the diameter of the distal internal carotid lumen as the denominator for stenosis measurement. The following velocity measurements were obtained: RIGHT ICA: 94/26 cm/sec CCA: [id number] cm/sec SYSTOLIC ICA/CCA RATIO:  0.9 ECA:  113 cm/sec LEFT ICA: 80/29 cm/sec CCA: [id number] cm/sec SYSTOLIC ICA/CCA RATIO:  0.8 ECA:  88 cm/sec RIGHT CAROTID ARTERY: Trace smooth heterogeneous atherosclerotic plaque in the distal common carotid artery and proximal internal carotid artery. By peak systolic velocity criteria, the estimated stenosis remains less than 50%. RIGHT VERTEBRAL ARTERY:  Patent with normal antegrade flow. LEFT CAROTID ARTERY: Mild smooth heterogeneous atherosclerotic plaque in the proximal internal carotid artery. By peak systolic velocity criteria, the estimated stenosis  remains less than 50%. LEFT VERTEBRAL ARTERY:  Patent with normal antegrade flow. IMPRESSION: 1. Mild (1-49%) stenosis proximal right internal carotid artery secondary to heterogenous atherosclerotic plaque. 2. Mild (1-49%) stenosis proximal left internal carotid artery secondary to heterogenous atherosclerotic plaque. 3. Vertebral arteries remain patent with normal antegrade flow. Signed, Criselda Peaches, MD, [facility] Electronically Signed   By: Jacqulynn Cadet M.D.   On: 01/02/2019 12:32    Medications:  I have reviewed the patient's current medications. Scheduled: . aspirin  81 mg Oral Daily  . atorvastatin  40 mg Oral q1800  . clopidogrel  75 mg Oral Daily  . dexamethasone  4 mg Oral Q8H  . enoxaparin (LOVENOX) injection  40 mg Subcutaneous Q24H  . lisinopril  10 mg Oral Daily  . metoprolol tartrate  25 mg Oral BID  . nicotine  14 mg Transdermal Daily    Assessment/Plan: 58 y.o. male presenting with complaints of left sided  weakness.  MRI of the brain reviewed and shows acute right cerebellar and right pontine infarcts.  Also noted is a right parietal parafalcine meningioma with some mild surrounding vasogenic edema and local mass effect.  Symptoms likely from acute infarcts secondary to small vessel disease and not meningioma.  Patient hypertensive.  On no antiplatelet/anticoagulation therapy prior to admission.  CTA of the head and neck shows severe proximal basilar artery stenosis.  Embolism from this site likely cause of infarcts.  Echocardiogram shows no cardiac source of emboli with EF of 60-65%. A1c 5.5.  LDL 133.  Recommendations: 1. Continue dual antiplatelet therapy 2. Smoking cessation 3. Statin for lipid management with target LDL<70. 4. BP control 5. After discharge patient to be evaluated by interventional radiology for need of stenting.     LOS: 2 days   Alexis Goodell,  MD Neurology [phone] 01/03/2019  9:47 AM

## 2019-01-03 NOTE — Progress Notes (Signed)
OT Cancellation Note  Patient Details Name: Paul Leonard MRN: [id number] DOB: Jan 29, 1961   Cancelled Treatment:    Reason Eval/Treat Not Completed: Medical issues which prohibited therapy Pt still with elevated BP at this time (most recent reading at 1130 was 197/119 versus AM pressure 211/125). Will withhold OT at this time and f/u as pt becomes more appropriate.   Jake Church [name] 01/03/2019, 1:22 PM

## 2019-01-03 NOTE — Progress Notes (Addendum)
[facility] [place] NAME: Paul Leonard    MR#:  [id number]  DATE OF BIRTH:  [birth year]  SUBJECTIVE:   Patient is endorsing periumbilical abdominal pain today.  He denies any nausea or vomiting.  He had a bowel movement yesterday and another one today.  He is unable to describe the pain.  He states he has never had this before.  He feels like his left arm and leg weakness is getting better every day.  No headaches.  REVIEW OF SYSTEMS:  Review of Systems  Constitutional: Negative for chills and fever.  HENT: Negative for congestion and sore throat.   Eyes: Negative for blurred vision and double vision.  Respiratory: Negative for cough and shortness of breath.   Cardiovascular: Negative for chest pain and palpitations.  Gastrointestinal: Negative for nausea and vomiting.  Genitourinary: Negative for dysuria and urgency.  Musculoskeletal: Negative for back pain and neck pain.  Neurological: Positive for sensory change, speech change, focal weakness and headaches. Negative for tingling and seizures.  Psychiatric/Behavioral: Negative for depression. The patient is not nervous/anxious.    DRUG ALLERGIES:  No Known Allergies VITALS:  Blood pressure (!) 211/125, pulse 64, temperature 97.6 F (36.4 C), resp. rate 18, height 5\' 10"  (1.778 m), weight 83.9 kg, SpO2 100 %. PHYSICAL EXAMINATION:  Physical Exam  GENERAL:  Laying in the bed with no acute distress.  HEENT: Head atraumatic, normocephalic. Pupils equal, round, reactive to light and accommodation. No scleral icterus. Extraocular muscles intact. Oropharynx and nasopharynx clear.  NECK:  Supple, no jugular venous distention. No thyroid enlargement. LUNGS: Lungs are clear to auscultation bilaterally. No wheezes, crackles, rhonchi. No use of accessory muscles of respiration.  CARDIOVASCULAR: RRR, S1, S2 normal. No murmurs, rubs, or gallops.  ABDOMEN: Soft, nondistended. Bowel sounds present.  +tenderness to palpation of the periumbilical region. EXTREMITIES: No pedal edema, cyanosis, or clubbing.  NEUROLOGIC: CN 2-12 intact, no focal deficits. 5/5 muscle strength in the right upper extremity and right lower extremity.  4/5 muscle strength in the left upper extremity and left lower extremity. + Diminished sensation to light touch in the left arm and leg. Gait not checked.  PSYCHIATRIC: The patient is alert and oriented x 3.  SKIN: No obvious rash, lesion, or ulcer.  LABORATORY PANEL:  Male CBC Recent Labs  Lab 01/01/19 1129  WBC 4.5  HGB 15.1  HCT 48.0  PLT 287   ------------------------------------------------------------------------------------------------------------------ Chemistries  Recent Labs  Lab 01/01/19 1129  NA 142  K 3.5  CL 107  CO2 28  GLUCOSE 104*  BUN 15  CREATININE 0.87  CALCIUM 8.9  AST 15  ALT 15  ALKPHOS 55  BILITOT 0.7   RADIOLOGY:  Ct Angio Head W Or Wo Contrast  Result Date: 01/02/2019 CLINICAL DATA:  Follow-up stroke. Left-sided weakness. Acute infarctions of the right pons and cerebellum. Right parietal para falcine meningioma. EXAM: CT ANGIOGRAPHY HEAD AND NECK TECHNIQUE: Multidetector CT imaging of the head and neck was performed using the standard protocol during bolus administration of intravenous contrast. Multiplanar CT image reconstructions and MIPs were obtained to evaluate the vascular anatomy. Carotid stenosis measurements (when applicable) are obtained utilizing NASCET criteria, using the distal internal carotid diameter as the denominator. CONTRAST:  39mL OMNIPAQUE IOHEXOL 350 MG/ML SOLN COMPARISON:  None. MRI 01/01/2019. FINDINGS: CT HEAD FINDINGS Brain: Low-density now visible in the right pons associated with the acute infarction. Subtle low-density evident in the right cerebellum associated with the  right cerebellar infarctions. The remainder of brain does not show any ischemic change. Right frontoparietal vertex parasagittal  meningioma measuring 3.5 x 2.8 x 2.8 Cm with mass-effect upon the brain and mild adjacent edema as seen previously. Vascular: See below Skull: Negative Sinuses: Clear Orbits: Normal CTA NECK FINDINGS Aortic arch: Aortic atherosclerosis. No aneurysm or dissection. Branching pattern is normal. Right carotid system: Right common carotid artery widely patent to the bifurcation. Atherosclerotic plaque at the ICA bulb. Minimal diameter 4 mm. Compared to a more distal cervical ICA diameter of 4 mm, there is no stenosis. Left carotid system: Common carotid artery widely patent to the bifurcation. Mild atherosclerotic plaque at the ICA bulb. Minimal diameter 4 mm. Compared to a more distal cervical ICA diameter of 4 mm, there is no stenosis. Vertebral arteries: Vertebral artery origins are patent. Right vertebral artery is a tiny vessel. Left vertebral artery is dominant and patent through the cervical region to the foramen magnum. Skeleton: Ordinary cervical spondylosis. Other neck: No mass or lymphadenopathy. Upper chest: Negative. Right azygos fissure. Review of the MIP images confirms the above findings CTA HEAD FINDINGS Anterior circulation: Both internal carotid arteries are patent through the skull base and siphon regions. There is atherosclerotic calcification in the carotid siphon regions. There is dolichoectasia the right distal ICA and proximal middle cerebral and anterior cerebral arteries with fusiform dilatation of the supraclinoid ICA on the right with diameter up to 5 mm. The anterior and middle cerebral vessels are patent. Right M1 segment also shows atherosclerotic disease with mild fusiform dilatation. No large or medium vessel occlusion identified. Posterior circulation: Both vertebral arteries are patent through the foramen magnum to the basilar. The left gives the predominant supply. 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 Mid to distal basilar artery does show flow  but is reduced in size diffusely. Superior cerebellar and posterior cerebral arteries show flow. Left PCA receives most of it supply from the anterior circulation. Venous sinuses: Patent and normal. Anatomic variants: None other significant. Review of the MIP images confirms the above findings IMPRESSION: 1. Atherosclerotic plaque in both internal carotid artery bulb regions. No stenosis on either side. 2. 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 Mid to distal basilar artery does show flow but is reduced in size diffusely. 3. Atherosclerotic disease in both carotid siphon regions. 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 Some dolichoectasia also of the proximal right anterior and middle cerebral vessels but without apparent stenosis. 4. Both vertebral arteries are patent to the basilar. The left vertebral artery gives the predominant supply. 5. Right frontoparietal vertex meningioma with mass-effect upon the brain and mild adjacent edema as seen previously. 6. Aortic atherosclerosis Electronically Signed   By: Nelson Chimes M.D.   On: 01/02/2019 15:19   Dg Abd 1 View  Result Date: 01/03/2019 CLINICAL DATA:  Abdominal pain EXAM: ABDOMEN - 1 VIEW COMPARISON:  None. FINDINGS: A few mildly dilated small bowel loops in the central and left abdomen up to 3.7 cm diameter. Mild colonic stool. No evidence of pneumatosis or pneumoperitoneum. No radiopaque nephrolithiasis. Clear lung bases. IMPRESSION: Mildly dilated central and left abdominal small bowel loops, nonspecific, cannot exclude partial mid to distal small bowel obstruction. CT abdomen/pelvis with oral and IV contrast may be obtained for further evaluation as clinically warranted. Electronically Signed   By: Ilona Sorrel M.D.   On: 01/03/2019 09:39   Ct  Angio Neck W Or Wo Contrast  Result Date: 01/02/2019 CLINICAL DATA:  Follow-up stroke. Left-sided weakness. Acute  infarctions of the right pons and cerebellum. Right parietal para falcine meningioma. EXAM: CT ANGIOGRAPHY HEAD AND NECK TECHNIQUE: Multidetector CT imaging of the head and neck was performed using the standard protocol during bolus administration of intravenous contrast. Multiplanar CT image reconstructions and MIPs were obtained to evaluate the vascular anatomy. Carotid stenosis measurements (when applicable) are obtained utilizing NASCET criteria, using the distal internal carotid diameter as the denominator. CONTRAST:  68mL OMNIPAQUE IOHEXOL 350 MG/ML SOLN COMPARISON:  None. MRI 01/01/2019. FINDINGS: CT HEAD FINDINGS Brain: Low-density now visible in the right pons associated with the acute infarction. Subtle low-density evident in the right cerebellum associated with the right cerebellar infarctions. The remainder of brain does not show any ischemic change. Right frontoparietal vertex parasagittal meningioma measuring 3.5 x 2.8 x 2.8 Cm with mass-effect upon the brain and mild adjacent edema as seen previously. Vascular: See below Skull: Negative Sinuses: Clear Orbits: Normal CTA NECK FINDINGS Aortic arch: Aortic atherosclerosis. No aneurysm or dissection. Branching pattern is normal. Right carotid system: Right common carotid artery widely patent to the bifurcation. Atherosclerotic plaque at the ICA bulb. Minimal diameter 4 mm. Compared to a more distal cervical ICA diameter of 4 mm, there is no stenosis. Left carotid system: Common carotid artery widely patent to the bifurcation. Mild atherosclerotic plaque at the ICA bulb. Minimal diameter 4 mm. Compared to a more distal cervical ICA diameter of 4 mm, there is no stenosis. Vertebral arteries: Vertebral artery origins are patent. Right vertebral artery is a tiny vessel. Left vertebral artery is dominant and patent through the cervical region to the foramen magnum. Skeleton: Ordinary cervical spondylosis. Other neck: No mass or lymphadenopathy. Upper chest:  Negative. Right azygos fissure. Review of the MIP images confirms the above findings CTA HEAD FINDINGS Anterior circulation: Both internal carotid arteries are patent through the skull base and siphon regions. There is atherosclerotic calcification in the carotid siphon regions. There is dolichoectasia the right distal ICA and proximal middle cerebral and anterior cerebral arteries with fusiform dilatation of the supraclinoid ICA on the right with diameter up to 5 mm. The anterior and middle cerebral vessels are patent. Right M1 segment also shows atherosclerotic disease with mild fusiform dilatation. No large or medium vessel occlusion identified. Posterior circulation: Both vertebral arteries are patent through the foramen magnum to the basilar. The left gives the predominant supply. 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 Mid to distal basilar artery does show flow but is reduced in size diffusely. Superior cerebellar and posterior cerebral arteries show flow. Left PCA receives most of it supply from the anterior circulation. Venous sinuses: Patent and normal. Anatomic variants: None other significant. Review of the MIP images confirms the above findings IMPRESSION: 1. Atherosclerotic plaque in both internal carotid artery bulb regions. No stenosis on either side. 2. 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 Mid to distal basilar artery does show flow but is reduced in size diffusely. 3. Atherosclerotic disease in both carotid siphon regions. 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 Some dolichoectasia also of the proximal right anterior and middle cerebral vessels but without apparent stenosis. 4. Both vertebral arteries are patent to the basilar. The left vertebral artery gives the predominant supply. 5. Right frontoparietal vertex meningioma with mass-effect upon the brain and mild adjacent  edema  as seen previously. 6. Aortic atherosclerosis Electronically Signed   By: Nelson Chimes M.D.   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 Lacinda Axon as an outpatient in 4-6 weeks  Uncontrolled hypertension- BP remains markedly elevated. -Holding home hydralazine, as it can worsen cerebral edema -Increase lisinopril to 20 mg daily -Add hydrochlorothiazide 25 mg daily -Continue home metoprolol -IV labetalol prn  Periumbilical abdominal pain -KUB this morning with mildly dilated small bowel loops, cannot exclude obstruction -Will order CT abdomen/pelvis for further evaluation  Tobacco use -Continue nicotine patch  Sister, Freda Munro, updated over the phone. Possible discharge home tomorrow.  All the records are reviewed and case discussed with Care Management/Social Worker. Management plans discussed with the patient, family and they are in agreement.  CODE STATUS: Full Code  TOTAL TIME TAKING CARE OF THIS PATIENT: 40 minutes.   More than 50% of the time was spent in counseling/coordination of care: YES  POSSIBLE D/C IN 1-2 DAYS, DEPENDING ON CLINICAL CONDITION.   Berna Spare Daisy Lites M.D on 01/03/2019 at 12:42 PM  Between 7am to 6pm - Pager - [phone]  After 6pm go to [email or web address] - Proofreader  [facility]  Office   [phone]  CC: Primary care physician; Patient, No Pcp Per  Note: This dictation was prepared with Dragon dictation along with smaller phrase technology. Any transcriptional errors that result from this process are unintentional.

## 2019-01-03 NOTE — Progress Notes (Signed)
PT Cancellation Note  Patient Details Name: Paul Leonard MRN: [id number] DOB: 1960-12-17   Cancelled Treatment:    Reason Eval/Treat Not Completed: Other (comment). Consult received and chart reviewed. Pt currently with elevated BP at 211/125. Not appropriate for PT evaluation at this time. Will re-attempt in PM.   Jamayia Croker 01/03/2019, 9:35 AM Greggory Stallion, PT, DPT [phone]

## 2019-01-04 LAB — BASIC METABOLIC PANEL
Anion gap: 11 (ref 5–15)
BUN: 18 mg/dL (ref 6–20)
CO2: 27 mmol/L (ref 22–32)
Calcium: 8.8 mg/dL — ABNORMAL LOW (ref 8.9–10.3)
Chloride: 102 mmol/L (ref 98–111)
Creatinine, Ser: 0.97 mg/dL (ref 0.61–1.24)
GFR calc Af Amer: >60 mL/min (ref >60)
GFR calc non Af Amer: >60 mL/min (ref >60)
Glucose, Bld: 153 mg/dL — ABNORMAL HIGH (ref 70–99)
Potassium: 3.9 mmol/L (ref 3.5–5.1)
Sodium: 140 mmol/L (ref 135–145)

## 2019-01-04 MED ORDER — HYDROCHLOROTHIAZIDE 25 MG PO TABS: 25.0000 mg | ORAL_TABLET | Freq: Every day | ORAL | 0 refills | Status: AC

## 2019-01-04 MED ORDER — SENNOSIDES-DOCUSATE SODIUM 8.6-50 MG PO TABS: 1.0000 | ORAL_TABLET | Freq: Two times a day (BID) | ORAL | 0 refills | Status: DC

## 2019-01-04 MED ORDER — HYDROCHLOROTHIAZIDE 25 MG PO TABS: 25.0000 mg | ORAL_TABLET | Freq: Every day | ORAL | 0 refills | Status: DC

## 2019-01-04 MED ORDER — AMLODIPINE BESYLATE 5 MG PO TABS: 5.0000 mg | ORAL_TABLET | Freq: Every day | ORAL | 0 refills | Status: AC

## 2019-01-04 MED ORDER — AMLODIPINE BESYLATE 5 MG PO TABS
5.0000 mg | ORAL_TABLET | Freq: Every day | ORAL | Status: DC
  Administered 2019-01-04: 10:00:00 5 mg via ORAL
  Filled 2019-01-04: qty 1

## 2019-01-04 MED ORDER — LISINOPRIL 20 MG PO TABS: 20.0000 mg | ORAL_TABLET | Freq: Every day | ORAL | 0 refills | Status: DC

## 2019-01-04 MED ORDER — CLOPIDOGREL BISULFATE 75 MG PO TABS: 75.0000 mg | ORAL_TABLET | Freq: Every day | ORAL | 0 refills | Status: DC

## 2019-01-04 MED ORDER — LISINOPRIL 20 MG PO TABS: 20.0000 mg | ORAL_TABLET | Freq: Every day | ORAL | 0 refills | Status: AC

## 2019-01-04 MED ORDER — METOPROLOL TARTRATE 25 MG PO TABS: 25.0000 mg | ORAL_TABLET | Freq: Two times a day (BID) | ORAL | 0 refills | Status: DC

## 2019-01-04 MED ORDER — AMLODIPINE BESYLATE 5 MG PO TABS: 5.0000 mg | ORAL_TABLET | Freq: Every day | ORAL | 0 refills | Status: DC

## 2019-01-04 MED ORDER — ATORVASTATIN CALCIUM 40 MG PO TABS: 40.0000 mg | ORAL_TABLET | Freq: Every day | ORAL | 0 refills | Status: DC

## 2019-01-04 MED ORDER — ASPIRIN 81 MG PO CHEW: 81.0000 mg | CHEWABLE_TABLET | Freq: Every day | ORAL | 0 refills | Status: DC

## 2019-01-04 MED ORDER — CLOPIDOGREL BISULFATE 75 MG PO TABS: 75.0000 mg | ORAL_TABLET | Freq: Every day | ORAL | 0 refills | Status: AC

## 2019-01-04 MED ORDER — ATORVASTATIN CALCIUM 40 MG PO TABS: 40.0000 mg | ORAL_TABLET | Freq: Every day | ORAL | 0 refills | Status: AC

## 2019-01-04 MED ORDER — ASPIRIN 81 MG PO CHEW: 81.0000 mg | CHEWABLE_TABLET | Freq: Every day | ORAL | 0 refills | Status: AC

## 2019-01-04 MED ORDER — SENNOSIDES-DOCUSATE SODIUM 8.6-50 MG PO TABS: 1.0000 | ORAL_TABLET | Freq: Two times a day (BID) | ORAL | 0 refills | Status: AC

## 2019-01-04 MED ORDER — METOPROLOL TARTRATE 25 MG PO TABS: 25.0000 mg | ORAL_TABLET | Freq: Two times a day (BID) | ORAL | 0 refills | Status: AC

## 2019-01-04 NOTE — Discharge Instructions (Signed)
It was so nice to meet you during this hospitalization!  You came into the hospital with a stroke. We also found that you had a mass in your brain called a meningioma. This is usually a benign tumor and is not cancerous. We also found that one of the main arteries in the back of your head was narrowed.  I have prescribed the following medications: 1. Aspirin 81mg  daily- this is a blood thinner to help make sure you brain is getting good blood flow 2. Plavix 75mg  daily- this is another blood thinner. You will need to take this for 3 weeks and then stop it. 3. Lipitor 40mg  daily- this is a cholesterol medicine. We found that your cholesterol was high. 4. Lisinopril 20mg  daily- this is a blood pressure medicine 5. Hydrochlorothiazide 25mg  daily- this is a blood pressure medicine 6. Norvasc 5mg  daily- this is a blood pressure medicine 7. Metoprolol 25mg  twice a day- this is a blood pressure medicine 8. I have also prescribed a stool softener called Senokot. You can take this twice a day to help with your constipation.  You will need to follow-up with the following people when you leave the hospital 1. You will need to see the neurologist at [facility] for your stroke 2. You will need to see the interventional radiologist (Dr. Markus Daft) in [place] in 1-2 weeks to discuss that narrow artery in the back of your head. You may need to have a stent placed to open it back up. 3. You will need to see the neurosurgeon (Dr. Deetta Perla) in 4 weeks to discuss the mass in your brain.  Take care, Dr. Brett Albino

## 2019-01-04 NOTE — TOC Transition Note (Signed)
Transition of Care [facility]) - CM/SW Discharge Note   Patient Details  Name: ALPHEUS MERRIMAN MRN: [id number] Date of Birth: 10/14/1960  [facility] CM/SW Contact:  Shelbie Hutching, RN Phone Number: 01/04/2019, 2:19 PM   Clinical Narrative:    Home Health arranged with [facility]  Jana Half aware of patient discharge today.  Patient will pick up prescriptions from Medication Management.    Final next level of care: Home w Home Health Services Barriers to Discharge: Barriers Resolved   Patient Goals and CMS Choice   CMS Medicare.gov Compare Post Acute Care list provided to:: Patient Choice offered to / list presented to : Patient  Discharge Placement                       Discharge Plan and Services   Discharge Planning Services: CM Consult, [place] Acute Care Choice: Home Health                    HH Arranged: RN, PT, OT [facility] Agency: [facility] Date [facility] Agency Contacted: 01/04/19 Time [place]: [phone] Representative spoke with at [place]: [place] ([place]) Interventions     Readmission Risk Interventions No flowsheet data found.

## 2019-01-04 NOTE — Plan of Care (Signed)
  Problem: Education: Goal: Knowledge of secondary prevention will improve Outcome: Progressing   Problem: Education: Goal: Knowledge of patient specific risk factors addressed and post discharge goals established will improve Outcome: Progressing   Problem: Coping: Goal: Will verbalize positive feelings about self Outcome: Progressing   Problem: Coping: Goal: Will identify appropriate support needs Outcome: Progressing   Problem: Self-Care: Goal: Ability to participate in self-care as condition permits will improve Outcome: Progressing   Problem: Nutrition: Goal: Risk of aspiration will decrease Outcome: Progressing   Problem: Ischemic Stroke/TIA Tissue Perfusion: Goal: Complications of ischemic stroke/TIA will be minimized Outcome: Progressing

## 2019-01-04 NOTE — Evaluation (Signed)
Physical Therapy Evaluation Patient Details Name: Paul Leonard MRN: [id number] DOB: November 30, 1960 Today's Date: 01/04/2019   History of Present Illness  58 y.o. male with a known history of hypertension.  The patient presented ED with above chief complaint.  The patient started to have left arm and leg weakness and numbness 3 days ago.  He also complains of headache, dizziness, speech changes.  He denies any incontinence or loss of consciousness. MRI brain report acute infarct and Meningioma  Clinical Impression  Pt is a pleasant 58 year old M who was admitted for acute CVA affecting R cerebellum and pons. Upon entry, pt is seated EOB. RN entered room to take vitals and give medication. Pt performs bed mobility, transfers, and ambulation with supervision/CGA and use of RW. Attempted standing without use of walker and pt reached for the walker and stated it makes him feel more steady. Pt demonstrates deficits with strength, coordination, and mobility. Pt will benefit from skilled PT to address above deficits; current follow-up care recommendation is HHPT.      Follow Up Recommendations Home health PT    Equipment Recommendations  None recommended by PT    Recommendations for Other Services       Precautions / Restrictions Precautions Precautions: Fall Restrictions Weight Bearing Restrictions: No      Mobility  Bed Mobility Overal bed mobility: Needs Assistance Bed Mobility: Supine to Sit     Supine to sit: Supervision     General bed mobility comments: Pt able to reach EOB with supervision. HOB raised slightly, minimal effort required  Transfers Overall transfer level: Needs assistance Equipment used: Rolling walker (2 wheeled) Transfers: Sit to/from Stand Sit to Stand: Min guard         General transfer comment: pt able to stand with min guard assist with use of RW. Attempted without RW, and was able to perform but immediately reached for countertop when  upright.  Ambulation/Gait Ambulation/Gait assistance: Min guard Gait Distance (Feet): 50 Feet Assistive device: Rolling walker (2 wheeled) Gait Pattern/deviations: Step-through pattern;Decreased stance time - left     General Gait Details: Pt able to amb with use of RW. Reduced stand time present on LLE 2/2 weakness. Pt amb with increased time required  Stairs            Wheelchair Mobility    Modified Rankin (Stroke Patients Only)       Balance Overall balance assessment: Needs assistance Sitting-balance support: Feet supported Sitting balance-Leahy Scale: Good     Standing balance support: Bilateral upper extremity supported Standing balance-Leahy Scale: Good                               Pertinent Vitals/Pain Pain Assessment: No/denies pain    Home Living Family/patient expects to be discharged to:: Private residence Living Arrangements: Spouse/significant other Available Help at Discharge: Family;Available PRN/intermittently Type of Home: Apartment Home Access: Level entry(back entrance for level entry; front entry 10 stairs R rail)     Home Layout: One level Home Equipment: Walker - 4 wheels Additional Comments: Pt reports that he previously did not use any AD's to amb, but has a rollator at home    Prior Function Level of Independence: Independent         Comments: Pt reports to be independent in mobility and ADLs     Hand Dominance   Dominant Hand: Right    Extremity/Trunk Assessment   Upper  Extremity Assessment Upper Extremity Assessment: LUE deficits/detail(R WNL) LUE Deficits / Details: General weakness in L UE 4/5; able to raise arm. Grip weakened compared to R side.     Lower Extremity Assessment Lower Extremity Assessment: LLE deficits/detail(RLE WNL) LLE Deficits / Details: L side generally weaker than R, Grossly 4/5 MMT       Communication   Communication: Expressive difficulties(speach minorly slurred)   Cognition Arousal/Alertness: Awake/alert Behavior During Therapy: WFL for tasks assessed/performed Overall Cognitive Status: Within Functional Limits for tasks assessed                                 General Comments: alert and oriented, follows commands      General Comments General comments (skin integrity, edema, etc.): BP at EOB to start session 168/101. At end of session following bed exercises and bouts of amb, BP 146/92. Vision WNL bilaterally. UE and LE coordination decreased on L side with jerkiness and slightly off target with F->N test. R side coordination WNL.    Exercises Other Exercises Other Exercises: Supine: AP, QS, SLR; PT supervision Other Exercises: Seated in recliner: march, LAQ; PT supervision Other Exercises: Pt performs toileting activities independent with use of RW for amb   Assessment/Plan    PT Assessment Patient needs continued PT services  PT Problem List Decreased strength;Decreased activity tolerance;Decreased balance;Decreased mobility;Decreased coordination;Cardiopulmonary status limiting activity       PT Treatment Interventions DME instruction;Gait training;Stair training;Functional mobility training;Therapeutic activities;Therapeutic exercise;Balance training;Patient/family education    PT Goals (Current goals can be found in the Care Plan section)  Acute Rehab PT Goals Patient Stated Goal: go home PT Goal Formulation: With patient Time For Goal Achievement: 2019/02/14 Potential to Achieve Goals: Good    Frequency 7X/week   Barriers to discharge        Co-evaluation               AM-PAC PT "6 Clicks" Mobility  Outcome Measure Help needed turning from your back to your side while in a flat bed without using bedrails?: None Help needed moving from lying on your back to sitting on the side of a flat bed without using bedrails?: None Help needed moving to and from a bed to a chair (including a wheelchair)?: A  Little Help needed standing up from a chair using your arms (e.g., wheelchair or bedside chair)?: A Little Help needed to walk in hospital room?: A Little Help needed climbing 3-5 steps with a railing? : A Lot 6 Click Score: 19    End of Session Equipment Utilized During Treatment: Gait belt Activity Tolerance: Patient tolerated treatment well Patient left: in chair;with chair alarm set;with call bell/phone within reach Nurse Communication: Mobility status PT Visit Diagnosis: Muscle weakness (generalized) (M62.81);Other symptoms and signs involving the nervous system (R29.898);Difficulty in walking, not elsewhere classified (R26.2)    Time: [id number] PT Time Calculation (min) (ACUTE ONLY): 37 min   Charges:              Dixie Dials, SPT   Kelley Knoth 01/04/2019, 12:21 PM

## 2019-01-04 NOTE — TOC Transition Note (Signed)
Transition of Care [facility]) - CM/SW Discharge Note   Patient Details  Name: Paul Leonard MRN: [id number] Date of Birth: 03/09/1961  [facility] CM/SW Contact:  Shelbie Hutching, RN Phone Number: 01/04/2019, 12:05 PM   Clinical Narrative:     Patient has been medically cleared for discharge today.  Prescription medications have been faxed to [facility] and patient has been given the application to fill out.  Referral already sent over to [facility] yesterday.  All follow up appointments have been scheduled, PCP appointment is Tuesday Oct 27th at 1pm.  Patient has been given a list of [employer] for food assistance, housing, rent, and utility assistance.  Patient reports that his daughter will come and pick him up.  Patient will pick up prescriptions once discharged.  RNCM waiting to see PT and OT recommendations before setting up home health services or outpatient rehab.      Barriers to Discharge: Continued Medical Work up   Patient Goals and CMS Choice   CMS Medicare.gov Compare Post Acute Care list provided to:: Patient Choice offered to / list presented to : Patient  Discharge Placement                       Discharge Plan and Services   Discharge Planning Services: CM Consult, [facility] Post Acute Care Choice: Home Health                               Social Determinants of Health (SDOH) Interventions     Readmission Risk Interventions No flowsheet data found.

## 2019-01-04 NOTE — Discharge Summary (Addendum)
[facility] NAME: Khyir Sowells    MR#:  [id number]  DATE OF BIRTH:  Apr 25, 1960  DATE OF ADMISSION:  01/01/2019   ADMITTING PHYSICIAN: Demetrios Loll, MD  DATE OF DISCHARGE: 01/04/19  PRIMARY CARE PHYSICIAN: Patient, No Pcp Per   ADMISSION DIAGNOSIS:  Brain mass [G93.89] Cerebrovascular accident (CVA) due to thrombosis of precerebral artery ([place]) [I63.00] DISCHARGE DIAGNOSIS:  Active Problems:   Acute CVA (cerebrovascular accident) ([place])  SECONDARY DIAGNOSIS:   Past Medical History:  Diagnosis Date   Hypertension    HOSPITAL COURSE:   Keylin is a 58 year old male who presented to the ED with left-sided weakness and numbness.  In the ED, showed a 4 cm mass at the right parasagittal vertex with mild brain edema.  MRI brain with small acute infarcts in the right pons and right cerebellum with additional small subacute and chronic right cerebellar infarcts, in addition to a 2 cm right parietal mass consistent with a meningioma.  He was admitted for further management.  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 Lacinda Axon in 4-6 weeks  Uncontrolled hypertension- BP was high throughout admission but improved on the day of discharge -Patient was discharged home on Norvasc 5 mg daily, HCTZ 25 mg daily, lisinopril 20 mg daily, metoprolol 25 mg twice  daily -Home health RN ordered for home BP monitoring  Periumbilical abdominal pain- resolved.  Likely due to constipation. -KUB 10/21 showed mildly dilated small bowel loops, cannot exclude obstruction -CT abdomen/pelvis was unremarkable, except for moderate colorectal stool volume  3 mm pulmonary nodule in the left lung base-incidental finding on CT abdomen/pelvis -Patient will need repeat noncontrast CT chest in 12 months  Tobacco use -Continue nicotine patch  DISCHARGE CONDITIONS:  Acute CVA Right parietal meningioma Uncontrolled hypertension Pulmonary nodule in the left lung base Tobacco use CONSULTS OBTAINED:  Treatment Team:  Catarina Hartshorn, MD DRUG ALLERGIES:  No Known Allergies DISCHARGE MEDICATIONS:   Allergies as of 01/04/2019   No Known Allergies     Medication List    STOP taking these medications   amoxicillin 500 MG tablet Commonly known as: AMOXIL   traMADol 50 MG tablet Commonly known as: Ultram     TAKE these medications   amLODipine 5 MG tablet Commonly known as: NORVASC Take 1 tablet (5 mg total) by mouth daily. Start taking on: January 05, 2019   aspirin 81 MG chewable tablet Chew 1 tablet (81 mg total) by mouth daily. Start taking on: January 05, 2019   atorvastatin 40 MG tablet Commonly known as: LIPITOR Take 1 tablet (40 mg total) by mouth daily at 6 PM.   clopidogrel 75 MG tablet Commonly known as: PLAVIX Take 1 tablet (75 mg total) by mouth daily. Start taking on: January 05, 2019   hydrochlorothiazide 25 MG tablet Commonly known as: HYDRODIURIL Take 1 tablet (25 mg total) by mouth daily. Start taking on: January 05, 2019  lisinopril 20 MG tablet Commonly known as: ZESTRIL Take 1 tablet (20 mg total) by mouth daily. Start taking on: January 05, 2019   metoprolol tartrate 25 MG tablet Commonly known as: LOPRESSOR Take 1 tablet (25 mg total) by mouth 2 (two) times daily.   senna-docusate 8.6-50 MG tablet Commonly  known as: Senokot-S Take 1 tablet by mouth 2 (two) times daily.            Durable Medical Equipment  (From admission, onward)         Start     Ordered   01/03/19 0714  For home use only DME 3 n 1  Once     01/03/19 0713           DISCHARGE INSTRUCTIONS:  1.  Follow-up with PCP in 5 days 2.  Follow-up with neurology in 2 weeks 3.  Follow-up with IR in 1-2 weeks 4.  Follow-up with neurosurgery in 4 weeks 5.  Take aspirin 81 mg daily and Plavix 75 mg daily for 3 weeks, then take aspirin 81 mg daily after that 6.  Take hydrochlorothiazide, Norvasc, lisinopril, metoprolol as prescribed 7.  Take Lipitor 40 mg daily DIET:  Cardiac diet DISCHARGE CONDITION:  Stable ACTIVITY:  Activity as tolerated OXYGEN:  Home Oxygen: No.  Oxygen Delivery: room air DISCHARGE LOCATION:  home   If you experience worsening of your admission symptoms, develop shortness of breath, life threatening emergency, suicidal or homicidal thoughts you must seek medical attention immediately by calling 911 or calling your MD immediately  if symptoms less severe.  You Must read complete instructions/literature along with all the possible adverse reactions/side effects for all the Medicines you take and that have been prescribed to you. Take any new Medicines after you have completely understood and accpet all the possible adverse reactions/side effects.   Please note  You were cared for by a hospitalist during your hospital stay. If you have any questions about your discharge medications or the care you received while you were in the hospital after you are discharged, you can call the unit and asked to speak with the hospitalist on call if the hospitalist that took care of you is not available. Once you are discharged, your primary care physician will handle any further medical issues. Please note that NO REFILLS for any discharge medications will be authorized once you are discharged, as it is imperative  that you return to your primary care physician (or establish a relationship with a primary care physician if you do not have one) for your aftercare needs so that they can reassess your need for medications and monitor your lab values.    On the day of Discharge:  VITAL SIGNS:  Blood pressure (!) 168/101, pulse 63, temperature 98.3 F (36.8 C), temperature source Oral, resp. rate 18, height 5\' 10"  (1.778 m), weight 83.9 kg, SpO2 100 %. PHYSICAL EXAMINATION:  GENERAL:Laying in the bed with no acute distress.  HEENT: Head atraumatic, normocephalic. Pupils equal, round, reactive to light and accommodation. No scleral icterus. Extraocular muscles intact. Oropharynx and nasopharynx clear.  NECK: Supple, no jugular venous distention. No thyroid enlargement. LUNGS:Lungs are clear to auscultation bilaterally. No wheezes, crackles, rhonchi.No use of accessory muscles of respiration.  CARDIOVASCULAR: RRR, S1, S2 normal. No murmurs, rubs, or gallops.  ABDOMEN: Soft, nondistended. Bowel sounds present. +tenderness to palpation of the periumbilical region. EXTREMITIES: No pedal edema, cyanosis, or clubbing.  NEUROLOGIC:CN 2-12 intact, no focal deficits. 5/5 muscle strength in the right  upper extremity and right lower extremity.  4/5 muscle strength in the left upper extremity and left lower extremity. + Diminished sensation to light touch in the left arm and leg. Gait not checked.  PSYCHIATRIC: The patient is alert and oriented x 3.  SKIN: No obvious rash, lesion, or ulcer.  DATA REVIEW:   CBC Recent Labs  Lab 01/01/19 1129  WBC 4.5  HGB 15.1  HCT 48.0  PLT 287    Chemistries  Recent Labs  Lab 01/01/19 1129 01/04/19 0711  NA 142 140  K 3.5 3.9  CL 107 102  CO2 28 27  GLUCOSE 104* 153*  BUN 15 18  CREATININE 0.87 0.97  CALCIUM 8.9 8.8*  AST 15  --   ALT 15  --   ALKPHOS 55  --   BILITOT 0.7  --      Microbiology Results  Results for orders placed or performed during the  hospital encounter of 01/01/19  SARS CORONAVIRUS 2 (TAT 6-24 HRS) Nasopharyngeal Nasopharyngeal Swab     Status: None   Collection Time: 01/01/19  7:22 PM   Specimen: Nasopharyngeal Swab  Result Value Ref Range Status   SARS Coronavirus 2 NEGATIVE NEGATIVE Final    Comment: (NOTE) SARS-CoV-2 target nucleic acids are NOT DETECTED. The SARS-CoV-2 RNA is generally detectable in upper and lower respiratory specimens during the acute phase of infection. Negative results do not preclude SARS-CoV-2 infection, do not rule out co-infections with other pathogens, and should not be used as the sole basis for treatment or other patient management decisions. Negative results must be combined with clinical observations, patient history, and epidemiological information. The expected result is Negative. Fact Sheet for Patients: SugarRoll.be Fact Sheet for Healthcare Providers: https://www.woods-mathews.com/ This test is not yet approved or cleared by the [place] FDA and  has been authorized for detection and/or diagnosis of SARS-CoV-2 by FDA under an Emergency Use Authorization (EUA). This EUA will remain  in effect (meaning this test can be used) for the duration of the COVID-19 declaration under Section 56 4(b)(1) of the Act, 21 U.S.C. section 360bbb-3(b)(1), unless the authorization is terminated or revoked sooner. Performed at [facility], [place] [address] [place], [place] [address]     RADIOLOGY:  Ct Abdomen Pelvis W Contrast  Result Date: 01/03/2019 CLINICAL DATA:  Inpatient. Acute periumbilical abdominal pain today. EXAM: CT ABDOMEN AND PELVIS WITH CONTRAST TECHNIQUE: Multidetector CT imaging of the abdomen and pelvis was performed using the standard protocol following bolus administration of intravenous contrast. CONTRAST:  174mL OMNIPAQUE IOHEXOL 300 MG/ML  SOLN COMPARISON:  Abdominal radiograph from earlier today. FINDINGS: Lower  chest: Peripheral left lower lobe 3 mm solid pulmonary nodule (series 4/image 9). Coronary atherosclerosis. Hepatobiliary: Normal liver size. No liver mass. Normal gallbladder with no radiopaque cholelithiasis. No biliary ductal dilatation. Pancreas: Normal, with no mass or duct dilation. Spleen: Normal size. No mass. Adrenals/Urinary Tract: Normal adrenals. Normal kidneys with no hydronephrosis and no renal mass. Normal bladder. Stomach/Bowel: Normal non-distended stomach. Normal caliber small bowel with no small bowel wall thickening. Oral contrast transits to the pelvic small bowel. Normal appendix. Moderate colorectal stool volume. No large bowel wall thickening, acute pericolonic fat stranding or significant colonic diverticulosis. Vascular/Lymphatic: Atherosclerotic nonaneurysmal abdominal aorta. Patent portal, splenic, hepatic and renal veins. No pathologically enlarged lymph nodes in the abdomen or pelvis. Reproductive: Top-normal size prostate with nonspecific internal prostatic calcifications. Other: No pneumoperitoneum, ascites or focal fluid collection. Musculoskeletal: No aggressive appearing focal osseous lesions.  Moderate thoracolumbar spondylosis. IMPRESSION: 1. No acute abnormality. No evidence of bowel obstruction or acute bowel inflammation. Normal appendix. 2. Moderate colorectal stool volume may indicate constipation. 3. Left lung base 3 mm solid pulmonary nodule. No follow-up needed if patient is low-risk. Non-contrast chest CT can be considered in 12 months if patient is high-risk. This recommendation follows the consensus statement: Guidelines for Management of Incidental Pulmonary Nodules Detected on CT Images:From the Fleischner Society 2017; published online before print (10.1148/radiol.SG:5268862). 4.  Aortic Atherosclerosis (ICD10-I70.0). Electronically Signed   By: Ilona Sorrel M.D.   On: 01/03/2019 15:59     Management plans discussed with the patient, family and they are in  agreement.  CODE STATUS: Full Code   TOTAL TIME TAKING CARE OF THIS PATIENT: 45 minutes.    Berna Spare Kiaira Pointer M.D on 01/04/2019 at 10:09 AM  Between 7am to 6pm - Pager - [phone]  After 6pm go to [email or web address] - Proofreader  [facility]  Office  [phone]  CC: Primary care physician; Patient, No Pcp Per   Note: This dictation was prepared with Dragon dictation along with smaller phrase technology. Any transcriptional errors that result from this process are unintentional.

## 2019-01-08 ENCOUNTER — Inpatient Hospital Stay
Admission: EM | Admit: 2019-01-08 | Discharge: 2019-02-13 | DRG: 064 | Disposition: E | Payer: Medicaid Other | Attending: Family Medicine | Admitting: Family Medicine

## 2019-01-08 ENCOUNTER — Emergency Department: Payer: Medicaid Other

## 2019-01-08 ENCOUNTER — Encounter: Admission: EM | Disposition: E | Payer: Self-pay | Source: Home / Self Care | Attending: Internal Medicine

## 2019-01-08 ENCOUNTER — Other Ambulatory Visit: Payer: Self-pay

## 2019-01-08 ENCOUNTER — Encounter: Payer: Self-pay | Admitting: Emergency Medicine

## 2019-01-08 ENCOUNTER — Emergency Department: Admit: 2019-01-08 | Payer: Self-pay | Admitting: Cardiovascular Disease

## 2019-01-08 ENCOUNTER — Inpatient Hospital Stay
Admission: AD | Admit: 2019-01-08 | Payer: Self-pay | Source: Other Acute Inpatient Hospital | Admitting: Internal Medicine

## 2019-01-08 DIAGNOSIS — I422 Other hypertrophic cardiomyopathy: Secondary | ICD-10-CM | POA: Diagnosis present

## 2019-01-08 DIAGNOSIS — R4701 Aphasia: Secondary | ICD-10-CM | POA: Diagnosis present

## 2019-01-08 DIAGNOSIS — Z79899 Other long term (current) drug therapy: Secondary | ICD-10-CM

## 2019-01-08 DIAGNOSIS — Z8673 Personal history of transient ischemic attack (TIA), and cerebral infarction without residual deficits: Secondary | ICD-10-CM

## 2019-01-08 DIAGNOSIS — D320 Benign neoplasm of cerebral meninges: Secondary | ICD-10-CM | POA: Diagnosis present

## 2019-01-08 DIAGNOSIS — R2990 Unspecified symptoms and signs involving the nervous system: Secondary | ICD-10-CM

## 2019-01-08 DIAGNOSIS — I10 Essential (primary) hypertension: Secondary | ICD-10-CM | POA: Diagnosis present

## 2019-01-08 DIAGNOSIS — G8194 Hemiplegia, unspecified affecting left nondominant side: Secondary | ICD-10-CM | POA: Diagnosis present

## 2019-01-08 DIAGNOSIS — Z4659 Encounter for fitting and adjustment of other gastrointestinal appliance and device: Secondary | ICD-10-CM

## 2019-01-08 DIAGNOSIS — J9601 Acute respiratory failure with hypoxia: Secondary | ICD-10-CM

## 2019-01-08 DIAGNOSIS — I639 Cerebral infarction, unspecified: Secondary | ICD-10-CM | POA: Diagnosis present

## 2019-01-08 DIAGNOSIS — I248 Other forms of acute ischemic heart disease: Secondary | ICD-10-CM | POA: Diagnosis present

## 2019-01-08 DIAGNOSIS — R1310 Dysphagia, unspecified: Secondary | ICD-10-CM | POA: Diagnosis present

## 2019-01-08 DIAGNOSIS — F17210 Nicotine dependence, cigarettes, uncomplicated: Secondary | ICD-10-CM | POA: Diagnosis present

## 2019-01-08 DIAGNOSIS — Z7902 Long term (current) use of antithrombotics/antiplatelets: Secondary | ICD-10-CM

## 2019-01-08 DIAGNOSIS — Z20828 Contact with and (suspected) exposure to other viral communicable diseases: Secondary | ICD-10-CM | POA: Diagnosis present

## 2019-01-08 DIAGNOSIS — K5900 Constipation, unspecified: Secondary | ICD-10-CM | POA: Diagnosis present

## 2019-01-08 DIAGNOSIS — Z01818 Encounter for other preprocedural examination: Secondary | ICD-10-CM

## 2019-01-08 DIAGNOSIS — Z7982 Long term (current) use of aspirin: Secondary | ICD-10-CM

## 2019-01-08 DIAGNOSIS — Z515 Encounter for palliative care: Secondary | ICD-10-CM

## 2019-01-08 DIAGNOSIS — E860 Dehydration: Secondary | ICD-10-CM | POA: Diagnosis present

## 2019-01-08 DIAGNOSIS — I6312 Cerebral infarction due to embolism of basilar artery: Principal | ICD-10-CM | POA: Diagnosis present

## 2019-01-08 DIAGNOSIS — R9431 Abnormal electrocardiogram [ECG] [EKG]: Secondary | ICD-10-CM

## 2019-01-08 DIAGNOSIS — E785 Hyperlipidemia, unspecified: Secondary | ICD-10-CM | POA: Diagnosis present

## 2019-01-08 DIAGNOSIS — G931 Anoxic brain damage, not elsewhere classified: Secondary | ICD-10-CM | POA: Diagnosis present

## 2019-01-08 DIAGNOSIS — Z66 Do not resuscitate: Secondary | ICD-10-CM | POA: Diagnosis present

## 2019-01-08 DIAGNOSIS — J9602 Acute respiratory failure with hypercapnia: Secondary | ICD-10-CM

## 2019-01-08 DIAGNOSIS — J690 Pneumonitis due to inhalation of food and vomit: Secondary | ICD-10-CM | POA: Diagnosis present

## 2019-01-08 DIAGNOSIS — Z0189 Encounter for other specified special examinations: Secondary | ICD-10-CM

## 2019-01-08 DIAGNOSIS — E876 Hypokalemia: Secondary | ICD-10-CM | POA: Diagnosis present

## 2019-01-08 DIAGNOSIS — T17908A Unspecified foreign body in respiratory tract, part unspecified causing other injury, initial encounter: Secondary | ICD-10-CM

## 2019-01-08 DIAGNOSIS — F419 Anxiety disorder, unspecified: Secondary | ICD-10-CM | POA: Diagnosis present

## 2019-01-08 LAB — COMPREHENSIVE METABOLIC PANEL
ALT: 21 U/L (ref 0–44)
AST: 16 U/L (ref 15–41)
Albumin: 3.8 g/dL (ref 3.5–5.0)
Alkaline Phosphatase: 60 U/L (ref 38–126)
Anion gap: 12 (ref 5–15)
BUN: 25 mg/dL — ABNORMAL HIGH (ref 6–20)
CO2: 30 mmol/L (ref 22–32)
Calcium: 9.2 mg/dL (ref 8.9–10.3)
Chloride: 99 mmol/L (ref 98–111)
Creatinine, Ser: 1.21 mg/dL (ref 0.61–1.24)
GFR calc Af Amer: >60 mL/min (ref >60)
GFR calc non Af Amer: >60 mL/min (ref >60)
Glucose, Bld: 110 mg/dL — ABNORMAL HIGH (ref 70–99)
Potassium: 3.6 mmol/L (ref 3.5–5.1)
Sodium: 141 mmol/L (ref 135–145)
Total Bilirubin: 1.2 mg/dL (ref 0.3–1.2)
Total Protein: 7 g/dL (ref 6.5–8.1)

## 2019-01-08 LAB — CBC WITH DIFFERENTIAL/PLATELET
Abs Immature Granulocytes: 0.03 10*3/uL (ref 0.00–0.07)
Basophils Absolute: 0 10*3/uL (ref 0.0–0.1)
Basophils Relative: 0 %
Eosinophils Absolute: 0.1 10*3/uL (ref 0.0–0.5)
Eosinophils Relative: 1 %
HCT: 53.7 % — ABNORMAL HIGH (ref 39.0–52.0)
Hemoglobin: 17.4 g/dL — ABNORMAL HIGH (ref 13.0–17.0)
Immature Granulocytes: 0 %
Lymphocytes Relative: 25 %
Lymphs Abs: 1.8 10*3/uL (ref 0.7–4.0)
MCH: 28.4 pg (ref 26.0–34.0)
MCHC: 32.4 g/dL (ref 30.0–36.0)
MCV: 87.7 fL (ref 80.0–100.0)
Monocytes Absolute: 0.8 10*3/uL (ref 0.1–1.0)
Monocytes Relative: 11 %
Neutro Abs: 4.6 10*3/uL (ref 1.7–7.7)
Neutrophils Relative %: 63 %
Platelets: 353 10*3/uL (ref 150–400)
RBC: 6.12 MIL/uL — ABNORMAL HIGH (ref 4.22–5.81)
RDW: 12.9 % (ref 11.5–15.5)
WBC: 7.3 10*3/uL (ref 4.0–10.5)
nRBC: 0 % (ref 0.0–0.2)

## 2019-01-08 LAB — URINALYSIS, COMPLETE (UACMP) WITH MICROSCOPIC
Bacteria, UA: NONE SEEN
Bilirubin Urine: NEGATIVE
Glucose, UA: NEGATIVE mg/dL
Hgb urine dipstick: NEGATIVE
Ketones, ur: NEGATIVE mg/dL
Leukocytes,Ua: NEGATIVE
Nitrite: NEGATIVE
Protein, ur: NEGATIVE mg/dL
Specific Gravity, Urine: 1.026 (ref 1.005–1.030)
Squamous Epithelial / HPF: NONE SEEN (ref 0–5)
pH: 5 (ref 5.0–8.0)

## 2019-01-08 LAB — TROPONIN I (HIGH SENSITIVITY)
Troponin I (High Sensitivity): 8 ng/L (ref <18)
Troponin I (High Sensitivity): 10 ng/L (ref <18)

## 2019-01-08 LAB — PROTIME-INR
INR: 1.1 (ref 0.8–1.2)
Prothrombin Time: 14.2 seconds (ref 11.4–15.2)

## 2019-01-08 LAB — SARS CORONAVIRUS 2 BY RT PCR (HOSPITAL ORDER, PERFORMED IN ~~LOC~~ HOSPITAL LAB): SARS Coronavirus 2: NEGATIVE

## 2019-01-08 SURGERY — CORONARY/GRAFT ACUTE MI REVASCULARIZATION
Anesthesia: Moderate Sedation

## 2019-01-08 MED ORDER — ASPIRIN 300 MG RE SUPP
300.0000 mg | Freq: Once | RECTAL | Status: AC
  Administered 2019-01-08: 20:00:00 300 mg via RECTAL
  Filled 2019-01-08: qty 1

## 2019-01-08 MED ORDER — SODIUM CHLORIDE 0.9 % IV SOLN
Freq: Once | INTRAVENOUS | Status: AC
  Administered 2019-01-08: 19:00:00 via INTRAVENOUS

## 2019-01-08 MED ORDER — SODIUM CHLORIDE 0.9 % IV SOLN
Freq: Once | INTRAVENOUS | Status: AC
  Administered 2019-01-08: 16:00:00 via INTRAVENOUS

## 2019-01-08 NOTE — ED Notes (Signed)
Pt's mouth suctioned; lots of saliva in mouth, he feels like he can't breathe when in reclining position; hob raised.

## 2019-01-08 NOTE — ED Notes (Signed)
Dt arida at bedside

## 2019-01-08 NOTE — ED Notes (Signed)
Pt placed in hospital bed. Tolerated well. Provided for comfort and safety and will continue to assess. Family member remains at the bedside.

## 2019-01-08 NOTE — ED Provider Notes (Signed)
Patient received in signout from Dr. Ellender Hose.  Patient does have evidence of new stroke.  Dr.  Doy Mince discussed with neuro hospitalist,  Dr. Leonel Ramsay, at [facility] and they have accepted to their facility but currently there are no beds available.  Patient remains hemodynamically stable without any pain.  Currently dealing with critical bed and staffing shortages at this hospital and in this region.  Will work on alternative bed placement options, however patient will be likely remain in the ER until room count opens up.  Will continue to monitor.   Merlyn Lot, MD 12/26/2018 [phone]

## 2019-01-08 NOTE — ED Notes (Signed)
Pt's sister at bedside; states pt has been tired and weak since getting home from hospital, but that he is much worse today. She also reports that he has had lots of phlegm and they are cautious regarding food and drink due to the risk of aspiration. Pt denies pain at this time.

## 2019-01-08 NOTE — ED Provider Notes (Signed)
[facility] Emergency Department Provider Note  ____________________________________________   First MD Initiated Contact with Patient 12/21/2018 1347     (approximate)  I have reviewed the triage vital signs and the nursing notes.   HISTORY  Chief Complaint Code STEMI    HPI Paul Leonard is a 58 y.o. male  With h/o HTN, recent CVA here with AMS.  The patient was just hospitalized at [facility] following admission for acute CVA with left-sided weakness and speech difficulty that had significantly improved.  He was also found to have a meningioma and was started on Decadron.  Per report, he had been doing very well and was recently discharged.  Over the last day, however, family states that he has gotten acutely worse.  He had an overall good day yesterday, but this morning, woke up and was very drowsy.  He did not want to eat.  His speech was more slurred.  He also did not want to get off the couch.  He has been very drowsy and somewhat confused today.  The symptoms seem worse than when he initially presented prior to his recent hospitalization.  On my assessment, he denies any complaints.  Denies any chest pain.  Of note, his EKG showed possible ST elevation so he was activated as a code STEMI in route.        Past Medical History:  Diagnosis Date  . Hypertension     Patient Active Problem List   Diagnosis Date Noted  . Acute CVA (cerebrovascular accident) ([place]) 01/01/2019    History reviewed. No pertinent surgical history.  Prior to Admission medications   Medication Sig Start Date End Date Taking? Authorizing Provider  amLODipine (NORVASC) 5 MG tablet Take 1 tablet (5 mg total) by mouth daily. 01/05/19   Mayo, Pete Pelt, MD  aspirin 81 MG chewable tablet Chew 1 tablet (81 mg total) by mouth daily. 01/05/19   Mayo, Pete Pelt, MD  atorvastatin (LIPITOR) 40 MG tablet Take 1 tablet (40 mg total) by mouth daily at 6 PM. 01/04/19   Mayo, Pete Pelt, MD   clopidogrel (PLAVIX) 75 MG tablet Take 1 tablet (75 mg total) by mouth daily. 01/05/19   Mayo, Pete Pelt, MD  hydrochlorothiazide (HYDRODIURIL) 25 MG tablet Take 1 tablet (25 mg total) by mouth daily. 01/05/19   Mayo, Pete Pelt, MD  lisinopril (ZESTRIL) 20 MG tablet Take 1 tablet (20 mg total) by mouth daily. 01/05/19   Mayo, Pete Pelt, MD  metoprolol tartrate (LOPRESSOR) 25 MG tablet Take 1 tablet (25 mg total) by mouth 2 (two) times daily. 01/04/19   Mayo, Pete Pelt, MD  senna-docusate (SENOKOT-S) 8.6-50 MG tablet Take 1 tablet by mouth 2 (two) times daily. 01/04/19   Mayo, Pete Pelt, MD    Allergies Patient has no known allergies.  History reviewed. No pertinent family history.  Social History Social History   Tobacco Use  . Smoking status: Current Every Day Smoker    Packs/day: 1.00    Types: Cigarettes  . Smokeless tobacco: Never Used  Substance Use Topics  . Alcohol use: Yes    Alcohol/week: 1.0 standard drinks    Types: 1 Cans of beer per week  . Drug use: Yes    Types: Cocaine    Comment: Last used 10/16    Review of Systems  Review of Systems  Constitutional: Positive for fatigue. Negative for chills and fever.  HENT: Negative for sore throat.   Respiratory: Negative for shortness of  breath.   Cardiovascular: Negative for chest pain.  Gastrointestinal: Negative for abdominal pain.  Genitourinary: Negative for flank pain.  Musculoskeletal: Negative for neck pain.  Skin: Negative for rash and wound.  Allergic/Immunologic: Negative for immunocompromised state.  Neurological: Positive for speech difficulty and weakness. Negative for numbness.  Hematological: Does not bruise/bleed easily.  Psychiatric/Behavioral: Positive for confusion.     ____________________________________________  PHYSICAL EXAM:      VITAL SIGNS: ED Triage Vitals  Enc Vitals Group     BP      Pulse      Resp      Temp      Temp src      SpO2      Weight      Height      Head  Circumference      Peak Flow      Pain Score      Pain Loc      Pain Edu?      Excl. in [place]?      Physical Exam Vitals signs and nursing note reviewed.  Constitutional:      General: He is not in acute distress.    Appearance: He is well-developed.  HENT:     Head: Normocephalic and atraumatic.  Eyes:     Conjunctiva/sclera: Conjunctivae normal.  Neck:     Musculoskeletal: Neck supple.  Cardiovascular:     Rate and Rhythm: Normal rate and regular rhythm.     Heart sounds: Normal heart sounds. No murmur. No friction rub.  Pulmonary:     Effort: Pulmonary effort is normal. No respiratory distress.     Breath sounds: Normal breath sounds. No wheezing or rales.  Abdominal:     General: There is no distension.     Palpations: Abdomen is soft.     Tenderness: There is no abdominal tenderness.  Skin:    General: Skin is warm.     Capillary Refill: Capillary refill takes less than 2 seconds.  Neurological:     Mental Status: He is alert and oriented to person, place, and time.     Motor: No abnormal muscle tone.     Comments: Speech slurred but not aphasic. Subtle L facial weakness noted. Tongue protrusion is midline. Uvula elevated midline but slight pooling of secretions noted. Strength 4+/5 LUE and LLE, 5/5 RUE and RLE. Sensation intact. Gait deferred.         ____________________________________________   LABS (all labs ordered are listed, but only abnormal results are displayed)  Labs Reviewed  CBC WITH DIFFERENTIAL/PLATELET - Abnormal; Notable for the following components:      Result Value   RBC 6.12 (*)    Hemoglobin 17.4 (*)    HCT 53.7 (*)    All other components within normal limits  COMPREHENSIVE METABOLIC PANEL - Abnormal; Notable for the following components:   Glucose, Bld 110 (*)    BUN 25 (*)    All other components within normal limits  SARS CORONAVIRUS 2 BY RT PCR (HOSPITAL ORDER, [facility])  PROTIME-INR  URINALYSIS,  COMPLETE (UACMP) WITH MICROSCOPIC  TROPONIN I (HIGH SENSITIVITY)  TROPONIN I (HIGH SENSITIVITY)    ____________________________________________  EKG: Normal sinus rhythm, VR 76. QRS 92, QTc 423. Subtle ST elevations in anterior precordial leads with deep ST depressions in inferolateral leads, worsened compared to prior. ________________________________________  RADIOLOGY All imaging, including plain films, CT scans, and ultrasounds, independently reviewed by me, and interpretations confirmed via  formal radiology reads.  ED MD interpretation:   CT Head: NAICA CXR: Clear  Official radiology report(s): Ct Head Wo Contrast  Result Date: 01/03/2019 CLINICAL DATA:  Increased weakness, recent stroke EXAM: CT HEAD WITHOUT CONTRAST TECHNIQUE: Contiguous axial images were obtained from the base of the skull through the vertex without intravenous contrast. COMPARISON:  01/02/2019 FINDINGS: Brain: Stable size and appearance of ovoid hyperattenuating 3.1 x 2.8 x 2.4 cm right parafalcine mass compatible with meningioma. There is mild mass effect and edema within the adjacent white matter. Low-density changes within the pons and right cerebellum compatible with known infarcts. There is no CT evidence of a new infarct. No intracranial hemorrhage. No hydrocephalus. Vascular: Mild atherosclerotic calcifications involving the large vessels of the skull base. Skull: Normal. Negative for fracture or focal lesion. Sinuses/Orbits: No acute finding. Other: None. IMPRESSION: 1. Evolving low-density changes within the pons and right cerebellum compatible with known acute/subacute infarcts. No evidence of hemorrhagic conversion. 2. Stable appearance of 3.1 cm right parafalcine meningioma with mild adjacent edema. Electronically Signed   By: Davina Poke M.D.   On: 01/04/2019 14:14   Dg Chest Portable 1 View  Result Date: 01/04/2019 CLINICAL DATA:  Shortness of breath EXAM: PORTABLE CHEST 1 VIEW COMPARISON:  None.  FINDINGS: Lungs are clear. Heart size and pulmonary vascularity are normal. No adenopathy. No bone lesions. There is an azygos lobe on the right, an anatomic variant. IMPRESSION: No edema or consolidation.  No evident adenopathy. Electronically Signed   By: Lowella Grip III M.D.   On: 01/03/2019 15:02    ____________________________________________  PROCEDURES   Procedure(s) performed (including Critical Care):  Procedures  ____________________________________________  INITIAL IMPRESSION / MDM / [place] / ED COURSE  As part of my medical decision making, I reviewed the following data within the electronic MEDICAL RECORD NUMBER Notes from prior ED visits and [place] Controlled Substance Database      *LUNDON SPORT was evaluated in Emergency Department on 12/18/2018 for the symptoms described in the history of present illness. He was evaluated in the context of the global COVID-19 pandemic, which necessitated consideration that the patient might be at risk for infection with the SARS-CoV-2 virus that causes COVID-19. Institutional protocols and algorithms that pertain to the evaluation of patients at risk for COVID-19 are in a state of rapid change based on information released by regulatory bodies including the CDC and federal and state organizations. These policies and algorithms were followed during the patient's care in the ED.  Some ED evaluations and interventions may be delayed as a result of limited staffing during the pandemic.*      Medical Decision Making:  58 yo M here with weakness, slurred speech in setting of recent ischemic CVA. EKG noted to have ST depressions w/ slight elevation in V1-V3, so activated as a CODE STEMI en route.   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 DDx includes hemorrhagic conversion, exacerbation of deficits in setting of UTI/occult infection/metabolic derangement. CT head shows NAICA. Labs overall reassuring. BP at  baseline. EKG does show ST depressions but trop neg, and case reviewed with Dr. Fletcher Anon - doubt STEMI, suspect this is either demand 2/2 underlying coronary disease versus reactive 2/2 CVA.   Case discussed with Dr. Doy Mince - will obtain stat MRI. If positive, may need ot be transferred for possible basilar artery stenting. Signed out to Dr. Quentin Cornwall.  ____________________________________________  FINAL CLINICAL IMPRESSION(S) / ED DIAGNOSES  Final diagnoses:  Stroke-like  symptoms  Abnormal EKG     MEDICATIONS GIVEN DURING THIS VISIT:  Medications  0.9 %  sodium chloride infusion (has no administration in time range)     ED Discharge Orders    None       Note:  This document was prepared using Dragon voice recognition software and may include unintentional dictation errors.   Duffy Bruce, MD 01/07/2019 [phone]

## 2019-01-08 NOTE — ED Notes (Signed)
Pt to ct 

## 2019-01-08 NOTE — ED Notes (Signed)
Pt returned from CT °

## 2019-01-08 NOTE — ED Notes (Addendum)
Pt resting calmly on stretcher with family at the bedside. Pt eyes open with no increased work of breathing or apparent distress noted at this time. Pt and family do not express any needs at this time. Provided for comfort and safety and will continue to assess.

## 2019-01-08 NOTE — ED Notes (Addendum)
This nurse repositioned pt with help of family member in room

## 2019-01-08 NOTE — Progress Notes (Signed)
   01/04/2019 1400  Clinical Encounter Type  Visited With Patient not available  Visit Type Initial;Code   Code STEMI page received for this patient. Upon arrival, the medical team was assessing the patient. Chaplain maintained pastoral presence and offered silent prayer outside the patient's room. No needs at the time.   Code STEMI cancelled.

## 2019-01-08 NOTE — ED Notes (Signed)
Pt to mri 

## 2019-01-08 NOTE — ED Triage Notes (Signed)
Pt from home with increased weakness today; stemi called in field. Pt had stroke last week and is having difficulty speaking and swallowing. Pt alert, speaks somewhat softly. Sensory intact face, arms, legs.

## 2019-01-08 NOTE — ED Notes (Signed)
Called for hospital bed for pt comfort due to delay in pt transfer. Family aware.

## 2019-01-08 NOTE — Consult Note (Signed)
Referring Physician: Ellender Hose    Chief Complaint: Difficulty swallowing  HPI: Paul Leonard is an 58 y.o. 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  Patient was placed on DUAP and statin at discharge.  Patient reports that since return to home he has worsened.  He is a poor historian and it is unclear exactly when he worsened.  His left sided weakness worsened to the point that he has not been ambulatory and as of last night he is unable to swallow.  Patient was brought in for further evaluation.    Date last known well: Unable to determine Time last known well: Unable to determine tPA Given: No: Recent infarct  Past Medical History:  Diagnosis Date  . Hypertension     History reviewed. No pertinent surgical history.  History reviewed. No pertinent family history. Social History:  reports that he has been smoking cigarettes. He has been smoking about 1.00 pack per day. He has never used smokeless tobacco. He reports current alcohol use of about 1.0 standard drinks of alcohol per week. He reports current drug use. Drug: Cocaine.  Allergies: No Known Allergies  Medications: I have reviewed the patient's current medications. Prior to Admission: (Not in a hospital admission)  Scheduled: . aspirin  300 mg Rectal Once    ROS: History obtained from the patient  General ROS: negative for - chills, fatigue, fever, night sweats, weight gain or weight loss Psychological ROS: negative for - behavioral disorder, hallucinations, memory difficulties, mood swings or suicidal ideation Ophthalmic ROS: negative for - blurry vision, double vision, eye pain or loss of vision ENT ROS: as noted in HPI Allergy and Immunology ROS: negative for - hives or itchy/watery eyes Hematological and Lymphatic ROS: negative for - bleeding problems, bruising or  swollen lymph nodes Endocrine ROS: negative for - galactorrhea, hair pattern changes, polydipsia/polyuria or temperature intolerance Respiratory ROS: negative for - cough, hemoptysis, shortness of breath or wheezing Cardiovascular ROS: negative for - chest pain, dyspnea on exertion, edema or irregular heartbeat Gastrointestinal ROS: negative for - abdominal pain, diarrhea, hematemesis, nausea/vomiting or stool incontinence Genito-Urinary ROS: negative for - dysuria, hematuria, incontinence or urinary frequency/urgency Musculoskeletal ROS: negative for - joint swelling or muscular weakness Neurological ROS: as noted in HPI Dermatological ROS: negative for rash and skin lesion changes  Physical Examination: Height 5\' 10"  (1.778 m), weight 83.9 kg.  HEENT-  Normocephalic, no lesions, without obvious abnormality.  Normal external eye and conjunctiva.  Normal TM's bilaterally.  Normal auditory canals and external ears. Normal external nose, mucus membranes and septum.  Normal pharynx. Cardiovascular- S1, S2 normal, pulses palpable throughout   Lungs- chest clear, no wheezing, rales, normal symmetric air entry Abdomen- soft, non-tender; bowel sounds normal; no masses,  no organomegaly Extremities- no edema Lymph-no adenopathy palpable Musculoskeletal-no joint tenderness, deformity or swelling Skin-warm and dry, no hyperpigmentation, vitiligo, or suspicious lesions  Neurological Examination   Mental Status: Alert, oriented, thought content appropriate.  Speech fluent but soft and dysarthric.  Able to follow commands without difficulty. Cranial Nerves: II: Discs flat bilaterally; Visual fields grossly normal, pupils equal, round, reactive to light and accommodation III,IV, VI: ptosis not present, extra-ocular motions intact bilaterally V,VII: left facial droop, facial light touch sensation normal bilaterally VIII: hearing normal bilaterally IX,X: gag reflex reduced XI: bilateral shoulder  shrug XII: midline tongue extension Motor: Gives full  strength in the right upper and lower extremity.  0/5 in the LUE and 1-2/5 in the LLE Sensory: Pinprick and light touch intact throughout, bilaterally Deep Tendon Reflexes: Symmetric throughout Plantars: Right: mute   Left: mute Cerebellar: Normal finger-to-nose and normal heel-to-shin testing on the right.  Unable to perform on the left due to weakness Gait: not tested due to safety concerns   Laboratory Studies:  Basic Metabolic Panel: Recent Labs  Lab 01/04/19 0711 12/22/2018 1353  NA 140 141  K 3.9 3.6  CL 102 99  CO2 27 30  GLUCOSE 153* 110*  BUN 18 25*  CREATININE 0.97 1.21  CALCIUM 8.8* 9.2    Liver Function Tests: Recent Labs  Lab 12/30/2018 1353  AST 16  ALT 21  ALKPHOS 60  BILITOT 1.2  PROT 7.0  ALBUMIN 3.8   No results for input(s): LIPASE, AMYLASE in the last 168 hours. No results for input(s): AMMONIA in the last 168 hours.  CBC: Recent Labs  Lab 01/01/2019 1353  WBC 7.3  NEUTROABS 4.6  HGB 17.4*  HCT 53.7*  MCV 87.7  PLT 353    Cardiac Enzymes: No results for input(s): CKTOTAL, CKMB, CKMBINDEX, TROPONINI in the last 168 hours.  BNP: Invalid input(s): POCBNP  CBG: No results for input(s): GLUCAP in the last 168 hours.  Microbiology: Results for orders placed or performed during the hospital encounter of 12/25/2018  SARS Coronavirus 2 by RT PCR (hospital order, performed in [facility] hospital lab) Nasopharyngeal Nasopharyngeal Swab     Status: None   Collection Time: 01/05/2019  1:51 PM   Specimen: Nasopharyngeal Swab  Result Value Ref Range Status   SARS Coronavirus 2 NEGATIVE NEGATIVE Final    Comment: (NOTE) If result is NEGATIVE SARS-CoV-2 target nucleic acids are NOT DETECTED. The SARS-CoV-2 RNA is generally detectable in upper and lower  respiratory specimens during the acute phase of infection. The lowest  concentration of SARS-CoV-2 viral copies this assay can detect is 250   copies / mL. A negative result does not preclude SARS-CoV-2 infection  and should not be used as the sole basis for treatment or other  patient management decisions.  A negative result may occur with  improper specimen collection / handling, submission of specimen other  than nasopharyngeal swab, presence of viral mutation(s) within the  areas targeted by this assay, and inadequate number of viral copies  (<250 copies / mL). A negative result must be combined with clinical  observations, patient history, and epidemiological information. If result is POSITIVE SARS-CoV-2 target nucleic acids are DETECTED. The SARS-CoV-2 RNA is generally detectable in upper and lower  respiratory specimens dur ing the acute phase of infection.  Positive  results are indicative of active infection with SARS-CoV-2.  Clinical  correlation with patient history and other diagnostic information is  necessary to determine patient infection status.  Positive results do  not rule out bacterial infection or co-infection with other viruses. If result is PRESUMPTIVE POSTIVE SARS-CoV-2 nucleic acids MAY BE PRESENT.   A presumptive positive result was obtained on the submitted specimen  and confirmed on repeat testing.  While 2019 novel coronavirus  (SARS-CoV-2) nucleic acids may be present in the submitted sample  additional confirmatory testing may be necessary for epidemiological  and / or clinical management purposes  to differentiate between  SARS-CoV-2 and other Sarbecovirus currently known to infect humans.  If clinically indicated additional testing with an alternate test  methodology [phone]) is advised. The SARS-CoV-2 RNA is  generally  detectable in upper and lower respiratory sp ecimens during the acute  phase of infection. The expected result is Negative. Fact Sheet for Patients:  StrictlyIdeas.no Fact Sheet for Healthcare  Providers: BankingDealers.co.za This test is not yet approved or cleared by the [place] FDA and has been authorized for detection and/or diagnosis of SARS-CoV-2 by FDA under an Emergency Use Authorization (EUA).  This EUA will remain in effect (meaning this test can be used) for the duration of the COVID-19 declaration under Section 564(b)(1) of the Act, 21 U.S.C. section 360bbb-3(b)(1), unless the authorization is terminated or revoked sooner. Performed at [facility], [address] [place], [place] [address]     Coagulation Studies: Recent Labs    12/19/2018 1353  LABPROT 14.2  INR 1.1    Urinalysis:  Recent Labs  Lab 01/07/2019 1728  COLORURINE YELLOW*  LABSPEC 1.026  PHURINE 5.0  GLUCOSEU NEGATIVE  HGBUR NEGATIVE  BILIRUBINUR NEGATIVE  KETONESUR NEGATIVE  PROTEINUR NEGATIVE  NITRITE NEGATIVE  LEUKOCYTESUR NEGATIVE    Lipid Panel:    Component Value Date/Time   CHOL 191 01/02/2019 0427   TRIG 35 01/02/2019 0427   HDL 51 01/02/2019 0427   CHOLHDL 3.7 01/02/2019 0427   VLDL 7 01/02/2019 0427   LDLCALC 133 (H) 01/02/2019 0427    HgbA1C:  Lab Results  Component Value Date   HGBA1C 5.5 01/02/2019    Urine Drug Screen:  No results found for: LABOPIA, COCAINSCRNUR, LABBENZ, AMPHETMU, THCU, LABBARB  Alcohol Level: No results for input(s): ETH in the last 168 hours.  Other results: EKG: sinus rhythm at 76 bpm with premature atrial complexes.    Imaging: Ct Head Wo Contrast  Result Date: 01/02/2019 CLINICAL DATA:  Increased weakness, recent stroke EXAM: CT HEAD WITHOUT CONTRAST TECHNIQUE: Contiguous axial images were obtained from the base of the skull through the vertex without intravenous contrast. COMPARISON:  01/02/2019 FINDINGS: Brain: Stable size and appearance of ovoid hyperattenuating 3.1 x 2.8 x 2.4 cm right parafalcine mass compatible with meningioma. There is mild mass effect and edema within the adjacent white  matter. Low-density changes within the pons and right cerebellum compatible with known infarcts. There is no CT evidence of a new infarct. No intracranial hemorrhage. No hydrocephalus. Vascular: Mild atherosclerotic calcifications involving the large vessels of the skull base. Skull: Normal. Negative for fracture or focal lesion. Sinuses/Orbits: No acute finding. Other: None. IMPRESSION: 1. Evolving low-density changes within the pons and right cerebellum compatible with known acute/subacute infarcts. No evidence of hemorrhagic conversion. 2. Stable appearance of 3.1 cm right parafalcine meningioma with mild adjacent edema. Electronically Signed   By: Davina Poke M.D.   On: 01/01/2019 14:14   Mr Brain Wo Contrast  Result Date: 12/30/2018 CLINICAL DATA:  Focal neuro deficit greater than 6 hours, stroke suspected. Recent stroke. Increased confusion, slurred speech, and drowsiness. EXAM: MRI HEAD WITHOUT CONTRAST TECHNIQUE: Multiplanar, multiecho pulse sequences of the brain and surrounding structures were obtained without intravenous contrast. COMPARISON:  Head CT 12/20/2018, CTA 01/02/2019, and MRI 01/01/2019 FINDINGS: Brain: There are right cerebellar and pontine infarcts as seen on the recent prior MRI, however there are multiple new acute infarcts in the right cerebellum, right greater than left/midline pons, midbrain/right cerebral peduncle, right occipital lobe, posterior mesial right temporal lobe, and right thalamus. No intracranial hemorrhage, midline shift, or extra-axial fluid collection is identified. A 3 cm para falcine mass in the right parietal region with mild surrounding edema is unchanged from the prior  MRI with mild local mass effect. Small foci of T2 hyperintensity in the cerebral white matter bilaterally are unchanged and nonspecific but compatible with mild chronic small vessel ischemic disease. A small chronic cortical infarct is again noted in the right frontal operculum. Vascular:  Abnormal appearance of the basilar artery with a near occlusive proximal stenosis shown on CTA. Abnormal appearance of the non dominant distal right vertebral artery as well. Mild generalized intracranial arterial dolichoectasia. Skull and upper cervical spine: Unremarkable bone marrow signal. Sinuses/Orbits: Unremarkable orbits. Paranasal sinuses and mastoid air cells are clear. Other: None. IMPRESSION: 1. Multiple new acute posterior circulation infarcts. 2. 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 3. Unchanged 3 cm right parietal parafalcine meningioma with mild edema. 4. Mild chronic small vessel ischemic disease. Electronically Signed   By: Logan Bores M.D.   On: 01/01/2019 18:01   Dg Chest Portable 1 View  Result Date: 01/07/2019 CLINICAL DATA:  Shortness of breath EXAM: PORTABLE CHEST 1 VIEW COMPARISON:  None. FINDINGS: Lungs are clear. Heart size and pulmonary vascularity are normal. No adenopathy. No bone lesions. There is an azygos lobe on the right, an anatomic variant. IMPRESSION: No edema or consolidation.  No evident adenopathy. Electronically Signed   By: Lowella Grip III M.D.   On: 12/25/2018 15:02    Assessment: 58 y.o. male with a history of HTN, tobacco abuse and recent infarcts who presents with worsening neurolgical status.  Patient with increased left sided weakness and difficulty swallowing.  MRI of the brain reviewed and shows multiple new posterior circulation infarcts.  Proximal basilar remains severely stenosed.  Patient on DUAP and reports being compliant.  Despite this continues to have embolic events, likely from the stenosed basilar.  Case discussed with neurohospitalist at [facility] and patient to be transferred there for possible further treatment.   Recent work up includes echocardiogram showing no cardiac source of emboli with EF of 60-65%.  A1c 5.5.  LDL 133.    Stroke Risk Factors - hyperlipidemia, hypertension and smoking  Plan: 1. Telemetry monitoring 2. Frequent neuro checks 3. ASA 300 mg rectally, daily 4. Patient to be transferred to [facility] 5. Liberal BP management.  Would not treat SBP<200.  Case discussed with Dr. Chanetta Marshall, MD Neurology [phone] 12/25/2018, 6:48 PM

## 2019-01-08 NOTE — Consult Note (Signed)
Cardiology Consultation:   Patient ID: Paul GUILBEAU MRN: [id number]; DOB: November 11, 1960  Admit date: 12/28/2018 Date of Consult: 12/28/2018  Primary Care Provider: Patient, No Pcp Per Primary Cardiologist: No primary care provider on file. new Primary Electrophysiologist:  None    Patient Profile:   Paul Leonard is a 58 y.o. male with a hx of essential hypertension and recent stroke who is being seen today for the evaluation of abnormal EKG at the request of Dr. Bland Span.Marland Kitchen  History of Present Illness:   Mr. Paul Leonard is a 58 year old African-American male with known history of essential hypertension and recent stroke.  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  The patient was seen by neurology and neurosurgery during the admission.  He underwent an echocardiogram which showed normal LV systolic function.  There was significant LVH which was more prominent in the apex suggestive of possible variant form of hypertrophic cardiomyopathy.  Patient has known abnormal baseline EKG. He was brought by EMS today due to mental status changes and generalized weakness.  The patient complained to me of difficulty swallowing and drooling.  There was some mention of shortness of breath but the patient denies that and denies any chest pain.  An EKG was done in the field and showed sinus rhythm with LVH with significant repolarization abnormalities.  However there was new ST depression in the inferior leads different from recent EKG.  This did not meet STEMI criteria but given the concerns about the anterior ST changes, a code STEMI was activated from the field.  Heart Pathway Score:     Past Medical History:  Diagnosis Date  . Hypertension     History reviewed. No pertinent surgical history.   Home Medications:  Prior  to Admission medications   Medication Sig Start Date End Date Taking? Authorizing Provider  amLODipine (NORVASC) 5 MG tablet Take 1 tablet (5 mg total) by mouth daily. 01/05/19   Mayo, Pete Pelt, MD  aspirin 81 MG chewable tablet Chew 1 tablet (81 mg total) by mouth daily. 01/05/19   Mayo, Pete Pelt, MD  atorvastatin (LIPITOR) 40 MG tablet Take 1 tablet (40 mg total) by mouth daily at 6 PM. 01/04/19   Mayo, Pete Pelt, MD  clopidogrel (PLAVIX) 75 MG tablet Take 1 tablet (75 mg total) by mouth daily. 01/05/19   Mayo, Pete Pelt, MD  hydrochlorothiazide (HYDRODIURIL) 25 MG tablet Take 1 tablet (25 mg total) by mouth daily. 01/05/19   Mayo, Pete Pelt, MD  lisinopril (ZESTRIL) 20 MG tablet Take 1 tablet (20 mg total) by mouth daily. 01/05/19   Mayo, Pete Pelt, MD  metoprolol tartrate (LOPRESSOR) 25 MG tablet Take 1 tablet (25 mg total) by mouth 2 (two) times daily. 01/04/19   Mayo, Pete Pelt, MD  senna-docusate (SENOKOT-S) 8.6-50 MG tablet Take 1 tablet by mouth 2 (two) times daily. 01/04/19   Mayo, Pete Pelt, MD    Inpatient Medications: Scheduled Meds:  Continuous Infusions:  PRN Meds:   Allergies:   No Known Allergies  Social History:   Social History   Socioeconomic History  . Marital status: Single    Spouse name: Not on file  . Number of children: Not on file  . Years of education: Not on file  . Highest education level: Not on file  Occupational History  .  Not on file  Social Needs  . Financial resource strain: Not on file  . Food insecurity    Worry: Not on file    Inability: Not on file  . Transportation needs    Medical: Not on file    Non-medical: Not on file  Tobacco Use  . Smoking status: Current Every Day Smoker    Packs/day: 1.00    Types: Cigarettes  . Smokeless tobacco: Never Used  Substance and Sexual Activity  . Alcohol use: Yes    Alcohol/week: 1.0 standard drinks    Types: 1 Cans of beer per week  . Drug use: Yes    Types: Cocaine    Comment: Last  used 10/16  . Sexual activity: Not on file  Lifestyle  . Physical activity    Days per week: Not on file    Minutes per session: Not on file  . Stress: Not on file  Relationships  . Social Herbalist on phone: Not on file    Gets together: Not on file    Attends religious service: Not on file    Active member of club or organization: Not on file    Attends meetings of clubs or organizations: Not on file    Relationship status: Not on file  . Intimate partner violence    Fear of current or ex partner: Not on file    Emotionally abused: Not on file    Physically abused: Not on file    Forced sexual activity: Not on file  Other Topics Concern  . Not on file  Social History Narrative  . Not on file    Family History:   History reviewed. No pertinent family history.   ROS:  Please see the history of present illness.   All other ROS reviewed and negative.     Physical Exam/Data:   Vitals:   12/18/2018 1353  Weight: 83.9 kg  Height: 5\' 10"  (1.778 m)   No intake or output data in the 24 hours ending 12/19/2018 1424 Last 3 Weights 01/01/2019 01/01/2019 12/22/2014  Weight (lbs) 184 lb 15.5 oz 185 lb 195 lb  Weight (kg) 83.9 kg 83.915 kg 88.451 kg     Body mass index is 26.54 kg/m.  General: The patient is in no acute distress but he seems to be having difficulty expressing himself with slurred speech. HEENT: normal Lymph: no adenopathy Neck: no JVD Endocrine:  No thryomegaly Vascular: No carotid bruits; FA pulses 2+ bilaterally without bruits  Cardiac:  normal S1, S2; RRR; no murmur  Lungs:  clear to auscultation bilaterally, no wheezing, rhonchi or rales  Abd: soft, nontender, no hepatomegaly  Ext: no edema Musculoskeletal:  No deformities, BUE and BLE strength normal and equal Skin: warm and dry    EKG:  The EKG was personally reviewed and demonstrates: Normal sinus rhythm with PACs, LVH with repolarization abnormalities.  Inferior and inferolateral ST  depression.  1 mm of ST elevation in aVR. Telemetry:  Telemetry was personally reviewed and demonstrates:    Relevant CV Studies: Echocardiogram done on January 02, 2019:  1. Left ventricular ejection fraction, by visual estimation, is 60 to 65%. The left ventricle has normal function. Normal left ventricular size. There is moderately increased left ventricular hypertrophy.  2. There is suggestion of more pronounced thickening at the apex, raising the possibility of apical variant hypertrophic cardiomyopathy.  3. Elevated mean left atrial pressure.  4. Left ventricular diastolic Doppler parameters are consistent with  pseudonormalization pattern of LV diastolic filling.  5. Global right ventricle has normal systolic function.The right ventricular size is normal. Mildly increased right ventricular wall thickness.  6. Left atrial size was elongated.  7. Right atrial size was normal.  8. Trivial pericardial effusion is present.  9. The mitral valve is normal in structure. Mild mitral valve regurgitation. 10. The tricuspid valve is grossly normal. Tricuspid valve regurgitation is trivial. 11. The aortic valve has an indeterminant number of cusps Aortic valve regurgitation was not visualized by color flow Doppler. Mild to moderate aortic valve sclerosis/calcification without any evidence of aortic stenosis. 12. The pulmonic valve was not well visualized. Pulmonic valve regurgitation is not visualized by color flow Doppler. 13. Normal pulmonary artery systolic pressure. 14. The inferior vena cava is dilated in size with >50% respiratory variability, suggesting right atrial pressure of 8 mmHg.   Laboratory Data:  High Sensitivity Troponin:  No results for input(s): TROPONINIHS in the last 720 hours.   Chemistry Recent Labs  Lab 01/04/19 0711  NA 140  K 3.9  CL 102  CO2 27  GLUCOSE 153*  BUN 18  CREATININE 0.97  CALCIUM 8.8*  GFRNONAA >60  GFRAA >60  ANIONGAP 11    No results for  input(s): PROT, ALBUMIN, AST, ALT, ALKPHOS, BILITOT in the last 168 hours. Hematology Recent Labs  Lab 01/12/2019 1353  WBC 7.3  RBC 6.12*  HGB 17.4*  HCT 53.7*  MCV 87.7  MCH 28.4  MCHC 32.4  RDW 12.9  PLT 353   BNPNo results for input(s): BNP, PROBNP in the last 168 hours.  DDimer No results for input(s): DDIMER in the last 168 hours.   Radiology/Studies:  Ct Head Wo Contrast  Result Date: 12/15/2018 CLINICAL DATA:  Increased weakness, recent stroke EXAM: CT HEAD WITHOUT CONTRAST TECHNIQUE: Contiguous axial images were obtained from the base of the skull through the vertex without intravenous contrast. COMPARISON:  01/02/2019 FINDINGS: Brain: Stable size and appearance of ovoid hyperattenuating 3.1 x 2.8 x 2.4 cm right parafalcine mass compatible with meningioma. There is mild mass effect and edema within the adjacent white matter. Low-density changes within the pons and right cerebellum compatible with known infarcts. There is no CT evidence of a new infarct. No intracranial hemorrhage. No hydrocephalus. Vascular: Mild atherosclerotic calcifications involving the large vessels of the skull base. Skull: Normal. Negative for fracture or focal lesion. Sinuses/Orbits: No acute finding. Other: None. IMPRESSION: 1. Evolving low-density changes within the pons and right cerebellum compatible with known acute/subacute infarcts. No evidence of hemorrhagic conversion. 2. Stable appearance of 3.1 cm right parafalcine meningioma with mild adjacent edema. Electronically Signed   By: Davina Poke M.D.   On: 01/11/2019 14:14    Assessment and Plan:   1. Abnormal EKG: The EKG is definitely concerning for possible underlying ischemia given significant ST depression in the inferior and inferolateral leads.  However, his presentation is not consistent with acute coronary syndrome.  In addition, he has baseline LVH and his EKG with significant repolarization abnormalities.  The EKG does not meet STEMI  criteria.  It is difficult to obtain an accurate history from the patient given his neurologic difficulties manifested by slurred speech and what seems to be aphasia.  I recommend hospitalization and serial cardiac enzymes.  Further ischemic cardiac evaluation to be determined based on his clinical progress and further diagnostic testing to determine etiology of current presentation.  2.  Generalized weakness and mental status change: The patient complains of difficulty  swallowing and drooling.  I wonder if he is not getting enough oral intake.  Check labs still evaluate for possible volume depletion and electrolyte abnormalities. I agree with repeat CT scan of the head.      For questions or updates, please contact [place] Please consult [email or web address] for contact info under     Signed, Kathlyn Sacramento, MD  01/02/2019 2:24 PM

## 2019-01-09 ENCOUNTER — Encounter: Payer: Self-pay | Admitting: Radiology

## 2019-01-09 ENCOUNTER — Inpatient Hospital Stay: Payer: Medicaid Other

## 2019-01-09 DIAGNOSIS — Z66 Do not resuscitate: Secondary | ICD-10-CM | POA: Diagnosis present

## 2019-01-09 DIAGNOSIS — I639 Cerebral infarction, unspecified: Secondary | ICD-10-CM

## 2019-01-09 DIAGNOSIS — Z8673 Personal history of transient ischemic attack (TIA), and cerebral infarction without residual deficits: Secondary | ICD-10-CM | POA: Diagnosis not present

## 2019-01-09 DIAGNOSIS — Z515 Encounter for palliative care: Secondary | ICD-10-CM | POA: Diagnosis present

## 2019-01-09 DIAGNOSIS — I422 Other hypertrophic cardiomyopathy: Secondary | ICD-10-CM | POA: Diagnosis present

## 2019-01-09 DIAGNOSIS — J9601 Acute respiratory failure with hypoxia: Secondary | ICD-10-CM | POA: Diagnosis present

## 2019-01-09 DIAGNOSIS — E785 Hyperlipidemia, unspecified: Secondary | ICD-10-CM | POA: Diagnosis present

## 2019-01-09 DIAGNOSIS — R1310 Dysphagia, unspecified: Secondary | ICD-10-CM | POA: Diagnosis present

## 2019-01-09 DIAGNOSIS — F17210 Nicotine dependence, cigarettes, uncomplicated: Secondary | ICD-10-CM | POA: Diagnosis present

## 2019-01-09 DIAGNOSIS — G8194 Hemiplegia, unspecified affecting left nondominant side: Secondary | ICD-10-CM | POA: Diagnosis present

## 2019-01-09 DIAGNOSIS — I10 Essential (primary) hypertension: Secondary | ICD-10-CM | POA: Diagnosis present

## 2019-01-09 DIAGNOSIS — E876 Hypokalemia: Secondary | ICD-10-CM | POA: Diagnosis present

## 2019-01-09 DIAGNOSIS — E860 Dehydration: Secondary | ICD-10-CM | POA: Diagnosis present

## 2019-01-09 DIAGNOSIS — Z7902 Long term (current) use of antithrombotics/antiplatelets: Secondary | ICD-10-CM | POA: Diagnosis not present

## 2019-01-09 DIAGNOSIS — R4701 Aphasia: Secondary | ICD-10-CM | POA: Diagnosis present

## 2019-01-09 DIAGNOSIS — D320 Benign neoplasm of cerebral meninges: Secondary | ICD-10-CM | POA: Diagnosis present

## 2019-01-09 DIAGNOSIS — K5900 Constipation, unspecified: Secondary | ICD-10-CM | POA: Diagnosis present

## 2019-01-09 DIAGNOSIS — G931 Anoxic brain damage, not elsewhere classified: Secondary | ICD-10-CM | POA: Diagnosis present

## 2019-01-09 DIAGNOSIS — J690 Pneumonitis due to inhalation of food and vomit: Secondary | ICD-10-CM | POA: Diagnosis present

## 2019-01-09 DIAGNOSIS — F419 Anxiety disorder, unspecified: Secondary | ICD-10-CM | POA: Diagnosis present

## 2019-01-09 DIAGNOSIS — I6312 Cerebral infarction due to embolism of basilar artery: Secondary | ICD-10-CM | POA: Diagnosis present

## 2019-01-09 DIAGNOSIS — R9431 Abnormal electrocardiogram [ECG] [EKG]: Secondary | ICD-10-CM | POA: Insufficient documentation

## 2019-01-09 DIAGNOSIS — J9602 Acute respiratory failure with hypercapnia: Secondary | ICD-10-CM | POA: Diagnosis present

## 2019-01-09 DIAGNOSIS — Z7982 Long term (current) use of aspirin: Secondary | ICD-10-CM | POA: Diagnosis not present

## 2019-01-09 DIAGNOSIS — I248 Other forms of acute ischemic heart disease: Secondary | ICD-10-CM | POA: Diagnosis present

## 2019-01-09 DIAGNOSIS — Z20828 Contact with and (suspected) exposure to other viral communicable diseases: Secondary | ICD-10-CM | POA: Diagnosis present

## 2019-01-09 LAB — COMPREHENSIVE METABOLIC PANEL
ALT: 17 U/L (ref 0–44)
AST: 16 U/L (ref 15–41)
Albumin: 3.3 g/dL — ABNORMAL LOW (ref 3.5–5.0)
Alkaline Phosphatase: 58 U/L (ref 38–126)
Anion gap: 11 (ref 5–15)
BUN: 28 mg/dL — ABNORMAL HIGH (ref 6–20)
CO2: 24 mmol/L (ref 22–32)
Calcium: 8.4 mg/dL — ABNORMAL LOW (ref 8.9–10.3)
Chloride: 105 mmol/L (ref 98–111)
Creatinine, Ser: 0.94 mg/dL (ref 0.61–1.24)
GFR calc Af Amer: >60 mL/min (ref >60)
GFR calc non Af Amer: >60 mL/min (ref >60)
Glucose, Bld: 92 mg/dL (ref 70–99)
Potassium: 3.3 mmol/L — ABNORMAL LOW (ref 3.5–5.1)
Sodium: 140 mmol/L (ref 135–145)
Total Bilirubin: 1.4 mg/dL — ABNORMAL HIGH (ref 0.3–1.2)
Total Protein: 6.3 g/dL — ABNORMAL LOW (ref 6.5–8.1)

## 2019-01-09 LAB — CBC
HCT: 53 % — ABNORMAL HIGH (ref 39.0–52.0)
Hemoglobin: 16.8 g/dL (ref 13.0–17.0)
MCH: 28.4 pg (ref 26.0–34.0)
MCHC: 31.7 g/dL (ref 30.0–36.0)
MCV: 89.5 fL (ref 80.0–100.0)
Platelets: 320 10*3/uL (ref 150–400)
RBC: 5.92 MIL/uL — ABNORMAL HIGH (ref 4.22–5.81)
RDW: 12.9 % (ref 11.5–15.5)
WBC: 7 10*3/uL (ref 4.0–10.5)
nRBC: 0 % (ref 0.0–0.2)

## 2019-01-09 LAB — BLOOD GAS, ARTERIAL
Acid-Base Excess: 1.5 mmol/L (ref 0.0–2.0)
Bicarbonate: 25.9 mmol/L (ref 20.0–28.0)
FIO2: 0.21
O2 Saturation: 93.3 %
Patient temperature: 37
pCO2 arterial: 39 mmHg (ref 32.0–48.0)
pH, Arterial: 7.43 (ref 7.350–7.450)
pO2, Arterial: 66 mmHg — ABNORMAL LOW (ref 83.0–108.0)

## 2019-01-09 LAB — CREATININE, SERUM
Creatinine, Ser: 0.96 mg/dL (ref 0.61–1.24)
GFR calc Af Amer: >60 mL/min (ref >60)
GFR calc non Af Amer: >60 mL/min (ref >60)

## 2019-01-09 LAB — LACTIC ACID, PLASMA: Lactic Acid, Venous: 1.5 mmol/L (ref 0.5–1.9)

## 2019-01-09 LAB — GLUCOSE, CAPILLARY: Glucose-Capillary: 93 mg/dL (ref 70–99)

## 2019-01-09 MED ORDER — ACETAMINOPHEN 650 MG RE SUPP
650.0000 mg | Freq: Once | RECTAL | Status: AC | PRN
  Administered 2019-01-09: 650 mg via RECTAL
  Filled 2019-01-09: qty 1

## 2019-01-09 MED ORDER — CHLORHEXIDINE GLUCONATE CLOTH 2 % EX PADS
6.0000 | MEDICATED_PAD | Freq: Every day | CUTANEOUS | Status: DC
  Administered 2019-01-09 – 2019-01-15 (×7): 6 via TOPICAL

## 2019-01-09 MED ORDER — VANCOMYCIN HCL 10 G IV SOLR
1750.0000 mg | Freq: Once | INTRAVENOUS | Status: AC
  Administered 2019-01-10: 1750 mg via INTRAVENOUS
  Filled 2019-01-09: qty 1750

## 2019-01-09 MED ORDER — ENOXAPARIN SODIUM 40 MG/0.4ML ~~LOC~~ SOLN
40.0000 mg | SUBCUTANEOUS | Status: DC
  Administered 2019-01-09 – 2019-01-10 (×2): 40 mg via SUBCUTANEOUS
  Filled 2019-01-09 (×3): qty 0.4

## 2019-01-09 MED ORDER — SODIUM CHLORIDE 0.9 % IV SOLN
3.0000 g | Freq: Four times a day (QID) | INTRAVENOUS | Status: DC
  Administered 2019-01-10 (×2): 3 g via INTRAVENOUS
  Filled 2019-01-09 (×3): qty 8
  Filled 2019-01-09 (×2): qty 3

## 2019-01-09 MED ORDER — ACETAMINOPHEN 325 MG PO TABS
650.0000 mg | ORAL_TABLET | ORAL | Status: DC | PRN
  Administered 2019-01-12 – 2019-01-15 (×2): 650 mg via ORAL
  Filled 2019-01-09 (×3): qty 2

## 2019-01-09 MED ORDER — SODIUM CHLORIDE 0.9 % IV SOLN
2.0000 g | Freq: Three times a day (TID) | INTRAVENOUS | Status: DC
  Filled 2019-01-09 (×3): qty 2

## 2019-01-09 MED ORDER — IOHEXOL 350 MG/ML SOLN
75.0000 mL | Freq: Once | INTRAVENOUS | Status: AC | PRN
  Administered 2019-01-09: 75 mL via INTRAVENOUS

## 2019-01-09 MED ORDER — ACETAMINOPHEN 160 MG/5ML PO SOLN
650.0000 mg | ORAL | Status: DC | PRN
  Filled 2019-01-09: qty 20.3

## 2019-01-09 MED ORDER — SENNOSIDES-DOCUSATE SODIUM 8.6-50 MG PO TABS: 1.0000 | ORAL_TABLET | Freq: Every evening | ORAL | Status: DC | PRN

## 2019-01-09 MED ORDER — VANCOMYCIN HCL IN DEXTROSE 1-5 GM/200ML-% IV SOLN
1000.0000 mg | Freq: Two times a day (BID) | INTRAVENOUS | Status: DC
  Filled 2019-01-09 (×2): qty 200

## 2019-01-09 MED ORDER — ASPIRIN 300 MG RE SUPP: 300.0000 mg | Freq: Every day | RECTAL | Status: DC

## 2019-01-09 MED ORDER — ACETAMINOPHEN 650 MG RE SUPP
650.0000 mg | RECTAL | Status: DC | PRN
  Administered 2019-01-09 (×2): 650 mg via RECTAL
  Filled 2019-01-09 (×2): qty 1

## 2019-01-09 MED ORDER — STROKE: EARLY STAGES OF RECOVERY BOOK
Freq: Once | Status: AC
  Administered 2019-01-09: 21:00:00

## 2019-01-09 MED ORDER — SODIUM CHLORIDE 0.9 % IV SOLN
INTRAVENOUS | Status: DC
  Administered 2019-01-09 (×2): via INTRAVENOUS

## 2019-01-09 NOTE — ED Provider Notes (Signed)
-----------------------------------------   10:53 AM on 01/09/2019 -----------------------------------------  I took over care on this patient from the overnight physician at signout this morning.  Per signout, the patient has evidence of new posterior circulation infarcts with left-sided weakness and aphasia as well as altered mental status.  He has been evaluated by neurology and is excepted for transfer to [facility] for possible intervention, however there are no available beds and so he has now been waiting for transfer.  On reassessment, the vital signs remain stable.  The patient continues to have aphasia, altered mental status, and left-sided weakness.  He has been reevaluated by Dr. Doy Mince from neurology.  I discussed his case with her.  She continues to recommend that the patient be transferred out of [facility] for possible intervention and/or other stroke treatment, given our limited capabilities at [facility].  From my understanding, other area hospitals are also at capacity and possibly on diversion so it appears unlikely that he can be transferred elsewhere.  I checked in with [facility], and there is still no bed availability at [facility] although there is a possibility of a bed later today.  I have contacted the transfer centers at [place] in order to verify whether there are any possible available beds and see if there is any possibility for transfer to the centers if [facility] remains full.  ----------------------------------------- 11:30 AM on 01/09/2019 -----------------------------------------  [facility] transfer center provider reports that after speaking to the neurologist on-call, [facility] has no capacity and is unable to accept the patient.  ----------------------------------------- 2:12 PM on 01/09/2019 -----------------------------------------  I discussed the case with the coordinator at the [facility] transfer center.  There is also no bed availability in the [facility] system at this time unless the  patient needed emergent intervention, which is not the current recommendation from neurology.  I contacted Dr. Doy Mince again.  She discussed the case with neurology at [facility] and the consensus now from the respective neurology teams is is that the patient is not a candidate for any acute intervention.  Therefore neurology recommends that we cancel the transfer and admit the patient here.  I had an extensive discussion with the patient's family members about the work-up so far and the plan of care.  I signed the patient out to the hospitalist Dr. Bridgett Larsson for admission.   Arta Silence, MD 01/09/19 1414

## 2019-01-09 NOTE — ED Notes (Addendum)
Pt temp taken orally and it was 99.1, then taken rectally and got 99.7. Pt repositioned to left side with pillows under bottom and shoulder, mouth suctioned, and pt changed. When asking if pt is experiencing any pain, pt shakes his head no.

## 2019-01-09 NOTE — H&P (Signed)
[facility] [place] NAME: Paul Leonard    MR#:  [id number]  DATE OF BIRTH:  05-11-1960  DATE OF ADMISSION:  01/04/2019  PRIMARY CARE PHYSICIAN: Patient, No Pcp Per   REQUESTING/REFERRING PHYSICIAN: Arta Silence, MD  CHIEF COMPLAINT:   Chief Complaint  Patient presents with   Code STEMI    HISTORY OF PRESENT ILLNESS:  Paul Leonard  is a 58 y.o. male with a known history of recent CVA and hypertension.  The patient presented to the ED with worsening altered mental status.  The patient has increased left-sided weakness and the difficulty swallowing.  MRI of the brain report multiple new infarcts.  Dr. Kathlen Brunswick discussed with [facility] neurologist and approved for transfer to [facility].  But the bed is not available so far.  Dr. Doy Mince discussed with vascular neurologist and it was determined that the patient could be served well in [facility] with palliative care and rehab placement.  ED physician discussed with Dr. Doy Mince and requested admission.  The patient was given aspirin 300 mg by rectum in ED. PAST MEDICAL HISTORY:   Past Medical History:  Diagnosis Date   Hypertension     PAST SURGICAL HISTORY:  History reviewed. No pertinent surgical history.  Unable to obtain.  SOCIAL HISTORY:   Social History   Tobacco Use   Smoking status: Current Every Day Smoker    Packs/day: 1.00    Types: Cigarettes   Smokeless tobacco: Never Used  Substance Use Topics   Alcohol use: Yes    Alcohol/week: 1.0 standard drinks    Types: 1 Cans of beer per week    FAMILY HISTORY:  History reviewed. No pertinent family history.  DRUG ALLERGIES:  No Known Allergies  REVIEW OF SYSTEMS:   Review of Systems  Unable to perform ROS: Mental status change    MEDICATIONS AT HOME:   Prior to Admission medications   Medication Sig Start Date End Date Taking? Authorizing Provider  acetaminophen (TYLENOL) 325 MG tablet Take 650 mg by mouth every 6  (six) hours as needed.   Yes [provider]  amLODipine (NORVASC) 5 MG tablet Take 1 tablet (5 mg total) by mouth daily. 01/05/19  Yes Mayo, Pete Pelt, MD  aspirin 81 MG chewable tablet Chew 1 tablet (81 mg total) by mouth daily. 01/05/19  Yes Mayo, Pete Pelt, MD  atorvastatin (LIPITOR) 40 MG tablet Take 1 tablet (40 mg total) by mouth daily at 6 PM. 01/04/19  Yes Mayo, Pete Pelt, MD  clopidogrel (PLAVIX) 75 MG tablet Take 1 tablet (75 mg total) by mouth daily. 01/05/19  Yes Mayo, Pete Pelt, MD  hydrochlorothiazide (HYDRODIURIL) 25 MG tablet Take 1 tablet (25 mg total) by mouth daily. 01/05/19  Yes Mayo, Pete Pelt, MD  lisinopril (ZESTRIL) 20 MG tablet Take 1 tablet (20 mg total) by mouth daily. 01/05/19  Yes Mayo, Pete Pelt, MD  metoprolol tartrate (LOPRESSOR) 25 MG tablet Take 1 tablet (25 mg total) by mouth 2 (two) times daily. 01/04/19  Yes Mayo, Pete Pelt, MD  senna-docusate (SENOKOT-S) 8.6-50 MG tablet Take 1 tablet by mouth 2 (two) times daily. 01/04/19  Yes Mayo, Pete Pelt, MD      VITAL SIGNS:  Blood pressure (!) 131/95, pulse 86, temperature 98.6 F (37 C), temperature source Rectal, resp. rate 17, height 5\' 10"  (1.778 m), weight 83.9 kg, SpO2 97 %.  PHYSICAL EXAMINATION:  Physical Exam  GENERAL:  58 y.o.-year-old patient lying in the  bed with no acute distress.  EYES: Pupils equal, round, reactive to light and accommodation. No scleral icterus. Extraocular muscles intact.  HEENT: Head atraumatic, normocephalic. NECK:  Supple, no jugular venous distention.   LUNGS: Normal breath sounds bilaterally, no wheezing, rales,rhonchi or crepitation. No use of accessory muscles of respiration.  CARDIOVASCULAR: S1, S2 normal. No murmurs, rubs, or gallops.  ABDOMEN: Soft, nontender, nondistended. Bowel sounds present. No organomegaly or mass.  EXTREMITIES: No pedal edema, cyanosis, or clubbing.  NEUROLOGIC: The patient is confused, did not follow commands. PSYCHIATRIC: The patient  is confused.  SKIN: No obvious rash, lesion, or ulcer.   LABORATORY PANEL:   CBC Recent Labs  Lab 12/20/2018 1353  WBC 7.3  HGB 17.4*  HCT 53.7*  PLT 353   ------------------------------------------------------------------------------------------------------------------  Chemistries  Recent Labs  Lab 01/03/2019 1353  NA 141  K 3.6  CL 99  CO2 30  GLUCOSE 110*  BUN 25*  CREATININE 1.21  CALCIUM 9.2  AST 16  ALT 21  ALKPHOS 60  BILITOT 1.2   ------------------------------------------------------------------------------------------------------------------  Cardiac Enzymes No results for input(s): TROPONINI in the last 168 hours. ------------------------------------------------------------------------------------------------------------------  RADIOLOGY:  Ct Head Wo Contrast  Result Date: 12/30/2018 CLINICAL DATA:  Increased weakness, recent stroke EXAM: CT HEAD WITHOUT CONTRAST TECHNIQUE: Contiguous axial images were obtained from the base of the skull through the vertex without intravenous contrast. COMPARISON:  01/02/2019 FINDINGS: Brain: Stable size and appearance of ovoid hyperattenuating 3.1 x 2.8 x 2.4 cm right parafalcine mass compatible with meningioma. There is mild mass effect and edema within the adjacent white matter. Low-density changes within the pons and right cerebellum compatible with known infarcts. There is no CT evidence of a new infarct. No intracranial hemorrhage. No hydrocephalus. Vascular: Mild atherosclerotic calcifications involving the large vessels of the skull base. Skull: Normal. Negative for fracture or focal lesion. Sinuses/Orbits: No acute finding. Other: None. IMPRESSION: 1. Evolving low-density changes within the pons and right cerebellum compatible with known acute/subacute infarcts. No evidence of hemorrhagic conversion. 2. Stable appearance of 3.1 cm right parafalcine meningioma with mild adjacent edema. Electronically Signed   By: Davina Poke M.D.   On: 12/20/2018 14:14   Mr Brain Wo Contrast  Result Date: 12/23/2018 CLINICAL DATA:  Focal neuro deficit greater than 6 hours, stroke suspected. Recent stroke. Increased confusion, slurred speech, and drowsiness. EXAM: MRI HEAD WITHOUT CONTRAST TECHNIQUE: Multiplanar, multiecho pulse sequences of the brain and surrounding structures were obtained without intravenous contrast. COMPARISON:  Head CT 12/14/2018, CTA 01/02/2019, and MRI 01/01/2019 FINDINGS: Brain: There are right cerebellar and pontine infarcts as seen on the recent prior MRI, however there are multiple new acute infarcts in the right cerebellum, right greater than left/midline pons, midbrain/right cerebral peduncle, right occipital lobe, posterior mesial right temporal lobe, and right thalamus. No intracranial hemorrhage, midline shift, or extra-axial fluid collection is identified. A 3 cm para falcine mass in the right parietal region with mild surrounding edema is unchanged from the prior MRI with mild local mass effect. Small foci of T2 hyperintensity in the cerebral white matter bilaterally are unchanged and nonspecific but compatible with mild chronic small vessel ischemic disease. A small chronic cortical infarct is again noted in the right frontal operculum. 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 Abnormal appearance of the non dominant distal right vertebral artery as well. Mild generalized intracranial arterial dolichoectasia. Skull and upper cervical spine: Unremarkable bone marrow signal. Sinuses/Orbits: Unremarkable orbits.  Paranasal sinuses and mastoid air cells are clear. Other: None. IMPRESSION: 1. Multiple new acute posterior circulation infarcts. 2. 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 3. Unchanged 3 cm right parietal parafalcine  meningioma with mild edema. 4. Mild chronic small vessel ischemic disease. Electronically Signed   By: Logan Bores M.D.   On: 12/22/2018 18:01   Dg Chest Portable 1 View  Result Date: 01/03/2019 CLINICAL DATA:  Shortness of breath EXAM: PORTABLE CHEST 1 VIEW COMPARISON:  None. FINDINGS: Lungs are clear. Heart size and pulmonary vascularity are normal. No adenopathy. No bone lesions. There is an azygos lobe on the right, an anatomic variant. IMPRESSION: No edema or consolidation.  No evident adenopathy. Electronically Signed   By: Lowella Grip III M.D.   On: 12/18/2018 15:02      IMPRESSION AND PLAN:   Acute/subacute CVA. The patient will be admitted to medical floor. Start CVA protocol, neurochecks, telemetry monitor, aspirin 300 mg rectal daily per Dr. Doy Mince.  Follow-up speech study.  PT and OT. Aspiration and fall precaution.  Dehydration.  IV fluid support. Hypertension.  Controlled.  All the records are reviewed and case discussed with ED provider. Management plans discussed with the patient, family and they are in agreement.  CODE STATUS: Full code for now.  TIME TAKING CARE OF THIS PATIENT: 52 minutes.    Demetrios Loll M.D on 01/09/2019 at 3:01 PM  Between 7am to 6pm - Pager - [phone]  After 6pm go to [email or web address] - Proofreader  [facility]  Office  [phone]  CC: Primary care physician; Patient, No Pcp Per   Note: This dictation was prepared with Dragon dictation along with smaller phrase technology. Any transcriptional errors that result from this process are unin

## 2019-01-09 NOTE — ED Notes (Signed)
Patient's sister gave security code: Paul Leonard

## 2019-01-09 NOTE — Consult Note (Addendum)
Pharmacy Antibiotic Note  Paul Leonard is a 58 y.o. male admitted on 12/21/2018 with pneumonia.  Pharmacy has been consulted for Vancomycin and Cefepime dosing.  Plan: 1) Vancomycin 1000 mg IV Q 12 hrs. Goal AUC 400-550. Expected AUC: 439.1 Expected Css: 12.1 SCr used: 0.96  MRSA PCR has been ordered, and if negative, will recommended discontinuation of medication.    2) Cefepime 2g IV Q8 hours   Height: 5\' 10"  (177.8 cm) Weight: 184 lb 15.5 oz (83.9 kg) IBW/kg (Calculated) : 73  Temp (24hrs), Avg:99.9 F (37.7 C), Min:98.6 F (37 C), Max:100.8 F (38.2 C)  Recent Labs  Lab 01/04/19 0711 12/21/2018 1353 01/09/19 2007  WBC  --  7.3  --   CREATININE 0.97 1.21 0.96    Estimated Creatinine Clearance: 85.5 mL/min (by C-G formula based on SCr of 0.96 mg/dL).    No Known Allergies  Antimicrobials this admission: Vancomycin 10/27 >>  Cefepime 10/27 >>     Thank you for allowing pharmacy to be a part of this patient's care.  Lance Coon A Casy Tavano 01/09/2019 10:55 PM

## 2019-01-09 NOTE — Progress Notes (Signed)
Progress Note  Patient Name: Paul Leonard Date of Encounter: 01/09/2019  Primary Cardiologist: Thurston Hole  Subjective   Patient minimally responsive.  Initially, plan was to transfer to [facility] for further management of recurrent strokes.  However, patient is now awaiting admission at [facility].  Inpatient Medications    Scheduled Meds: . [START ON 01/10/2019] aspirin  300 mg Rectal Daily  . enoxaparin (LOVENOX) injection  40 mg Subcutaneous Q24H   Continuous Infusions: . sodium chloride 100 mL/hr at 01/09/19 1754   PRN Meds: acetaminophen **OR** acetaminophen (TYLENOL) oral liquid 160 mg/5 mL **OR** acetaminophen   Vital Signs    Vitals:   01/09/19 1530 01/09/19 1629 01/09/19 1730 01/09/19 1800  BP: 130/86  (!) 148/89 (!) 153/94  Pulse: 84   87  Resp: 16  17 17   Temp:  100.1 F (37.8 C)    TempSrc:  Rectal    SpO2: 95%   96%  Weight:      Height:       No intake or output data in the 24 hours ending 01/09/19 1917 Last 3 Weights 12/21/2018 01/01/2019 12/22/2014  Weight (lbs) 184 lb 15.5 oz 185 lb 195 lb  Weight (kg) 83.9 kg 83.915 kg 88.451 kg      Telemetry    Sinus rhythm with PVCs - Personally Reviewed  ECG    No new tracing.  Physical Exam   GEN:  Lying in bed comfortably.  Patient briefly opens eyes to voice but does not respond to questions. Neck: No JVD Cardiac: RRR, no murmurs, rubs, or gallops.  Respiratory:  Coarse breath sounds anteriorly.  Poor inspiratory effort. GI: Soft, nontender, non-distended  MS: No edema; No deformity. Neuro:  Does not follow commands.  Briefly opens eyes to voice. Psych: Unable to assess due to altered mental status.  Labs    High Sensitivity Troponin:   Recent Labs  Lab 12/26/2018 1353 01/04/2019 1728  TROPONINIHS 8 10      Chemistry Recent Labs  Lab 01/04/19 0711 01/12/2019 1353  NA 140 141  K 3.9 3.6  CL 102 99  CO2 27 30  GLUCOSE 153* 110*  BUN 18 25*  CREATININE 0.97 1.21  CALCIUM 8.8* 9.2   PROT  --  7.0  ALBUMIN  --  3.8  AST  --  16  ALT  --  21  ALKPHOS  --  60  BILITOT  --  1.2  GFRNONAA >60 >60  GFRAA >60 >60  ANIONGAP 11 12     Hematology Recent Labs  Lab 12/27/2018 1353  WBC 7.3  RBC 6.12*  HGB 17.4*  HCT 53.7*  MCV 87.7  MCH 28.4  MCHC 32.4  RDW 12.9  PLT 353    BNPNo results for input(s): BNP, PROBNP in the last 168 hours.   DDimer No results for input(s): DDIMER in the last 168 hours.   Radiology    Ct Head Wo Contrast  Result Date: 01/01/2019 CLINICAL DATA:  Increased weakness, recent stroke EXAM: CT HEAD WITHOUT CONTRAST TECHNIQUE: Contiguous axial images were obtained from the base of the skull through the vertex without intravenous contrast. COMPARISON:  01/02/2019 FINDINGS: Brain: Stable size and appearance of ovoid hyperattenuating 3.1 x 2.8 x 2.4 cm right parafalcine mass compatible with meningioma. There is mild mass effect and edema within the adjacent white matter. Low-density changes within the pons and right cerebellum compatible with known infarcts. There is no CT evidence of a new infarct. No  intracranial hemorrhage. No hydrocephalus. Vascular: Mild atherosclerotic calcifications involving the large vessels of the skull base. Skull: Normal. Negative for fracture or focal lesion. Sinuses/Orbits: No acute finding. Other: None. IMPRESSION: 1. Evolving low-density changes within the pons and right cerebellum compatible with known acute/subacute infarcts. No evidence of hemorrhagic conversion. 2. Stable appearance of 3.1 cm right parafalcine meningioma with mild adjacent edema. Electronically Signed   By: Davina Poke M.D.   On: 12/19/2018 14:14   Mr Brain Wo Contrast  Result Date: 01/01/2019 CLINICAL DATA:  Focal neuro deficit greater than 6 hours, stroke suspected. Recent stroke. Increased confusion, slurred speech, and drowsiness. EXAM: MRI HEAD WITHOUT CONTRAST TECHNIQUE: Multiplanar, multiecho pulse sequences of the brain and  surrounding structures were obtained without intravenous contrast. COMPARISON:  Head CT 12/14/2018, CTA 01/02/2019, and MRI 01/01/2019 FINDINGS: Brain: There are right cerebellar and pontine infarcts as seen on the recent prior MRI, however there are multiple new acute infarcts in the right cerebellum, right greater than left/midline pons, midbrain/right cerebral peduncle, right occipital lobe, posterior mesial right temporal lobe, and right thalamus. No intracranial hemorrhage, midline shift, or extra-axial fluid collection is identified. A 3 cm para falcine mass in the right parietal region with mild surrounding edema is unchanged from the prior MRI with mild local mass effect. Small foci of T2 hyperintensity in the cerebral white matter bilaterally are unchanged and nonspecific but compatible with mild chronic small vessel ischemic disease. A small chronic cortical infarct is again noted in the right frontal operculum. 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 Abnormal appearance of the non dominant distal right vertebral artery as well. Mild generalized intracranial arterial dolichoectasia. Skull and upper cervical spine: Unremarkable bone marrow signal. Sinuses/Orbits: Unremarkable orbits. Paranasal sinuses and mastoid air cells are clear. Other: None. IMPRESSION: 1. Multiple new acute posterior circulation infarcts. 2. 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 3. Unchanged 3 cm right parietal parafalcine meningioma with mild edema. 4. Mild chronic small vessel ischemic disease. Electronically Signed   By: Logan Bores M.D.   On: 12/26/2018 18:01   Dg Chest Portable 1 View  Result Date: 12/14/2018 CLINICAL DATA:  Shortness of breath EXAM: PORTABLE CHEST 1 VIEW COMPARISON:  None. FINDINGS: Lungs are clear. Heart size and pulmonary  vascularity are normal. No adenopathy. No bone lesions. There is an azygos lobe on the right, an anatomic variant. IMPRESSION: No edema or consolidation.  No evident adenopathy. Electronically Signed   By: Lowella Grip III M.D.   On: 01/07/2019 15:02    Cardiac Studies   TTE (01/02/2019):  1. Left ventricular ejection fraction, by visual estimation, is 60 to 65%. The left ventricle has normal function. Normal left ventricular size. There is moderately increased left ventricular hypertrophy.  2. There is suggestion of more pronounced thickening at the apex, raising the possibility of apical variant hypertrophic cardiomyopathy.  3. Elevated mean left atrial pressure.  4. Left ventricular diastolic Doppler parameters are consistent with pseudonormalization pattern of LV diastolic filling.  5. Global right ventricle has normal systolic function.The right ventricular size is normal. Mildly increased right ventricular wall thickness.  6. Left atrial size was elongated.  7. Right atrial size was normal.  8. Trivial pericardial effusion is present.  9. The mitral valve is normal in structure. Mild mitral valve regurgitation. 10. The tricuspid valve is grossly normal.  Tricuspid valve regurgitation is trivial. 11. The aortic valve has an indeterminant number of cusps Aortic valve regurgitation was not visualized by color flow Doppler. Mild to moderate aortic valve sclerosis/calcification without any evidence of aortic stenosis. 12. The pulmonic valve was not well visualized. Pulmonic valve regurgitation is not visualized by color flow Doppler. 13. Normal pulmonary artery systolic pressure. 14. The inferior vena cava is dilated in size with >50% respiratory variability, suggesting right atrial pressure of 8 mmHg.  Patient Profile     58 y.o. male with history of hypertension and recurrent strokes, admitted with altered mental status and concern for STEMI.  Assessment & Plan    Abnormal EKG: EKG  personally reviewed, with ST/T changes most consistent with LVH/hypertrophic cardiomyopathy.  EKG does not meet STEMI criteria.  High-sensitivity troponin was negative x2.  No further ischemic evaluation is recommended at this time.  Given concern for apical variant hypertrophic cardiomyopathy and recurrent strokes, cardiac MRI should be considered as an outpatient.  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  However, apical hypertrophic cardiomyopathy is an independent risk factor for cardioembolic events.  Outpatient cardiac MRI may be useful to further evaluate for this and determine the need for long-term anticoagulation.  Telemetry monitoring should be continued as well.  For questions or updates, please contact [place] Please consult [email or web address] for contact info under [facility].     Signed, Nelva Bush, MD  01/09/2019, 7:17 PM

## 2019-01-09 NOTE — Progress Notes (Signed)
Pt transferred to ICU. Next of kin, per chart, Sister. Notified of Transfer to ICU and room number. All questions at time of call addressed.

## 2019-01-09 NOTE — ED Notes (Signed)
Patient changed, turned and cleaned, linens changed.

## 2019-01-09 NOTE — Progress Notes (Signed)
eLink Physician-Brief Progress Note Patient Name: Paul Leonard DOB: Dec 28, 1960 MRN: [id number]   Date of Service  01/09/2019  HPI/Events of Note  12 M history of hypertension, recurrently CVA more recently admitted for acute CVA with left sided weakness and speech difficulty. Initially admitted 10/26 for worsening neurologic status with MRI showing multiple posterior circulation infarcts, stenosed proximal basilar artery.  eICU Interventions  Continue DUAPT Permissive hypertension PT/OT, speech therapy     Intervention Category Evaluation Type: New Patient Evaluation  [name] 01/09/2019, 11:39 PM

## 2019-01-09 NOTE — Progress Notes (Signed)
Pt transferred from floor due to unresponsiveness.  CTA Head/Neck performed, which remains unchanged from prior CT, continues to show multiple subacute infarcts and large right para falcine meningioma.  Upon arrival to ICU pt will arouse to voice and follow simple commands with the RUE & RLE, aphasic, nods to questions (which seems to be similar to baseline upon admission).  Will hold off on intubation given that his GCS is currently 10.  Have called and updated pt's sister Michial Breheny of his transfer that he seems to be close to previous baseline, and that there is concern for aspiration, of which he is being placed on antibiotics. Freda Munro gave consent to intubate pt if he were to need it in the event he were to become unresponsive again.   Darel Hong, AGACNP-BC [facility] Pager: [phone]

## 2019-01-09 NOTE — ED Notes (Signed)
Discussed patient with his sister at this time. Apologized for long wait time for transfer.  Discussed sister's concerns with MD.  Will continue to monitor.

## 2019-01-09 NOTE — ED Notes (Signed)
Dr. Doy Mince in room to reassess patient.  Will continue to monitor.

## 2019-01-09 NOTE — ED Notes (Signed)
Patient turned, oral care performed.  Incontinent pad checked, no urinary output at this time.

## 2019-01-09 NOTE — Progress Notes (Signed)
SLP Cancellation Note  Patient Details Name: SERAPIO GELINAS MRN: [id number] DOB: 15-May-1960   Cancelled treatment:       Reason Eval/Treat Not Completed: Patient not medically ready;Medical issues which prohibited therapy(chart reviewed; consulted NSG and MD(neuro)) This SLP talked w/ Dr. Doy Mince (Neuro) who said pt will be admitted to this hospital, and not an immediate transfer instead. Due to pt's neurological presentation, difficulty swallowing and managing his own native saliva, and his increased risk for aspiration/choking, a BSE swallow evaluation would not be appropriate currently. An objective assessment (MBSS) will be ordered and planned for tomorrow. In the meantime, recommended to NSG to continue NPO status w/ frequent oral care as able during day/night to help cue him to manage own saliva and to stimulate his pharyngeal swallow --- this is crucial for a positive outcome on the MBSS. Noted MRI results - "Multiple new acute posterior circulation infarcts; Abnormal appearance of the basilar artery and distal right vertebral artery with a near occlusive stenosis of the proximal basilar artery on recent CTA.".    Orinda Kenner, MS, CCC-SLP Watson,Katherine 01/09/2019, 2:11 PM

## 2019-01-09 NOTE — Consult Note (Signed)
Pharmacy Antibiotic Note  Paul Leonard is a 58 y.o. male admitted on 12/27/2018 with pneumonia.  Pharmacy has been consulted for Vancomycin and Unasyn dosing.  Cefepime has been d/c'd  Plan: 1) Vancomycin 1000 mg IV Q 12 hrs. Goal AUC 400-550. Expected AUC: 439.1 Expected Css: 12.1 SCr used: 0.96  MRSA PCR has been ordered, and if negative, will recommended discontinuation of medication.    2) Unasyn 3gm IV Q6 hours   Height: 5\' 10"  (177.8 cm) Weight: 184 lb 15.5 oz (83.9 kg) IBW/kg (Calculated) : 73  Temp (24hrs), Avg:99.9 F (37.7 C), Min:98.6 F (37 C), Max:100.8 F (38.2 C)  Recent Labs  Lab 01/04/19 0711 01/05/2019 1353 01/09/19 2007 01/09/19 2239  WBC  --  7.3  --  7.0  CREATININE 0.97 1.21 0.96  --   LATICACIDVEN  --   --   --  1.5    Estimated Creatinine Clearance: 85.5 mL/min (by C-G formula based on SCr of 0.96 mg/dL).    No Known Allergies  Antimicrobials this admission: Vancomycin 10/27 >>  Unasyn 10/27 >> Cefepime d/c'd  Thank you for allowing pharmacy to be a part of this patient's care.  Hart Robinsons A 01/09/2019 11:14 PM

## 2019-01-09 NOTE — Progress Notes (Signed)
SLP Cancellation Note  Patient Details Name: Paul Leonard MRN: [id number] DOB: [birth year]   Cancelled treatment:       Reason Eval/Treat Not Completed: Patient not medically ready(chart reviewed; pt remains in the ED awaiting admit). Per chart notes, pt presents to the ED w/ worsening Neurological s/s; recent cerebellar infarcts, pontine infarct, and Severe basilar stenosis. Pt is alert, having difficulty managing his own saliva requiring oral suctioning per report. He is NPO.  Recommend remain NPO w/ need for objective swallow assessment(MBSS) to determine nature of any oropharyngeal phase dysphagia and risk for aspiration w/ oral intake - a bedside evaluation would not be warranted at this time d/t the severity of pt's presentation and concern for aspiration. Will f/u in the morning; discuss w/ MD. Recommend frequent oral care for hygiene and stimulation of swallowing.    Orinda Kenner, MS, CCC-SLP Watson,Katherine 01/09/2019, 8:17 AM

## 2019-01-09 NOTE — ED Notes (Addendum)
Pt resting, no signs of distress. This nurse and Seth Bake repositioned the pt and provided pillows under his left bottom and shoulders, and suctioned the pt. Rectal temperature was taken, and temp has decreased to 98.6. Pt diaphoretic and pillow case changed due to sweat, blood glucose accessed and received result of 93.

## 2019-01-09 NOTE — Progress Notes (Addendum)
Subjective: Patient will make no attempts at speech today but nods head.    Objective: Current vital signs: BP 124/89   Pulse 78   Temp 98.6 F (37 C) (Rectal)   Resp 14   Ht 5\' 10"  (1.778 m)   Wt 83.9 kg   SpO2 96%   BMI 26.54 kg/m  Vital signs in last 24 hours: Temp:  [98.6 F (37 C)-100 F (37.8 C)] 98.6 F (37 C) (10/27 0630) Pulse Rate:  [78-92] 78 (10/27 1100) Resp:  [14-21] 14 (10/27 1100) BP: (112-155)/(80-108) 124/89 (10/27 1100) SpO2:  [90 %-99 %] 96 % (10/27 1100) Weight:  [83.9 kg] 83.9 kg (10/26 1353)  Intake/Output from previous day: 10/26 0701 - 10/27 0700 In: 600 [I.V.:600] Out: -  Intake/Output this shift: No intake/output data recorded. Nutritional status:  Diet Order            Diet NPO time specified  Diet effective now              Neurologic Exam: Mental Status: Alert.  No speech.  Follows commands.  Cranial Nerves: II: Blinks to bilateral confrontation, pupils equal, round, reactive to light and accommodation III,IV, VI: patient will not keep eyes open.  Oculocephalic maneuvers intact V,VII: left facial droop, facial light touch sensation normal bilaterally VIII: hearing normal bilaterally IX,X: gag reflex reduced XI: bilateral shoulder shrug XII: midline tongue extension Motor: Right : Upper extremity   5/5    Left:     Upper extremity   0/5  Lower extremity   5/5     Lower extremity   0/5 Tone and bulk:normal tone throughout; no atrophy noted Sensory: Pinprick and light touch intact throughout, bilaterally   Lab Results: Basic Metabolic Panel: Recent Labs  Lab 01/04/19 0711 12/30/2018 1353  NA 140 141  K 3.9 3.6  CL 102 99  CO2 27 30  GLUCOSE 153* 110*  BUN 18 25*  CREATININE 0.97 1.21  CALCIUM 8.8* 9.2    Liver Function Tests: Recent Labs  Lab 12/31/2018 1353  AST 16  ALT 21  ALKPHOS 60  BILITOT 1.2  PROT 7.0  ALBUMIN 3.8   No results for input(s): LIPASE, AMYLASE in the last 168 hours. No results for  input(s): AMMONIA in the last 168 hours.  CBC: Recent Labs  Lab 01/13/2019 1353  WBC 7.3  NEUTROABS 4.6  HGB 17.4*  HCT 53.7*  MCV 87.7  PLT 353    Cardiac Enzymes: No results for input(s): CKTOTAL, CKMB, CKMBINDEX, TROPONINI in the last 168 hours.  Lipid Panel: No results for input(s): CHOL, TRIG, HDL, CHOLHDL, VLDL, LDLCALC in the last 168 hours.  CBG: Recent Labs  Lab 01/09/19 [id number]    Microbiology: Results for orders placed or performed during the hospital encounter of 12/21/2018  SARS Coronavirus 2 by RT PCR (hospital order, performed in [facility] hospital lab) Nasopharyngeal Nasopharyngeal Swab     Status: None   Collection Time: 01/11/2019  1:51 PM   Specimen: Nasopharyngeal Swab  Result Value Ref Range Status   SARS Coronavirus 2 NEGATIVE NEGATIVE Final    Comment: (NOTE) If result is NEGATIVE SARS-CoV-2 target nucleic acids are NOT DETECTED. The SARS-CoV-2 RNA is generally detectable in upper and lower  respiratory specimens during the acute phase of infection. The lowest  concentration of SARS-CoV-2 viral copies this assay can detect is 250  copies / mL. A negative result does not preclude SARS-CoV-2 infection  and should not be used  as the sole basis for treatment or other  patient management decisions.  A negative result may occur with  improper specimen collection / handling, submission of specimen other  than nasopharyngeal swab, presence of viral mutation(s) within the  areas targeted by this assay, and inadequate number of viral copies  (<250 copies / mL). A negative result must be combined with clinical  observations, patient history, and epidemiological information. If result is POSITIVE SARS-CoV-2 target nucleic acids are DETECTED. The SARS-CoV-2 RNA is generally detectable in upper and lower  respiratory specimens dur ing the acute phase of infection.  Positive  results are indicative of active infection with SARS-CoV-2.  Clinical   correlation with patient history and other diagnostic information is  necessary to determine patient infection status.  Positive results do  not rule out bacterial infection or co-infection with other viruses. If result is PRESUMPTIVE POSTIVE SARS-CoV-2 nucleic acids MAY BE PRESENT.   A presumptive positive result was obtained on the submitted specimen  and confirmed on repeat testing.  While 2019 novel coronavirus  (SARS-CoV-2) nucleic acids may be present in the submitted sample  additional confirmatory testing may be necessary for epidemiological  and / or clinical management purposes  to differentiate between  SARS-CoV-2 and other Sarbecovirus currently known to infect humans.  If clinically indicated additional testing with an alternate test  methodology [phone]) is advised. The SARS-CoV-2 RNA is generally  detectable in upper and lower respiratory sp ecimens during the acute  phase of infection. The expected result is Negative. Fact Sheet for Patients:  StrictlyIdeas.no Fact Sheet for Healthcare Providers: BankingDealers.co.za This test is not yet approved or cleared by the [place] FDA and has been authorized for detection and/or diagnosis of SARS-CoV-2 by FDA under an Emergency Use Authorization (EUA).  This EUA will remain in effect (meaning this test can be used) for the duration of the COVID-19 declaration under Section 564(b)(1) of the Act, 21 U.S.C. section 360bbb-3(b)(1), unless the authorization is terminated or revoked sooner. Performed at [facility], [address] [place], [place] [address]     Coagulation Studies: Recent Labs    12/25/2018 1353  LABPROT 14.2  INR 1.1    Imaging: Ct Head Wo Contrast  Result Date: 12/28/2018 CLINICAL DATA:  Increased weakness, recent stroke EXAM: CT HEAD WITHOUT CONTRAST TECHNIQUE: Contiguous axial images were obtained from the base of the skull through the  vertex without intravenous contrast. COMPARISON:  01/02/2019 FINDINGS: Brain: Stable size and appearance of ovoid hyperattenuating 3.1 x 2.8 x 2.4 cm right parafalcine mass compatible with meningioma. There is mild mass effect and edema within the adjacent white matter. Low-density changes within the pons and right cerebellum compatible with known infarcts. There is no CT evidence of a new infarct. No intracranial hemorrhage. No hydrocephalus. Vascular: Mild atherosclerotic calcifications involving the large vessels of the skull base. Skull: Normal. Negative for fracture or focal lesion. Sinuses/Orbits: No acute finding. Other: None. IMPRESSION: 1. Evolving low-density changes within the pons and right cerebellum compatible with known acute/subacute infarcts. No evidence of hemorrhagic conversion. 2. Stable appearance of 3.1 cm right parafalcine meningioma with mild adjacent edema. Electronically Signed   By: Davina Poke M.D.   On: 01/05/2019 14:14   Mr Brain Wo Contrast  Result Date: 12/18/2018 CLINICAL DATA:  Focal neuro deficit greater than 6 hours, stroke suspected. Recent stroke. Increased confusion, slurred speech, and drowsiness. EXAM: MRI HEAD WITHOUT CONTRAST TECHNIQUE: Multiplanar, multiecho pulse sequences of the brain and surrounding structures  were obtained without intravenous contrast. COMPARISON:  Head CT 12/14/2018, CTA 01/02/2019, and MRI 01/01/2019 FINDINGS: Brain: There are right cerebellar and pontine infarcts as seen on the recent prior MRI, however there are multiple new acute infarcts in the right cerebellum, right greater than left/midline pons, midbrain/right cerebral peduncle, right occipital lobe, posterior mesial right temporal lobe, and right thalamus. No intracranial hemorrhage, midline shift, or extra-axial fluid collection is identified. A 3 cm para falcine mass in the right parietal region with mild surrounding edema is unchanged from the prior MRI with mild local mass  effect. Small foci of T2 hyperintensity in the cerebral white matter bilaterally are unchanged and nonspecific but compatible with mild chronic small vessel ischemic disease. A small chronic cortical infarct is again noted in the right frontal operculum. 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 Abnormal appearance of the non dominant distal right vertebral artery as well. Mild generalized intracranial arterial dolichoectasia. Skull and upper cervical spine: Unremarkable bone marrow signal. Sinuses/Orbits: Unremarkable orbits. Paranasal sinuses and mastoid air cells are clear. Other: None. IMPRESSION: 1. Multiple new acute posterior circulation infarcts. 2. 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 3. Unchanged 3 cm right parietal parafalcine meningioma with mild edema. 4. Mild chronic small vessel ischemic disease. Electronically Signed   By: Logan Bores M.D.   On: 01/02/2019 18:01   Dg Chest Portable 1 View  Result Date: 12/28/2018 CLINICAL DATA:  Shortness of breath EXAM: PORTABLE CHEST 1 VIEW COMPARISON:  None. FINDINGS: Lungs are clear. Heart size and pulmonary vascularity are normal. No adenopathy. No bone lesions. There is an azygos lobe on the right, an anatomic variant. IMPRESSION: No edema or consolidation.  No evident adenopathy. Electronically Signed   By: Lowella Grip III M.D.   On: 01/07/2019 15:02    Medications: I have reviewed the patient's current medications.  Assessment/Plan: 58 y.o. male with a history of HTN, tobacco abuse and recent infarcts who presents with worsening neurolgical status.  Patient with increased left sided weakness and difficulty swallowing.  MRI of the brain reviewed and shows multiple new posterior circulation infarcts.  Proximal basilar remains severely stenosed.  Patient on DUAP and  reports being compliant.  Despite this continues to have embolic events, likely from the stenosed basilar.  Case discussed with neurohospitalist at [facility] on yesterday and approved or transfer but as of today no beds are available and are not available at other surrounding locations as well.  Case discussed today with the vascular neurologist who also reviewed case and films.  It was determined that patient could be served well here with PEG and rehab placement. Based on patient's progress will be evaluated for possible basilar intervention.     Recommendations: 1. Patient to be admitted and have PT/OT and speech evaluations 2. Continue ASA 300mg  rectally 3. Frequent neuro checks 4. Telemetry  Case discussed with Dr. Cherylann Banas    LOS: 0 days   Alexis Goodell, MD Neurology [phone] 01/09/2019  1:02 PM

## 2019-01-09 NOTE — ED Notes (Signed)
MD in room to give update to patient's family.  Will continue to monitor.

## 2019-01-10 ENCOUNTER — Inpatient Hospital Stay: Payer: Medicaid Other

## 2019-01-10 ENCOUNTER — Other Ambulatory Visit: Payer: Self-pay

## 2019-01-10 DIAGNOSIS — Z7189 Other specified counseling: Secondary | ICD-10-CM

## 2019-01-10 DIAGNOSIS — Z431 Encounter for attention to gastrostomy: Secondary | ICD-10-CM

## 2019-01-10 DIAGNOSIS — J9601 Acute respiratory failure with hypoxia: Secondary | ICD-10-CM

## 2019-01-10 DIAGNOSIS — I517 Cardiomegaly: Secondary | ICD-10-CM

## 2019-01-10 LAB — BLOOD GAS, ARTERIAL
Acid-Base Excess: 0 mmol/L (ref 0.0–2.0)
Bicarbonate: 25.4 mmol/L (ref 20.0–28.0)
FIO2: 0.35
MECHVT: 520 mL
Mechanical Rate: 10
O2 Saturation: 93.9 %
PEEP: 5 cmH2O
Patient temperature: 37
RATE: 10 resp/min
pCO2 arterial: 43 mmHg (ref 32.0–48.0)
pH, Arterial: 7.38 (ref 7.350–7.450)
pO2, Arterial: 72 mmHg — ABNORMAL LOW (ref 83.0–108.0)

## 2019-01-10 LAB — GLUCOSE, CAPILLARY
Glucose-Capillary: 81 mg/dL (ref 70–99)
Glucose-Capillary: 99 mg/dL (ref 70–99)
Glucose-Capillary: 81 mg/dL (ref 70–99)
Glucose-Capillary: 68 mg/dL — ABNORMAL LOW (ref 70–99)
Glucose-Capillary: 120 mg/dL — ABNORMAL HIGH (ref 70–99)
Glucose-Capillary: 137 mg/dL — ABNORMAL HIGH (ref 70–99)
Glucose-Capillary: 104 mg/dL — ABNORMAL HIGH (ref 70–99)

## 2019-01-10 LAB — LIPID PANEL
Cholesterol: 116 mg/dL (ref 0–200)
HDL: 28 mg/dL — ABNORMAL LOW (ref >40)
LDL Cholesterol: 74 mg/dL (ref 0–99)
Total CHOL/HDL Ratio: 4.1 RATIO
Triglycerides: 71 mg/dL (ref <150)
VLDL: 14 mg/dL (ref 0–40)

## 2019-01-10 LAB — HEMOGLOBIN A1C
Hgb A1c MFr Bld: 5.6 % (ref 4.8–5.6)
Mean Plasma Glucose: 114.02 mg/dL

## 2019-01-10 LAB — BASIC METABOLIC PANEL
Anion gap: 12 (ref 5–15)
BUN: 25 mg/dL — ABNORMAL HIGH (ref 6–20)
CO2: 23 mmol/L (ref 22–32)
Calcium: 8.2 mg/dL — ABNORMAL LOW (ref 8.9–10.3)
Chloride: 107 mmol/L (ref 98–111)
Creatinine, Ser: 0.86 mg/dL (ref 0.61–1.24)
GFR calc Af Amer: >60 mL/min (ref >60)
GFR calc non Af Amer: >60 mL/min (ref >60)
Glucose, Bld: 95 mg/dL (ref 70–99)
Potassium: 3.4 mmol/L — ABNORMAL LOW (ref 3.5–5.1)
Sodium: 142 mmol/L (ref 135–145)

## 2019-01-10 LAB — TROPONIN I (HIGH SENSITIVITY)
Troponin I (High Sensitivity): 33 ng/L — ABNORMAL HIGH (ref <18)
Troponin I (High Sensitivity): 41 ng/L — ABNORMAL HIGH (ref <18)

## 2019-01-10 LAB — TRIGLYCERIDES: Triglycerides: 74 mg/dL (ref <150)

## 2019-01-10 LAB — MRSA PCR SCREENING: MRSA by PCR: NEGATIVE

## 2019-01-10 LAB — PROCALCITONIN
Procalcitonin: <0.1 ng/mL
Procalcitonin: <0.1 ng/mL

## 2019-01-10 MED ORDER — ROCURONIUM BROMIDE 50 MG/5ML IV SOLN
INTRAVENOUS | Status: AC
  Administered 2019-01-10: 50 mg via INTRAVENOUS
  Filled 2019-01-10: qty 1

## 2019-01-10 MED ORDER — FENTANYL CITRATE (PF) 100 MCG/2ML IJ SOLN
25.0000 ug | INTRAMUSCULAR | Status: AC | PRN
  Administered 2019-01-11 (×3): 25 ug via INTRAVENOUS
  Filled 2019-01-10 (×3): qty 2

## 2019-01-10 MED ORDER — DEXTROSE 50 % IV SOLN
1.0000 | Freq: Once | INTRAVENOUS | Status: AC
  Administered 2019-01-10: 50 mL via INTRAVENOUS

## 2019-01-10 MED ORDER — ORAL CARE MOUTH RINSE: 15.0000 mL | Freq: Two times a day (BID) | OROMUCOSAL | Status: DC

## 2019-01-10 MED ORDER — ASPIRIN 325 MG PO TABS
162.5000 mg | ORAL_TABLET | Freq: Every day | ORAL | Status: DC
  Administered 2019-01-11 – 2019-01-13 (×3): 162.5 mg
  Filled 2019-01-10 (×3): qty 1

## 2019-01-10 MED ORDER — FENTANYL CITRATE (PF) 100 MCG/2ML IJ SOLN
INTRAMUSCULAR | Status: AC
  Administered 2019-01-10: 100 ug via INTRAVENOUS
  Filled 2019-01-10: qty 2

## 2019-01-10 MED ORDER — PANTOPRAZOLE SODIUM 40 MG IV SOLR
40.0000 mg | Freq: Every day | INTRAVENOUS | Status: DC
  Administered 2019-01-10 – 2019-01-11 (×2): 40 mg via INTRAVENOUS
  Filled 2019-01-10 (×2): qty 40

## 2019-01-10 MED ORDER — POLYETHYLENE GLYCOL 3350 17 G PO PACK
17.0000 g | PACK | Freq: Every day | ORAL | Status: DC
  Administered 2019-01-12 – 2019-01-13 (×2): 17 g via ORAL
  Filled 2019-01-10 (×2): qty 1

## 2019-01-10 MED ORDER — SODIUM CHLORIDE 0.9 % IV SOLN
INTRAVENOUS | Status: DC | PRN
  Administered 2019-01-10 (×2): 250 mL via INTRAVENOUS

## 2019-01-10 MED ORDER — MIDAZOLAM HCL 2 MG/2ML IJ SOLN: 2.0000 mg | INTRAMUSCULAR | Status: DC | PRN

## 2019-01-10 MED ORDER — FENTANYL CITRATE (PF) 100 MCG/2ML IJ SOLN
100.0000 ug | Freq: Once | INTRAMUSCULAR | Status: AC
  Administered 2019-01-10: 06:00:00 100 ug via INTRAVENOUS

## 2019-01-10 MED ORDER — ROCURONIUM BROMIDE 50 MG/5ML IV SOLN
50.0000 mg | Freq: Once | INTRAVENOUS | Status: AC
  Administered 2019-01-10: 06:00:00 50 mg via INTRAVENOUS

## 2019-01-10 MED ORDER — FENTANYL CITRATE (PF) 100 MCG/2ML IJ SOLN: 25.0000 ug | INTRAMUSCULAR | Status: DC | PRN

## 2019-01-10 MED ORDER — CLOPIDOGREL BISULFATE 75 MG PO TABS: 75.0000 mg | ORAL_TABLET | Freq: Every day | ORAL | Status: DC

## 2019-01-10 MED ORDER — ASPIRIN 325 MG PO TABS
325.0000 mg | ORAL_TABLET | Freq: Every day | ORAL | Status: DC
  Administered 2019-01-10: 11:00:00 325 mg
  Filled 2019-01-10: qty 1

## 2019-01-10 MED ORDER — ETOMIDATE 2 MG/ML IV SOLN
INTRAVENOUS | Status: AC
  Administered 2019-01-10: 20 mg via INTRAVENOUS
  Filled 2019-01-10: qty 10

## 2019-01-10 MED ORDER — POTASSIUM CHLORIDE 20 MEQ/15ML (10%) PO SOLN
40.0000 meq | Freq: Once | ORAL | Status: AC
  Administered 2019-01-10: 14:00:00 40 meq
  Filled 2019-01-10: qty 30

## 2019-01-10 MED ORDER — ETOMIDATE 2 MG/ML IV SOLN
20.0000 mg | Freq: Once | INTRAVENOUS | Status: AC
  Administered 2019-01-10: 06:00:00 20 mg via INTRAVENOUS

## 2019-01-10 MED ORDER — DEXTROSE 50 % IV SOLN
INTRAVENOUS | Status: AC
  Filled 2019-01-10: qty 50

## 2019-01-10 MED ORDER — PIPERACILLIN-TAZOBACTAM 3.375 G IVPB
3.3750 g | Freq: Three times a day (TID) | INTRAVENOUS | Status: DC
  Administered 2019-01-10 – 2019-01-11 (×3): 3.375 g via INTRAVENOUS
  Filled 2019-01-10 (×3): qty 50

## 2019-01-10 MED ORDER — IPRATROPIUM-ALBUTEROL 0.5-2.5 (3) MG/3ML IN SOLN: 3.0000 mL | RESPIRATORY_TRACT | Status: DC | PRN

## 2019-01-10 MED ORDER — LABETALOL HCL 5 MG/ML IV SOLN
10.0000 mg | INTRAVENOUS | Status: DC | PRN
  Administered 2019-01-10 – 2019-01-15 (×2): 10 mg via INTRAVENOUS
  Filled 2019-01-10 (×2): qty 4

## 2019-01-10 MED ORDER — PROPOFOL 1000 MG/100ML IV EMUL
5.0000 ug/kg/min | INTRAVENOUS | Status: DC
  Administered 2019-01-10: 10 ug/kg/min via INTRAVENOUS
  Administered 2019-01-10: 20.026 ug/kg/min via INTRAVENOUS
  Administered 2019-01-10: 08:00:00 10 ug/kg/min via INTRAVENOUS
  Administered 2019-01-11: 20 ug/kg/min via INTRAVENOUS
  Filled 2019-01-10 (×3): qty 100

## 2019-01-10 MED ORDER — POTASSIUM CHLORIDE 10 MEQ/100ML IV SOLN
10.0000 meq | INTRAVENOUS | Status: AC
  Administered 2019-01-10 (×3): 10 meq via INTRAVENOUS
  Filled 2019-01-10 (×3): qty 100

## 2019-01-10 MED ORDER — ORAL CARE MOUTH RINSE
15.0000 mL | OROMUCOSAL | Status: DC
  Administered 2019-01-10 – 2019-01-18 (×66): 15 mL via OROMUCOSAL

## 2019-01-10 MED ORDER — CHLORHEXIDINE GLUCONATE 0.12% ORAL RINSE (MEDLINE KIT)
15.0000 mL | Freq: Two times a day (BID) | OROMUCOSAL | Status: DC
  Administered 2019-01-10 – 2019-01-17 (×14): 15 mL via OROMUCOSAL

## 2019-01-10 NOTE — Progress Notes (Signed)
Portable CXR obtained, and ETT appeared to be at level of clavicles.  Tube was advanced by 2 cm to 25 cm at the lip.  Repeat CXR shows appropriate placement of tube.  Awaiting official read by radiology.   Darel Hong, AGACNP-BC [facility] Pager: [phone]

## 2019-01-10 NOTE — Progress Notes (Signed)
[facility] [place] NAME: Paul Leonard    MR#:  [id number]  DATE OF BIRTH:  March 13, 1961  SUBJECTIVE:   Patient was transferred to the ICU overnight due to unresponsiveness and was intubated early this morning due to inability to protect his airway.  REVIEW OF SYSTEMS:  ROS-unable to obtain, as patient is intubated and sedated  DRUG ALLERGIES:  No Known Allergies VITALS:  Blood pressure 125/80, pulse 85, temperature 98.5 F (36.9 C), temperature source Axillary, resp. rate 17, height 5\' 10"  (1.778 m), weight 77.4 kg, SpO2 99 %. PHYSICAL EXAMINATION:  Physical Exam  GENERAL:  Laying in the bed with no acute distress.  HEENT: Head atraumatic, normocephalic. Pupils equal, round, reactive to light and accommodation. No scleral icterus. Extraocular muscles intact. Oropharynx and nasopharynx clear. ETT in place. NECK:  Supple, no jugular venous distention. No thyroid enlargement. LUNGS: Lungs are clear to auscultation bilaterally. No wheezes, crackles, rhonchi. No use of accessory muscles of respiration.  CARDIOVASCULAR: RRR, S1, S2 normal. No murmurs, rubs, or gallops.  ABDOMEN: Soft, nontender, nondistended. Bowel sounds present.  EXTREMITIES: No pedal edema, cyanosis, or clubbing.  NEUROLOGIC: Unable to assess- intubated and sedated PSYCHIATRIC: Unable to assess SKIN: No obvious rash, lesion, or ulcer.  LABORATORY PANEL:  Male CBC Recent Labs  Lab 01/09/19 2239  WBC 7.0  HGB 16.8  HCT 53.0*  PLT 320   ------------------------------------------------------------------------------------------------------------------ Chemistries  Recent Labs  Lab 01/09/19 2239 01/10/19 0131  NA 140 142  K 3.3* 3.4*  CL 105 107  CO2 24 23  GLUCOSE 92 95  BUN 28* 25*  CREATININE 0.94 0.86  CALCIUM 8.4* 8.2*  AST 16  --   ALT 17  --   ALKPHOS 58  --   BILITOT 1.4*  --    RADIOLOGY:  Ct Angio Head W Or Wo Contrast  Result Date:  01/09/2019 CLINICAL DATA:  Left-sided weakness and cephalopathy. EXAM: CT ANGIOGRAPHY HEAD AND NECK TECHNIQUE: Multidetector CT imaging of the head and neck was performed using the standard protocol during bolus administration of intravenous contrast. Multiplanar CT image reconstructions and MIPs were obtained to evaluate the vascular anatomy. Carotid stenosis measurements (when applicable) are obtained utilizing NASCET criteria, using the distal internal carotid diameter as the denominator. CONTRAST:  79mL OMNIPAQUE IOHEXOL 350 MG/ML SOLN COMPARISON:  CTA head neck 01/02/2019 Brain MRI 12/28/2018 FINDINGS: CT HEAD FINDINGS Brain: Unchanged right parafalcine meningioma measuring 2.7 x 4.0 cm. Unchanged mild adjacent edema. There are scattered parenchymal calcifications in an unchanged pattern. There is hypoattenuation of the right pons consistent with known infarcts. No hemorrhage. Skull: The visualized skull base, calvarium and extracranial soft tissues are normal. Sinuses/Orbits: No fluid levels or advanced mucosal thickening of the visualized paranasal sinuses. No mastoid or middle ear effusion. The orbits are normal. CTA NECK FINDINGS SKELETON: There is no bony spinal canal stenosis. No lytic or blastic lesion. OTHER NECK: Normal pharynx, larynx and major salivary glands. No cervical lymphadenopathy. Unremarkable thyroid gland. UPPER CHEST: There are opacities in the right upper lobe likely indicating aspiration. Incidentally noted azygos fissure. AORTIC ARCH: There is mild calcific atherosclerosis of the aortic arch. There is no aneurysm, dissection or hemodynamically significant stenosis of the visualized portion of the aorta. Conventional 3 vessel aortic branching pattern. The visualized proximal subclavian arteries are widely patent. RIGHT CAROTID SYSTEM: There is calcification at the bifurcation and extending to the proximal ICA without hemodynamically significant stenosis. No dissection or other acute  abnormality. LEFT CAROTID SYSTEM: No dissection, occlusion or aneurysm. There is mixed density atherosclerosis extending into the proximal ICA, resulting in less than 50% stenosis. VERTEBRAL ARTERIES: Left dominant configuration. Both origins are clearly patent. There is no dissection, occlusion or flow-limiting stenosis to the skull base (V1-V3 segments). CTA HEAD FINDINGS POSTERIOR CIRCULATION: --Vertebral arteries: Normal V4 segments. --Posterior inferior cerebellar arteries (PICA): Patent origins from the vertebral arteries. --Anterior inferior cerebellar arteries (AICA): Patent origins from the basilar artery. 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 The distal portion remains opacified but diminutive. --Superior cerebellar arteries: Normal. --Posterior cerebral arteries: Normal. The left PCA is predominantly supplied by the posterior communicating artery. ANTERIOR CIRCULATION: --Intracranial internal carotid arteries: Atherosclerotic calcification of the internal carotid arteries at the skull base without hemodynamically significant stenosis. --Anterior cerebral arteries (ACA): Normal. Absent left A1 segment, normal variant --Middle cerebral arteries (MCA): Normal. VENOUS SINUSES: As permitted by contrast timing, patent. ANATOMIC VARIANTS: None Review of the MIP images confirms the above findings. IMPRESSION: 1. 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 The distal portion remains opacified but diminutive. 2. Right upper lobe opacities likely indicating aspiration. 3. Unchanged head CT with multiple subacute infarcts and large right parafalcine meningioma. 4. Bilateral carotid bifurcation atherosclerosis without hemodynamically significant stenosis according to NASCET criteria measurements. 5.  Aortic atherosclerosis (ICD10-I70.0). Electronically Signed   By: Ulyses Jarred M.D.   On: 01/09/2019 21:57   Dg Abd 1  View  Result Date: 01/10/2019 CLINICAL DATA:  OG tube placement EXAM: ABDOMEN - 1 VIEW COMPARISON:  None. FINDINGS: Enteric tube terminates in the mid gastric body. Nonobstructive bowel gas pattern. Visualized osseous structures are within normal limits. IMPRESSION: Enteric tube terminates in the mid gastric body. Electronically Signed   By: Julian Hy M.D.   On: 01/10/2019 10:13   Ct Head Wo Contrast  Result Date: 01/10/2019 CLINICAL DATA:  CVA EXAM: CT HEAD WITHOUT CONTRAST TECHNIQUE: Contiguous axial images were obtained from the base of the skull through the vertex without intravenous contrast. COMPARISON:  Brain MRI from 2 days ago. Head CT and CTA from yesterday FINDINGS: Brain: Unchanged patchy acute infarct in the right more than left pons, right midbrain, and right cerebellum. 3.5 cm presumed meningioma in the high right parafalcine region. Negative for hemorrhage or hydrocephalus. Vascular: Extensive atherosclerotic calcification. Skull: Negative Sinuses/Orbits: Negative IMPRESSION: 1. Unchanged acute posterior circulation infarcts without hemorrhage. 2. Known presumed meningioma in the high right parafalcine region. Electronically Signed   By: Monte Fantasia M.D.   On: 01/10/2019 08:56   Ct Angio Neck W Or Wo Contrast  Result Date: 01/09/2019 CLINICAL DATA:  Left-sided weakness and cephalopathy. EXAM: CT ANGIOGRAPHY HEAD AND NECK TECHNIQUE: Multidetector CT imaging of the head and neck was performed using the standard protocol during bolus administration of intravenous contrast. Multiplanar CT image reconstructions and MIPs were obtained to evaluate the vascular anatomy. Carotid stenosis measurements (when applicable) are obtained utilizing NASCET criteria, using the distal internal carotid diameter as the denominator. CONTRAST:  90mL OMNIPAQUE IOHEXOL 350 MG/ML SOLN COMPARISON:  CTA head neck 01/02/2019 Brain MRI 12/14/2018 FINDINGS: CT HEAD FINDINGS Brain: Unchanged right  parafalcine meningioma measuring 2.7 x 4.0 cm. Unchanged mild adjacent edema. There are scattered parenchymal calcifications in an unchanged pattern. There is hypoattenuation of the right pons consistent with known infarcts. No hemorrhage. Skull: The visualized skull base, calvarium and extracranial soft tissues are normal. Sinuses/Orbits: No fluid levels or advanced  mucosal thickening of the visualized paranasal sinuses. No mastoid or middle ear effusion. The orbits are normal. CTA NECK FINDINGS SKELETON: There is no bony spinal canal stenosis. No lytic or blastic lesion. OTHER NECK: Normal pharynx, larynx and major salivary glands. No cervical lymphadenopathy. Unremarkable thyroid gland. UPPER CHEST: There are opacities in the right upper lobe likely indicating aspiration. Incidentally noted azygos fissure. AORTIC ARCH: There is mild calcific atherosclerosis of the aortic arch. There is no aneurysm, dissection or hemodynamically significant stenosis of the visualized portion of the aorta. Conventional 3 vessel aortic branching pattern. The visualized proximal subclavian arteries are widely patent. RIGHT CAROTID SYSTEM: There is calcification at the bifurcation and extending to the proximal ICA without hemodynamically significant stenosis. No dissection or other acute abnormality. LEFT CAROTID SYSTEM: No dissection, occlusion or aneurysm. There is mixed density atherosclerosis extending into the proximal ICA, resulting in less than 50% stenosis. VERTEBRAL ARTERIES: Left dominant configuration. Both origins are clearly patent. There is no dissection, occlusion or flow-limiting stenosis to the skull base (V1-V3 segments). CTA HEAD FINDINGS POSTERIOR CIRCULATION: --Vertebral arteries: Normal V4 segments. --Posterior inferior cerebellar arteries (PICA): Patent origins from the vertebral arteries. --Anterior inferior cerebellar arteries (AICA): Patent origins from the basilar artery. --Basilar artery: Unchanged severe  stenosis of the basilar artery with short segment occlusion. The distal portion remains opacified but diminutive. --Superior cerebellar arteries: Normal. --Posterior cerebral arteries: Normal. The left PCA is predominantly supplied by the posterior communicating artery. ANTERIOR CIRCULATION: --Intracranial internal carotid arteries: Atherosclerotic calcification of the internal carotid arteries at the skull base without hemodynamically significant stenosis. --Anterior cerebral arteries (ACA): Normal. Absent left A1 segment, normal variant --Middle cerebral arteries (MCA): Normal. VENOUS SINUSES: As permitted by contrast timing, patent. ANATOMIC VARIANTS: None Review of the MIP images confirms the above findings. IMPRESSION: 1. 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 The distal portion remains opacified but diminutive. 2. Right upper lobe opacities likely indicating aspiration. 3. Unchanged head CT with multiple subacute infarcts and large right parafalcine meningioma. 4. Bilateral carotid bifurcation atherosclerosis without hemodynamically significant stenosis according to NASCET criteria measurements. 5.  Aortic atherosclerosis (ICD10-I70.0). Electronically Signed   By: Ulyses Jarred M.D.   On: 01/09/2019 21:57   Dg Chest Port 1 View  Result Date: 01/10/2019 CLINICAL DATA:  Shortness of breath. EXAM: PORTABLE CHEST 1 VIEW COMPARISON:  12/14/2018 FINDINGS: The cardiac silhouette, mediastinal and hilar contours are stable. There is tortuosity and ectasia of the thoracic aorta. The pulmonary hila appear normal. Stable mild eventration right hemidiaphragm. No definite infiltrates, edema or effusions. The bony thorax is intact. IMPRESSION: No acute cardiopulmonary findings. Electronically Signed   By: Marijo Sanes M.D.   On: 01/10/2019 06:46   Dg Chest Port 1 View  Result Date: 01/10/2019 CLINICAL DATA:  Endotracheal tube placement. EXAM: PORTABLE CHEST 1 VIEW  COMPARISON:  Earlier film, same date. FINDINGS: The cardiac silhouette, mediastinal and hilar contours are stable. Stable tortuosity and ectasia of the thoracic aorta. The NG tube is stable. New endotracheal tube in good position with its tip 4.2 cm above the carina. IMPRESSION: Endotracheal tube in good position, 4 cm above the carina. Electronically Signed   By: Marijo Sanes M.D.   On: 01/10/2019 06:45   Dg Chest Port 1 View  Result Date: 01/10/2019 CLINICAL DATA:  NG tube placement. EXAM: PORTABLE CHEST 1 VIEW COMPARISON:  Earlier film, same date. FINDINGS: The cardiac silhouette, mediastinal and hilar contours are stable. Stable tortuosity and ectasia  of the thoracic aorta. The lungs are clear. New NG tube is coursing down the esophagus and into the stomach. The upper abdominal bowel gas pattern is unremarkable. IMPRESSION: No acute cardiopulmonary findings. NG tube tip is in the fundal region of the stomach. Electronically Signed   By: Marijo Sanes M.D.   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 -Cardiology following- consider MRI as an outpatient to rule out HCM  Hypertension -Permissive hypertension for now -IV labetalol as needed  All the records are reviewed and case discussed with Care Management/Social Worker. Management plans discussed with the patient, family and they are in agreement.  CODE STATUS: Full Code  TOTAL TIME TAKING CARE OF THIS PATIENT: 40 minutes.   More than 50% of the time was spent in  counseling/coordination of care: YES  POSSIBLE D/C unknown, DEPENDING ON CLINICAL CONDITION.   Berna Spare Rylah Fukuda M.D on 01/10/2019 at 2:26 PM  Between 7am to 6pm - Pager [phone]  After 6pm go to [email or web address] - Proofreader  [facility]  Office  [phone]  CC: Primary care physician; Patient, No Pcp Per  Note: This dictation was prepared with Dragon dictation along with smaller phrase technology. Any transcriptional errors that result from this process are unintentional.

## 2019-01-10 NOTE — Progress Notes (Signed)
PT Cancellation Note  Patient Details Name: Paul Leonard MRN: [id number] DOB: 26-Sep-1960   Cancelled Treatment:    Reason Eval/Treat Not Completed: Medical issues which prohibited therapy(Consult received and chart reviewed. Patient now intubated/sedated due to decline in respiratory status and overall responsiveness.  Will complete initial order at this time; please re-consult as medically appropriate.)   Emile Ringgenberg H. Owens Shark, PT, DPT, NCS 01/10/19, 9:09 AM [phone]

## 2019-01-10 NOTE — Progress Notes (Signed)
Subjective: Patient transferred to the ICU overnight due to decreased level of responsiveness.  Required intubation for airway management and PNA.    Objective: Current vital signs: BP (!) 113/91 (BP Location: Right Arm)   Pulse 85   Temp 98.5 F (36.9 C) (Axillary)   Resp 16   Ht _0  (1.778 m)   Wt 77.4 kg   SpO2 99%   BMI 24.48 kg/m  Vital signs in last 24 hours: Temp:  [98.5 F (36.9 C)-102.2 F (39 C)] 98.5 F (36.9 C) (10/28 0725) Pulse Rate:  [77-122] 85 (10/28 1000) Resp:  [12-27] 16 (10/28 1200) BP: (113-184)/(77-121) 113/91 (10/28 1200) SpO2:  [95 %-100 %] 99 % (10/28 1200) FiO2 (%):  [35 %] 35 % (10/28 1200) Weight:  [77.4 kg] 77.4 kg (10/27 2305)  Intake/Output from previous day: 10/27 0701 - 10/28 0700 In: 2405.7 [I.V.:1890.4; IV Piggyback:515.3] Out: 700 [Urine:700] Intake/Output this shift: Total I/O In: 141.8 [I.V.:141.8] Out: -  Nutritional status:  Diet Order            Diet NPO time specified  Diet effective now              Neurologic Exam: Intubated and sedated  Lab Results: Basic Metabolic Panel: Recent Labs  Lab 01/04/19 0711 12/22/2018 1353 01/09/19 2007 01/09/19 2239 01/10/19 0131  NA 140 141  --  140 142  K 3.9 3.6  --  3.3* 3.4*  CL 102 99  --  105 107  CO2 27 30  --  24 23  GLUCOSE 153* 110*  --  92 95  BUN 18 25*  --  28* 25*  CREATININE 0.97 1.21 0.96 0.94 0.86  CALCIUM 8.8* 9.2  --  8.4* 8.2*    Liver Function Tests: Recent Labs  Lab 12/29/2018 1353 01/09/19 2239  AST 16 16  ALT 21 17  ALKPHOS 60 58  BILITOT 1.2 1.4*  PROT 7.0 6.3*  ALBUMIN 3.8 3.3*   No results for input(s): LIPASE, AMYLASE in the last 168 hours. No results for input(s): AMMONIA in the last 168 hours.  CBC: Recent Labs  Lab 12/14/2018 1353 01/09/19 2239  WBC 7.3 7.0  NEUTROABS 4.6  --   HGB 17.4* 16.8  HCT 53.7* 53.0*  MCV 87.7 89.5  PLT 353 320    Cardiac Enzymes: No results for input(s): CKTOTAL, CKMB, CKMBINDEX, TROPONINI in  the last 168 hours.  Lipid Panel: Recent Labs  Lab 01/10/19 0131 01/10/19 0814  CHOL 116  --   TRIG 71 74  HDL 28*  --   CHOLHDL 4.1  --   VLDL 14  --   LDLCALC 74  --     CBG: Recent Labs  Lab 01/09/19 0637 01/09/19 2310 01/10/19 0747 01/10/19 1215  GLUCAP 93 81 99 81    Microbiology: Results for orders placed or performed during the hospital encounter of 01/13/2019  SARS Coronavirus 2 by RT PCR (hospital order, performed in [facility] hospital lab) Nasopharyngeal Nasopharyngeal Swab     Status: None   Collection Time: 12/26/2018  1:51 PM   Specimen: Nasopharyngeal Swab  Result Value Ref Range Status   SARS Coronavirus 2 NEGATIVE NEGATIVE Final    Comment: (NOTE) If result is NEGATIVE SARS-CoV-2 target nucleic acids are NOT DETECTED. The SARS-CoV-2 RNA is generally detectable in upper and lower  respiratory specimens during the acute phase of infection. The lowest  concentration of SARS-CoV-2 viral copies this assay can detect is 250  copies /  mL. A negative result does not preclude SARS-CoV-2 infection  and should not be used as the sole basis for treatment or other  patient management decisions.  A negative result may occur with  improper specimen collection / handling, submission of specimen other  than nasopharyngeal swab, presence of viral mutation(s) within the  areas targeted by this assay, and inadequate number of viral copies  (<250 copies / mL). A negative result must be combined with clinical  observations, patient history, and epidemiological information. If result is POSITIVE SARS-CoV-2 target nucleic acids are DETECTED. The SARS-CoV-2 RNA is generally detectable in upper and lower  respiratory specimens dur ing the acute phase of infection.  Positive  results are indicative of active infection with SARS-CoV-2.  Clinical  correlation with patient history and other diagnostic information is  necessary to determine patient infection status.  Positive  results do  not rule out bacterial infection or co-infection with other viruses. If result is PRESUMPTIVE POSTIVE SARS-CoV-2 nucleic acids MAY BE PRESENT.   A presumptive positive result was obtained on the submitted specimen  and confirmed on repeat testing.  While 2019 novel coronavirus  (SARS-CoV-2) nucleic acids may be present in the submitted sample  additional confirmatory testing may be necessary for epidemiological  and / or clinical management purposes  to differentiate between  SARS-CoV-2 and other Sarbecovirus currently known to infect humans.  If clinically indicated additional testing with an alternate test  methodology [phone]) is advised. The SARS-CoV-2 RNA is generally  detectable in upper and lower respiratory sp ecimens during the acute  phase of infection. The expected result is Negative. Fact Sheet for Patients:  StrictlyIdeas.no Fact Sheet for Healthcare Providers: BankingDealers.co.za This test is not yet approved or cleared by the [place] FDA and has been authorized for detection and/or diagnosis of SARS-CoV-2 by FDA under an Emergency Use Authorization (EUA).  This EUA will remain in effect (meaning this test can be used) for the duration of the COVID-19 declaration under Section 564(b)(1) of the Act, 21 U.S.C. section 360bbb-3(b)(1), unless the authorization is terminated or revoked sooner. Performed at [facility], [address] [place], [place] [address]   Culture, blood (Routine X 2) w Reflex to ID Panel     Status: None (Preliminary result)   Collection Time: 01/09/19 10:38 PM   Specimen: BLOOD  Result Value Ref Range Status   Specimen Description BLOOD BLOOD LEFT HAND  Final   Special Requests   Final    BOTTLES DRAWN AEROBIC AND ANAEROBIC Blood Culture adequate volume   Culture   Final    NO GROWTH < 12 HOURS Performed at [facility], [address], [place], [place]  [address]    Report Status PENDING  Incomplete  Culture, blood (Routine X 2) w Reflex to ID Panel     Status: None (Preliminary result)   Collection Time: 01/09/19 10:39 PM   Specimen: BLOOD  Result Value Ref Range Status   Specimen Description BLOOD BLOOD LEFT WRIST  Final   Special Requests   Final    BOTTLES DRAWN AEROBIC AND ANAEROBIC Blood Culture adequate volume   Culture   Final    NO GROWTH < 12 HOURS Performed at [facility], [address], [place], [place] [address]    Report Status PENDING  Incomplete  MRSA PCR Screening     Status: None   Collection Time: 01/09/19 11:21 PM   Specimen: Nasal Mucosa; Nasopharyngeal  Result Value Ref Range Status  MRSA by PCR NEGATIVE NEGATIVE Final    Comment:        The GeneXpert MRSA Assay (FDA approved for NASAL specimens only), is one component of a comprehensive MRSA colonization surveillance program. It is not intended to diagnose MRSA infection nor to guide or monitor treatment for MRSA infections. Performed at [facility], [address] [place], [place] [address]     Coagulation Studies: Recent Labs    01/03/2019 1353  LABPROT 14.2  INR 1.1    Imaging: Ct Angio Head W Or Wo Contrast  Result Date: 01/09/2019 CLINICAL DATA:  Left-sided weakness and cephalopathy. EXAM: CT ANGIOGRAPHY HEAD AND NECK TECHNIQUE: Multidetector CT imaging of the head and neck was performed using the standard protocol during bolus administration of intravenous contrast. Multiplanar CT image reconstructions and MIPs were obtained to evaluate the vascular anatomy. Carotid stenosis measurements (when applicable) are obtained utilizing NASCET criteria, using the distal internal carotid diameter as the denominator. CONTRAST:  37m OMNIPAQUE IOHEXOL 350 MG/ML SOLN COMPARISON:  CTA head neck 01/02/2019 Brain MRI 12/15/2018 FINDINGS: CT HEAD FINDINGS Brain: Unchanged right parafalcine meningioma measuring 2.7 x 4.0 cm. Unchanged mild  adjacent edema. There are scattered parenchymal calcifications in an unchanged pattern. There is hypoattenuation of the right pons consistent with known infarcts. No hemorrhage. Skull: The visualized skull base, calvarium and extracranial soft tissues are normal. Sinuses/Orbits: No fluid levels or advanced mucosal thickening of the visualized paranasal sinuses. No mastoid or middle ear effusion. The orbits are normal. CTA NECK FINDINGS SKELETON: There is no bony spinal canal stenosis. No lytic or blastic lesion. OTHER NECK: Normal pharynx, larynx and major salivary glands. No cervical lymphadenopathy. Unremarkable thyroid gland. UPPER CHEST: There are opacities in the right upper lobe likely indicating aspiration. Incidentally noted azygos fissure. AORTIC ARCH: There is mild calcific atherosclerosis of the aortic arch. There is no aneurysm, dissection or hemodynamically significant stenosis of the visualized portion of the aorta. Conventional 3 vessel aortic branching pattern. The visualized proximal subclavian arteries are widely patent. RIGHT CAROTID SYSTEM: There is calcification at the bifurcation and extending to the proximal ICA without hemodynamically significant stenosis. No dissection or other acute abnormality. LEFT CAROTID SYSTEM: No dissection, occlusion or aneurysm. There is mixed density atherosclerosis extending into the proximal ICA, resulting in less than 50% stenosis. VERTEBRAL ARTERIES: Left dominant configuration. Both origins are clearly patent. There is no dissection, occlusion or flow-limiting stenosis to the skull base (V1-V3 segments). CTA HEAD FINDINGS POSTERIOR CIRCULATION: --Vertebral arteries: Normal V4 segments. --Posterior inferior cerebellar arteries (PICA): Patent origins from the vertebral arteries. --Anterior inferior cerebellar arteries (AICA): Patent origins from the basilar artery. 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  The distal portion remains opacified but diminutive. --Superior cerebellar arteries: Normal. --Posterior cerebral arteries: Normal. The left PCA is predominantly supplied by the posterior communicating artery. ANTERIOR CIRCULATION: --Intracranial internal carotid arteries: Atherosclerotic calcification of the internal carotid arteries at the skull base without hemodynamically significant stenosis. --Anterior cerebral arteries (ACA): Normal. Absent left A1 segment, normal variant --Middle cerebral arteries (MCA): Normal. VENOUS SINUSES: As permitted by contrast timing, patent. ANATOMIC VARIANTS: None Review of the MIP images confirms the above findings. IMPRESSION: 1. 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 The distal portion remains opacified but diminutive. 2. Right upper lobe opacities likely indicating aspiration. 3. Unchanged head CT with multiple subacute infarcts and large right parafalcine meningioma. 4. Bilateral carotid bifurcation atherosclerosis without hemodynamically significant  stenosis according to NASCET criteria measurements. 5.  Aortic atherosclerosis (ICD10-I70.0). Electronically Signed   By: Ulyses Jarred M.D.   On: 01/09/2019 21:57   Dg Abd 1 View  Result Date: 01/10/2019 CLINICAL DATA:  OG tube placement EXAM: ABDOMEN - 1 VIEW COMPARISON:  None. FINDINGS: Enteric tube terminates in the mid gastric body. Nonobstructive bowel gas pattern. Visualized osseous structures are within normal limits. IMPRESSION: Enteric tube terminates in the mid gastric body. Electronically Signed   By: Julian Hy M.D.   On: 01/10/2019 10:13   Ct Head Wo Contrast  Result Date: 01/10/2019 CLINICAL DATA:  CVA EXAM: CT HEAD WITHOUT CONTRAST TECHNIQUE: Contiguous axial images were obtained from the base of the skull through the vertex without intravenous contrast. COMPARISON:  Brain MRI from 2 days ago. Head CT and CTA from yesterday FINDINGS: Brain: Unchanged  patchy acute infarct in the right more than left pons, right midbrain, and right cerebellum. 3.5 cm presumed meningioma in the high right parafalcine region. Negative for hemorrhage or hydrocephalus. Vascular: Extensive atherosclerotic calcification. Skull: Negative Sinuses/Orbits: Negative IMPRESSION: 1. Unchanged acute posterior circulation infarcts without hemorrhage. 2. Known presumed meningioma in the high right parafalcine region. Electronically Signed   By: Monte Fantasia M.D.   On: 01/10/2019 08:56   Ct Head Wo Contrast  Result Date: 01/10/2019 CLINICAL DATA:  Increased weakness, recent stroke EXAM: CT HEAD WITHOUT CONTRAST TECHNIQUE: Contiguous axial images were obtained from the base of the skull through the vertex without intravenous contrast. COMPARISON:  01/02/2019 FINDINGS: Brain: Stable size and appearance of ovoid hyperattenuating 3.1 x 2.8 x 2.4 cm right parafalcine mass compatible with meningioma. There is mild mass effect and edema within the adjacent white matter. Low-density changes within the pons and right cerebellum compatible with known infarcts. There is no CT evidence of a new infarct. No intracranial hemorrhage. No hydrocephalus. Vascular: Mild atherosclerotic calcifications involving the large vessels of the skull base. Skull: Normal. Negative for fracture or focal lesion. Sinuses/Orbits: No acute finding. Other: None. IMPRESSION: 1. Evolving low-density changes within the pons and right cerebellum compatible with known acute/subacute infarcts. No evidence of hemorrhagic conversion. 2. Stable appearance of 3.1 cm right parafalcine meningioma with mild adjacent edema. Electronically Signed   By: Davina Poke M.D.   On: 01/07/2019 14:14   Ct Angio Neck W Or Wo Contrast  Result Date: 01/09/2019 CLINICAL DATA:  Left-sided weakness and cephalopathy. EXAM: CT ANGIOGRAPHY HEAD AND NECK TECHNIQUE: Multidetector CT imaging of the head and neck was performed using the standard  protocol during bolus administration of intravenous contrast. Multiplanar CT image reconstructions and MIPs were obtained to evaluate the vascular anatomy. Carotid stenosis measurements (when applicable) are obtained utilizing NASCET criteria, using the distal internal carotid diameter as the denominator. CONTRAST:  49m OMNIPAQUE IOHEXOL 350 MG/ML SOLN COMPARISON:  CTA head neck 01/02/2019 Brain MRI 01/07/2019 FINDINGS: CT HEAD FINDINGS Brain: Unchanged right parafalcine meningioma measuring 2.7 x 4.0 cm. Unchanged mild adjacent edema. There are scattered parenchymal calcifications in an unchanged pattern. There is hypoattenuation of the right pons consistent with known infarcts. No hemorrhage. Skull: The visualized skull base, calvarium and extracranial soft tissues are normal. Sinuses/Orbits: No fluid levels or advanced mucosal thickening of the visualized paranasal sinuses. No mastoid or middle ear effusion. The orbits are normal. CTA NECK FINDINGS SKELETON: There is no bony spinal canal stenosis. No lytic or blastic lesion. OTHER NECK: Normal pharynx, larynx and major salivary glands. No cervical lymphadenopathy. Unremarkable thyroid gland. UPPER CHEST: There  are opacities in the right upper lobe likely indicating aspiration. Incidentally noted azygos fissure. AORTIC ARCH: There is mild calcific atherosclerosis of the aortic arch. There is no aneurysm, dissection or hemodynamically significant stenosis of the visualized portion of the aorta. Conventional 3 vessel aortic branching pattern. The visualized proximal subclavian arteries are widely patent. RIGHT CAROTID SYSTEM: There is calcification at the bifurcation and extending to the proximal ICA without hemodynamically significant stenosis. No dissection or other acute abnormality. LEFT CAROTID SYSTEM: No dissection, occlusion or aneurysm. There is mixed density atherosclerosis extending into the proximal ICA, resulting in less than 50% stenosis. VERTEBRAL  ARTERIES: Left dominant configuration. Both origins are clearly patent. There is no dissection, occlusion or flow-limiting stenosis to the skull base (V1-V3 segments). CTA HEAD FINDINGS POSTERIOR CIRCULATION: --Vertebral arteries: Normal V4 segments. --Posterior inferior cerebellar arteries (PICA): Patent origins from the vertebral arteries. --Anterior inferior cerebellar arteries (AICA): Patent origins from the basilar artery. 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 The distal portion remains opacified but diminutive. --Superior cerebellar arteries: Normal. --Posterior cerebral arteries: Normal. The left PCA is predominantly supplied by the posterior communicating artery. ANTERIOR CIRCULATION: --Intracranial internal carotid arteries: Atherosclerotic calcification of the internal carotid arteries at the skull base without hemodynamically significant stenosis. --Anterior cerebral arteries (ACA): Normal. Absent left A1 segment, normal variant --Middle cerebral arteries (MCA): Normal. VENOUS SINUSES: As permitted by contrast timing, patent. ANATOMIC VARIANTS: None Review of the MIP images confirms the above findings. IMPRESSION: 1. 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 The distal portion remains opacified but diminutive. 2. Right upper lobe opacities likely indicating aspiration. 3. Unchanged head CT with multiple subacute infarcts and large right parafalcine meningioma. 4. Bilateral carotid bifurcation atherosclerosis without hemodynamically significant stenosis according to NASCET criteria measurements. 5.  Aortic atherosclerosis (ICD10-I70.0). Electronically Signed   By: Ulyses Jarred M.D.   On: 01/09/2019 21:57   Mr Brain Wo Contrast  Result Date: 01/12/2019 CLINICAL DATA:  Focal neuro deficit greater than 6 hours, stroke suspected. Recent stroke. Increased confusion, slurred speech, and drowsiness. EXAM: MRI  HEAD WITHOUT CONTRAST TECHNIQUE: Multiplanar, multiecho pulse sequences of the brain and surrounding structures were obtained without intravenous contrast. COMPARISON:  Head CT 12/18/2018, CTA 01/02/2019, and MRI 01/01/2019 FINDINGS: Brain: There are right cerebellar and pontine infarcts as seen on the recent prior MRI, however there are multiple new acute infarcts in the right cerebellum, right greater than left/midline pons, midbrain/right cerebral peduncle, right occipital lobe, posterior mesial right temporal lobe, and right thalamus. No intracranial hemorrhage, midline shift, or extra-axial fluid collection is identified. A 3 cm para falcine mass in the right parietal region with mild surrounding edema is unchanged from the prior MRI with mild local mass effect. Small foci of T2 hyperintensity in the cerebral white matter bilaterally are unchanged and nonspecific but compatible with mild chronic small vessel ischemic disease. A small chronic cortical infarct is again noted in the right frontal operculum. 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 Abnormal appearance of the non dominant distal right vertebral artery as well. Mild generalized intracranial arterial dolichoectasia. Skull and upper cervical spine: Unremarkable bone marrow signal. Sinuses/Orbits: Unremarkable orbits. Paranasal sinuses and mastoid air cells are clear. Other: None. IMPRESSION: 1. Multiple new acute posterior circulation infarcts. 2. Abnormal appearance of the basilar artery and distal right vertebral artery with a near occlusive stenosis of the proximal basilar artery on  recent CTA. 3. Unchanged 3 cm right parietal parafalcine meningioma with mild edema. 4. Mild chronic small vessel ischemic disease. Electronically Signed   By: Logan Bores M.D.   On: 12/19/2018 18:01   Dg Chest Port 1 View  Result Date: 01/10/2019 CLINICAL DATA:  Shortness of breath. EXAM: PORTABLE  CHEST 1 VIEW COMPARISON:  01/07/2019 FINDINGS: The cardiac silhouette, mediastinal and hilar contours are stable. There is tortuosity and ectasia of the thoracic aorta. The pulmonary hila appear normal. Stable mild eventration right hemidiaphragm. No definite infiltrates, edema or effusions. The bony thorax is intact. IMPRESSION: No acute cardiopulmonary findings. Electronically Signed   By: Marijo Sanes M.D.   On: 01/10/2019 06:46   Dg Chest Port 1 View  Result Date: 01/10/2019 CLINICAL DATA:  Endotracheal tube placement. EXAM: PORTABLE CHEST 1 VIEW COMPARISON:  Earlier film, same date. FINDINGS: The cardiac silhouette, mediastinal and hilar contours are stable. Stable tortuosity and ectasia of the thoracic aorta. The NG tube is stable. New endotracheal tube in good position with its tip 4.2 cm above the carina. IMPRESSION: Endotracheal tube in good position, 4 cm above the carina. Electronically Signed   By: Marijo Sanes M.D.   On: 01/10/2019 06:45   Dg Chest Port 1 View  Result Date: 01/10/2019 CLINICAL DATA:  NG tube placement. EXAM: PORTABLE CHEST 1 VIEW COMPARISON:  Earlier film, same date. FINDINGS: The cardiac silhouette, mediastinal and hilar contours are stable. Stable tortuosity and ectasia of the thoracic aorta. The lungs are clear. New NG tube is coursing down the esophagus and into the stomach. The upper abdominal bowel gas pattern is unremarkable. IMPRESSION: No acute cardiopulmonary findings. NG tube tip is in the fundal region of the stomach. Electronically Signed   By: Marijo Sanes M.D.   On: 01/10/2019 06:43   Dg Chest Portable 1 View  Result Date: 12/15/2018 CLINICAL DATA:  Shortness of breath EXAM: PORTABLE CHEST 1 VIEW COMPARISON:  None. FINDINGS: Lungs are clear. Heart size and pulmonary vascularity are normal. No adenopathy. No bone lesions. There is an azygos lobe on the right, an anatomic variant. IMPRESSION: No edema or consolidation.  No evident adenopathy.  Electronically Signed   By: Lowella Grip III M.D.   On: 01/11/2019 15:02    Medications:  I have reviewed the patient's current medications. Scheduled: . aspirin  325 mg Per Tube Daily  . chlorhexidine gluconate (MEDLINE KIT)  15 mL Mouth Rinse BID  . Chlorhexidine Gluconate Cloth  6 each Topical Daily  . enoxaparin (LOVENOX) injection  40 mg Subcutaneous Q24H  . [START ON 01/11/2019] mouth rinse  15 mL Mouth Rinse BID  . mouth rinse  15 mL Mouth Rinse 10 times per day  . pantoprazole (PROTONIX) IV  40 mg Intravenous Daily  . potassium chloride  40 mEq Per Tube Once    Assessment/Plan: 58 year old male admitted with recurrent posterior circulation infarcts.  Required intubation overnight.  Head CT stable.  Remains on ASA.    Recommendations: 1. EEG 2. Now that NGT in place would give Plavix 20m daily in addition to ASA. 3. PT/OT and speech evaluations 4. Frequent neuro checks 5. Telemetry  6. Permissive BP control    LOS: 1 day   LAlexis Goodell MD Neurology [phone]/28/2020  12:44 PM

## 2019-01-10 NOTE — Procedures (Signed)
Intubation Procedure Note Paul Leonard [id number] [birth year]  Procedure: Intubation Indications: Airway protection and maintenance  Procedure Details Consent: Unable to obtain consent because of altered level of consciousness. Time Out: Verified patient identification, verified procedure, site/side was marked, verified correct patient position, special equipment/implants available, medications/allergies/relevent history reviewed, required imaging and test results available.  Performed  Maximum sterile technique was used including gloves, hand hygiene and mask.  4    Evaluation Hemodynamic Status: BP stable throughout; O2 sats: stable throughout Patient's Current Condition: stable Complications: No apparent complications Patient did tolerate procedure well. Chest X-ray ordered to verify placement.  CXR: pending.   Procedure was performed using Glidescope allowing for direct visualization of passage of ETT through vocal cords.  Size 7.5 ETT  Color change noted on ET CO2 detector,condensation noted in ETT, bilateral breath sounds auscultated.  Tube secured at the 22 cm mark at the lip.  Pt was sedated with 100 mcg Fentanyl, 20 Etomidate, 50 Rocuronium      Darel Hong, [facility] Pulmonary [facility] Critical [facility] Medicine Pager: [phone]  Paul Leonard 01/10/2019

## 2019-01-10 NOTE — Consult Note (Signed)
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  The severe nature of his stroke ENT has been asked to evaluate for possible tracheostomy tube placement  Allergies: No Known Allergies  ROS: Review of systems normal other than 12 systems except per HPI.  PMH:  Past Medical History:  Diagnosis Date  . Hypertension     FH: History reviewed. No pertinent family history.  SH:  Social History   Socioeconomic History  . Marital status: Single    Spouse name: Not on file  . Number of children: Not on file  . Years of education: Not on file  . Highest education level: Not on file  Occupational History  . Not on file  Social Needs  . Financial resource strain: Not on file  . Food insecurity    Worry: Not on file    Inability: Not on file  . Transportation needs    Medical: Not on file    Non-medical: Not on file  Tobacco Use  . Smoking status: Current Every Day Smoker    Packs/day: 1.00    Types: Cigarettes  . Smokeless tobacco: Never Used  Substance and Sexual Activity  . Alcohol use: Yes    Alcohol/week: 1.0 standard drinks    Types: 1 Cans of beer per week  . Drug use: Yes    Types: Cocaine    Comment: Last used 10/16  . Sexual activity: Not on file  Lifestyle  . Physical activity    Days per week: Not on file    Minutes per session: Not on file  . Stress: Not on file  Relationships  . Social Herbalist on phone: Not on file    Gets together: Not on file    Attends religious service: Not on file    Active member of club or organization: Not on file    Attends meetings of clubs or organizations: Not on file    Relationship status: Not on file  . Intimate partner violence    Fear of current or ex partner: Not on file    Emotionally abused: Not on file   Physically abused: Not on file    Forced sexual activity: Not on file  Other Topics Concern  . Not on file  Social History Narrative  . Not on file    PSH: History reviewed. No pertinent surgical history.  Physical  Exam: Patient is intubated sedated, he is not obese, he does not appear to have any anterior neck scarring or history of previous surgery.   A/P: Prolonged intubation following severe CVA.  Agree with ICU assessment patient will require long-term ventilation we will try to schedule tracheostomy as soon as possible.  Assessed in the ICU proximately 30 minutes.   Roena Malady 01/10/2019 4:22 PM

## 2019-01-10 NOTE — Progress Notes (Signed)
At [place] Delice Lesch, NP that unable to arouse pt as in previous assessments.   Pt without eye opening.  Pupils bilaterally 1 Fixed. No spontaneous movement.  Some withdrawal for pain. See NIH assessments.

## 2019-01-10 NOTE — Consult Note (Signed)
Pharmacy Antibiotic Note  Paul Leonard is a 58 y.o. male admitted on 01/03/2019 with pneumonia.  Patient was on vancomycin and Unasyn. (-) MRSA PCR and pseudomonas like smell during nursing evaluation. As discussed on rounds, will d/c vancomycin and Unasyn. To broaden coverage, Pharmacy has been consulted for zosyn dosing.  Plan: -CrCl: 95.5 -Initate Zosyn 3.375g infused over 4 hours q8hr -Monitor renal function and s/sx of infections and adverse effects  Height: 5\' 10"  (177.8 cm) Weight: 170 lb 10.2 oz (77.4 kg) IBW/kg (Calculated) : 73  Temp (24hrs), Avg:100.5 F (38.1 C), Min:98.5 F (36.9 C), Max:102.2 F (39 C)  Recent Labs  Lab 01/04/19 0711 12/23/2018 1353 01/09/19 2007 01/09/19 2239 01/10/19 0131  WBC  --  7.3  --  7.0  --   CREATININE 0.97 1.21 0.96 0.94 0.86  LATICACIDVEN  --   --   --  1.5  --     Estimated Creatinine Clearance: 95.5 mL/min (by C-G formula based on SCr of 0.86 mg/dL).    No Known Allergies  Antimicrobials this admission: Vancomycin 10/27>>10/28 Unasyn 10/27>>10/28 Zosyn 10/28>>    Microbiology results: 10/27 BCx: pending, no growth <12hr 10/27 MRSA PCR: negative 10/26 SARs-CoV-2: negative  Thank you for allowing pharmacy to be a part of this patient's care.  Sallye Lat, PharmD Candidate 01/10/2019 11:40 AM

## 2019-01-10 NOTE — Progress Notes (Signed)
eeg completed ° °

## 2019-01-10 NOTE — Progress Notes (Signed)
Patient transported to CT via transport vent.  Patient tolerated transport well.

## 2019-01-10 NOTE — Progress Notes (Signed)
Progress Note  Patient Name: Paul Leonard Date of Encounter: 01/10/2019  Primary Cardiologist: New- Dr. Fletcher Anon  Subjective   Patient was seen this morning, intubated last night due to worsening mental status.  Had head CT earlier this morning.  Inpatient Medications    Scheduled Meds:  aspirin  325 mg Per Tube Daily   chlorhexidine gluconate (MEDLINE KIT)  15 mL Mouth Rinse BID   Chlorhexidine Gluconate Cloth  6 each Topical Daily   enoxaparin (LOVENOX) injection  40 mg Subcutaneous Q24H   [START ON 01/11/2019] mouth rinse  15 mL Mouth Rinse BID   mouth rinse  15 mL Mouth Rinse 10 times per day   pantoprazole (PROTONIX) IV  40 mg Intravenous Daily   Continuous Infusions:  sodium chloride Stopped (01/10/19 0744)   sodium chloride 250 mL (01/10/19 0943)   piperacillin-tazobactam (ZOSYN)  IV     propofol (DIPRIVAN) infusion 10 mcg/kg/min (01/10/19 0743)   PRN Meds: sodium chloride, acetaminophen **OR** acetaminophen (TYLENOL) oral liquid 160 mg/5 mL **OR** acetaminophen, fentaNYL (SUBLIMAZE) injection, fentaNYL (SUBLIMAZE) injection, ipratropium-albuterol, labetalol, midazolam, midazolam, senna-docusate   Vital Signs    Vitals:   01/10/19 0650 01/10/19 0700 01/10/19 0725 01/10/19 0830  BP: (!) 158/95 (!) 158/103    Pulse: 85 77    Resp: 16 15    Temp:   98.5 F (36.9 C)   TempSrc:   Axillary   SpO2: 97% 95%  95%  Weight:      Height:        Intake/Output Summary (Last 24 hours) at 01/10/2019 1115 Last data filed at 01/10/2019 0700 Gross per 24 hour  Intake 2405.71 ml  Output 700 ml  Net 1705.71 ml   Last 3 Weights 01/09/2019 01/06/2019 01/01/2019  Weight (lbs) 170 lb 10.2 oz 184 lb 15.5 oz 185 lb  Weight (kg) 77.4 kg 83.9 kg 83.915 kg      Telemetry    Sinus rhythm, frequent PACs.- Personally Reviewed   Physical Exam   GEN:  Intubated no purposeful movements.   Neck: No JVD Cardiac: RRR, no murmurs, rubs, or gallops.  Respiratory:   Vented breath sounds. GI: Soft, nontender, non-distended  MS: No edema; No deformity. Neuro:   Intubated, does not follow commands Psych: Normal affect   Labs    High Sensitivity Troponin:   Recent Labs  Lab 12/20/2018 1353 01/09/2019 1728 01/09/19 2347 01/10/19 0131  TROPONINIHS 8 10 33* 41*      Chemistry Recent Labs  Lab 12/23/2018 1353 01/09/19 2007 01/09/19 2239 01/10/19 0131  NA 141  --  140 142  K 3.6  --  3.3* 3.4*  CL 99  --  105 107  CO2 30  --  24 23  GLUCOSE 110*  --  92 95  BUN 25*  --  28* 25*  CREATININE 1.21 0.96 0.94 0.86  CALCIUM 9.2  --  8.4* 8.2*  PROT 7.0  --  6.3*  --   ALBUMIN 3.8  --  3.3*  --   AST 16  --  16  --   ALT 21  --  17  --   ALKPHOS 60  --  58  --   BILITOT 1.2  --  1.4*  --   GFRNONAA >60 >60 >60 >60  GFRAA >60 >60 >60 >60  ANIONGAP 12  --  11 12     Hematology Recent Labs  Lab 01/07/2019 1353 01/09/19 2239  WBC 7.3 7.0  RBC  6.12* 5.92*  HGB 17.4* 16.8  HCT 53.7* 53.0*  MCV 87.7 89.5  MCH 28.4 28.4  MCHC 32.4 31.7  RDW 12.9 12.9  PLT 353 320    BNPNo results for input(s): BNP, PROBNP in the last 168 hours.   DDimer No results for input(s): DDIMER in the last 168 hours.   Radiology    Ct Angio Head W Or Wo Contrast  Result Date: 01/09/2019 CLINICAL DATA:  Left-sided weakness and cephalopathy. EXAM: CT ANGIOGRAPHY HEAD AND NECK TECHNIQUE: Multidetector CT imaging of the head and neck was performed using the standard protocol during bolus administration of intravenous contrast. Multiplanar CT image reconstructions and MIPs were obtained to evaluate the vascular anatomy. Carotid stenosis measurements (when applicable) are obtained utilizing NASCET criteria, using the distal internal carotid diameter as the denominator. CONTRAST:  29m OMNIPAQUE IOHEXOL 350 MG/ML SOLN COMPARISON:  CTA head neck 01/02/2019 Brain MRI 12/18/2018 FINDINGS: CT HEAD FINDINGS Brain: Unchanged right parafalcine meningioma measuring 2.7 x 4.0 cm.  Unchanged mild adjacent edema. There are scattered parenchymal calcifications in an unchanged pattern. There is hypoattenuation of the right pons consistent with known infarcts. No hemorrhage. Skull: The visualized skull base, calvarium and extracranial soft tissues are normal. Sinuses/Orbits: No fluid levels or advanced mucosal thickening of the visualized paranasal sinuses. No mastoid or middle ear effusion. The orbits are normal. CTA NECK FINDINGS SKELETON: There is no bony spinal canal stenosis. No lytic or blastic lesion. OTHER NECK: Normal pharynx, larynx and major salivary glands. No cervical lymphadenopathy. Unremarkable thyroid gland. UPPER CHEST: There are opacities in the right upper lobe likely indicating aspiration. Incidentally noted azygos fissure. AORTIC ARCH: There is mild calcific atherosclerosis of the aortic arch. There is no aneurysm, dissection or hemodynamically significant stenosis of the visualized portion of the aorta. Conventional 3 vessel aortic branching pattern. The visualized proximal subclavian arteries are widely patent. RIGHT CAROTID SYSTEM: There is calcification at the bifurcation and extending to the proximal ICA without hemodynamically significant stenosis. No dissection or other acute abnormality. LEFT CAROTID SYSTEM: No dissection, occlusion or aneurysm. There is mixed density atherosclerosis extending into the proximal ICA, resulting in less than 50% stenosis. VERTEBRAL ARTERIES: Left dominant configuration. Both origins are clearly patent. There is no dissection, occlusion or flow-limiting stenosis to the skull base (V1-V3 segments). CTA HEAD FINDINGS POSTERIOR CIRCULATION: --Vertebral arteries: Normal V4 segments. --Posterior inferior cerebellar arteries (PICA): Patent origins from the vertebral arteries. --Anterior inferior cerebellar arteries (AICA): Patent origins from the basilar artery. 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 The distal portion remains opacified but diminutive. --Superior cerebellar arteries: Normal. --Posterior cerebral arteries: Normal. The left PCA is predominantly supplied by the posterior communicating artery. ANTERIOR CIRCULATION: --Intracranial internal carotid arteries: Atherosclerotic calcification of the internal carotid arteries at the skull base without hemodynamically significant stenosis. --Anterior cerebral arteries (ACA): Normal. Absent left A1 segment, normal variant --Middle cerebral arteries (MCA): Normal. VENOUS SINUSES: As permitted by contrast timing, patent. ANATOMIC VARIANTS: None Review of the MIP images confirms the above findings. IMPRESSION: 1. 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 The distal portion remains opacified but diminutive. 2. Right upper lobe opacities likely indicating aspiration. 3. Unchanged head CT with multiple subacute infarcts and large right parafalcine meningioma. 4. Bilateral carotid bifurcation atherosclerosis without hemodynamically significant stenosis according to NASCET criteria measurements. 5.  Aortic atherosclerosis (ICD10-I70.0). Electronically Signed   By: KUlyses JarredM.D.   On: 01/09/2019 21:57  Dg Abd 1 View  Result Date: 01/10/2019 CLINICAL DATA:  OG tube placement EXAM: ABDOMEN - 1 VIEW COMPARISON:  None. FINDINGS: Enteric tube terminates in the mid gastric body. Nonobstructive bowel gas pattern. Visualized osseous structures are within normal limits. IMPRESSION: Enteric tube terminates in the mid gastric body. Electronically Signed   By: Julian Hy M.D.   On: 01/10/2019 10:13   Ct Head Wo Contrast  Result Date: 01/10/2019 CLINICAL DATA:  CVA EXAM: CT HEAD WITHOUT CONTRAST TECHNIQUE: Contiguous axial images were obtained from the base of the skull through the vertex without intravenous contrast. COMPARISON:  Brain MRI from 2 days ago. Head CT and CTA from yesterday FINDINGS:  Brain: Unchanged patchy acute infarct in the right more than left pons, right midbrain, and right cerebellum. 3.5 cm presumed meningioma in the high right parafalcine region. Negative for hemorrhage or hydrocephalus. Vascular: Extensive atherosclerotic calcification. Skull: Negative Sinuses/Orbits: Negative IMPRESSION: 1. Unchanged acute posterior circulation infarcts without hemorrhage. 2. Known presumed meningioma in the high right parafalcine region. Electronically Signed   By: Monte Fantasia M.D.   On: 01/10/2019 08:56   Ct Head Wo Contrast  Result Date: 01/07/2019 CLINICAL DATA:  Increased weakness, recent stroke EXAM: CT HEAD WITHOUT CONTRAST TECHNIQUE: Contiguous axial images were obtained from the base of the skull through the vertex without intravenous contrast. COMPARISON:  01/02/2019 FINDINGS: Brain: Stable size and appearance of ovoid hyperattenuating 3.1 x 2.8 x 2.4 cm right parafalcine mass compatible with meningioma. There is mild mass effect and edema within the adjacent white matter. Low-density changes within the pons and right cerebellum compatible with known infarcts. There is no CT evidence of a new infarct. No intracranial hemorrhage. No hydrocephalus. Vascular: Mild atherosclerotic calcifications involving the large vessels of the skull base. Skull: Normal. Negative for fracture or focal lesion. Sinuses/Orbits: No acute finding. Other: None. IMPRESSION: 1. Evolving low-density changes within the pons and right cerebellum compatible with known acute/subacute infarcts. No evidence of hemorrhagic conversion. 2. Stable appearance of 3.1 cm right parafalcine meningioma with mild adjacent edema. Electronically Signed   By: Davina Poke M.D.   On: 01/09/2019 14:14   Ct Angio Neck W Or Wo Contrast  Result Date: 01/09/2019 CLINICAL DATA:  Left-sided weakness and cephalopathy. EXAM: CT ANGIOGRAPHY HEAD AND NECK TECHNIQUE: Multidetector CT imaging of the head and neck was performed using  the standard protocol during bolus administration of intravenous contrast. Multiplanar CT image reconstructions and MIPs were obtained to evaluate the vascular anatomy. Carotid stenosis measurements (when applicable) are obtained utilizing NASCET criteria, using the distal internal carotid diameter as the denominator. CONTRAST:  62m OMNIPAQUE IOHEXOL 350 MG/ML SOLN COMPARISON:  CTA head neck 01/02/2019 Brain MRI 12/31/2018 FINDINGS: CT HEAD FINDINGS Brain: Unchanged right parafalcine meningioma measuring 2.7 x 4.0 cm. Unchanged mild adjacent edema. There are scattered parenchymal calcifications in an unchanged pattern. There is hypoattenuation of the right pons consistent with known infarcts. No hemorrhage. Skull: The visualized skull base, calvarium and extracranial soft tissues are normal. Sinuses/Orbits: No fluid levels or advanced mucosal thickening of the visualized paranasal sinuses. No mastoid or middle ear effusion. The orbits are normal. CTA NECK FINDINGS SKELETON: There is no bony spinal canal stenosis. No lytic or blastic lesion. OTHER NECK: Normal pharynx, larynx and major salivary glands. No cervical lymphadenopathy. Unremarkable thyroid gland. UPPER CHEST: There are opacities in the right upper lobe likely indicating aspiration. Incidentally noted azygos fissure. AORTIC ARCH: There is mild calcific atherosclerosis of the aortic arch. There is  no aneurysm, dissection or hemodynamically significant stenosis of the visualized portion of the aorta. Conventional 3 vessel aortic branching pattern. The visualized proximal subclavian arteries are widely patent. RIGHT CAROTID SYSTEM: There is calcification at the bifurcation and extending to the proximal ICA without hemodynamically significant stenosis. No dissection or other acute abnormality. LEFT CAROTID SYSTEM: No dissection, occlusion or aneurysm. There is mixed density atherosclerosis extending into the proximal ICA, resulting in less than 50% stenosis.  VERTEBRAL ARTERIES: Left dominant configuration. Both origins are clearly patent. There is no dissection, occlusion or flow-limiting stenosis to the skull base (V1-V3 segments). CTA HEAD FINDINGS POSTERIOR CIRCULATION: --Vertebral arteries: Normal V4 segments. --Posterior inferior cerebellar arteries (PICA): Patent origins from the vertebral arteries. --Anterior inferior cerebellar arteries (AICA): Patent origins from the basilar artery. 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 The distal portion remains opacified but diminutive. --Superior cerebellar arteries: Normal. --Posterior cerebral arteries: Normal. The left PCA is predominantly supplied by the posterior communicating artery. ANTERIOR CIRCULATION: --Intracranial internal carotid arteries: Atherosclerotic calcification of the internal carotid arteries at the skull base without hemodynamically significant stenosis. --Anterior cerebral arteries (ACA): Normal. Absent left A1 segment, normal variant --Middle cerebral arteries (MCA): Normal. VENOUS SINUSES: As permitted by contrast timing, patent. ANATOMIC VARIANTS: None Review of the MIP images confirms the above findings. IMPRESSION: 1. 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 The distal portion remains opacified but diminutive. 2. Right upper lobe opacities likely indicating aspiration. 3. Unchanged head CT with multiple subacute infarcts and large right parafalcine meningioma. 4. Bilateral carotid bifurcation atherosclerosis without hemodynamically significant stenosis according to NASCET criteria measurements. 5.  Aortic atherosclerosis (ICD10-I70.0). Electronically Signed   By: Ulyses Jarred M.D.   On: 01/09/2019 21:57   Mr Brain Wo Contrast  Result Date: 01/06/2019 CLINICAL DATA:  Focal neuro deficit greater than 6 hours, stroke suspected. Recent stroke. Increased confusion, slurred speech, and drowsiness.  EXAM: MRI HEAD WITHOUT CONTRAST TECHNIQUE: Multiplanar, multiecho pulse sequences of the brain and surrounding structures were obtained without intravenous contrast. COMPARISON:  Head CT 01/03/2019, CTA 01/02/2019, and MRI 01/01/2019 FINDINGS: Brain: There are right cerebellar and pontine infarcts as seen on the recent prior MRI, however there are multiple new acute infarcts in the right cerebellum, right greater than left/midline pons, midbrain/right cerebral peduncle, right occipital lobe, posterior mesial right temporal lobe, and right thalamus. No intracranial hemorrhage, midline shift, or extra-axial fluid collection is identified. A 3 cm para falcine mass in the right parietal region with mild surrounding edema is unchanged from the prior MRI with mild local mass effect. Small foci of T2 hyperintensity in the cerebral white matter bilaterally are unchanged and nonspecific but compatible with mild chronic small vessel ischemic disease. A small chronic cortical infarct is again noted in the right frontal operculum. 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 Abnormal appearance of the non dominant distal right vertebral artery as well. Mild generalized intracranial arterial dolichoectasia. Skull and upper cervical spine: Unremarkable bone marrow signal. Sinuses/Orbits: Unremarkable orbits. Paranasal sinuses and mastoid air cells are clear. Other: None. IMPRESSION: 1. Multiple new acute posterior circulation infarcts. 2. 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 3. Unchanged 3 cm right parietal parafalcine meningioma with mild edema. 4. Mild chronic small vessel ischemic disease. Electronically Signed   By: Zenia Resides  Jeralyn Ruths M.D.   On: 12/18/2018 18:01   Dg Chest Port 1 View  Result Date: 01/10/2019 CLINICAL DATA:  Shortness of breath. EXAM:  PORTABLE CHEST 1 VIEW COMPARISON:  01/10/2019 FINDINGS: The cardiac silhouette, mediastinal and hilar contours are stable. There is tortuosity and ectasia of the thoracic aorta. The pulmonary hila appear normal. Stable mild eventration right hemidiaphragm. No definite infiltrates, edema or effusions. The bony thorax is intact. IMPRESSION: No acute cardiopulmonary findings. Electronically Signed   By: Marijo Sanes M.D.   On: 01/10/2019 06:46   Dg Chest Port 1 View  Result Date: 01/10/2019 CLINICAL DATA:  Endotracheal tube placement. EXAM: PORTABLE CHEST 1 VIEW COMPARISON:  Earlier film, same date. FINDINGS: The cardiac silhouette, mediastinal and hilar contours are stable. Stable tortuosity and ectasia of the thoracic aorta. The NG tube is stable. New endotracheal tube in good position with its tip 4.2 cm above the carina. IMPRESSION: Endotracheal tube in good position, 4 cm above the carina. Electronically Signed   By: Marijo Sanes M.D.   On: 01/10/2019 06:45   Dg Chest Port 1 View  Result Date: 01/10/2019 CLINICAL DATA:  NG tube placement. EXAM: PORTABLE CHEST 1 VIEW COMPARISON:  Earlier film, same date. FINDINGS: The cardiac silhouette, mediastinal and hilar contours are stable. Stable tortuosity and ectasia of the thoracic aorta. The lungs are clear. New NG tube is coursing down the esophagus and into the stomach. The upper abdominal bowel gas pattern is unremarkable. IMPRESSION: No acute cardiopulmonary findings. NG tube tip is in the fundal region of the stomach. Electronically Signed   By: Marijo Sanes M.D.   On: 01/10/2019 06:43   Dg Chest Portable 1 View  Result Date: 12/29/2018 CLINICAL DATA:  Shortness of breath EXAM: PORTABLE CHEST 1 VIEW COMPARISON:  None. FINDINGS: Lungs are clear. Heart size and pulmonary vascularity are normal. No adenopathy. No bone lesions. There is an azygos lobe on the right, an anatomic variant. IMPRESSION: No edema or consolidation.  No evident adenopathy.  Electronically Signed   By: Lowella Grip III M.D.   On: 12/22/2018 15:02    Cardiac Studies  Echo 01/10/2019 1. Left ventricular ejection fraction, by visual estimation, is 60 to 65%. The left ventricle has normal function. Normal left ventricular size. There is moderately increased left ventricular hypertrophy.  2. There is suggestion of more pronounced thickening at the apex, raising the possibility of apical variant hypertrophic cardiomyopathy.  3. Elevated mean left atrial pressure.  4. Left ventricular diastolic Doppler parameters are consistent with pseudonormalization pattern of LV diastolic filling.  5. Global right ventricle has normal systolic function.The right ventricular size is normal. Mildly increased right ventricular wall thickness.  6. Left atrial size was elongated.  7. Right atrial size was normal.  8. Trivial pericardial effusion is present.  9. The mitral valve is normal in structure. Mild mitral valve regurgitation. 10. The tricuspid valve is grossly normal. Tricuspid valve regurgitation is trivial. 11. The aortic valve has an indeterminant number of cusps Aortic valve regurgitation was not visualized by color flow Doppler. Mild to moderate aortic valve sclerosis/calcification without any evidence of aortic stenosis. 12. The pulmonic valve was not well visualized. Pulmonic valve regurgitation is not visualized by color flow Doppler. 13. Normal pulmonary artery systolic pressure. 14. The inferior vena cava is dilated in size with >50% respiratory variability, suggesting right atrial pressure of 8 mmHg.  Patient Profile     58 y.o. male with history of hypertension, CVA presents with altered mental  status and found to have abnormal EKG.  Initially, changes in the ECG were thought to be a STEMI but upon further evaluation, patient was not having an acute coronary syndrome and his EKG showed LVH with repolarization abnormalities.  His initial high-sensitivity troponins  were normal.  His echocardiogram found to have possible apical HCM.  Assessment & Plan    Patient is currently intubated due to worsening altered mental status.  His MRI of the brain did not reveal new/acute posterior circulation infarcts.  On my exam today, patient did not have any purposeful movement.  ?HCM Echocardiogram did show a possible apical HCM which may explain the LVH and ST changes found on the ECG. further evaluation with an MRI can be performed as outpatient.  2.  Hypertension -Systolic currently in the 150s.  Probably okay due to recent strokes.  3.  CVA, acute posterior circulation infarct, carotid artery stenosis -On suppository aspirin -Recommend statin when able to take p.o.  4.  Altered mental status, intubated -Management as per ICU team.      Signed, Kate Sable, MD  01/10/2019, 11:15 AM

## 2019-01-10 NOTE — Plan of Care (Signed)
  Problem: Education: Goal: Knowledge of secondary prevention will improve Outcome: Progressing Goal: Knowledge of patient specific risk factors addressed and post discharge goals established will improve Outcome: Progressing Goal: Individualized Educational Video(s) Outcome: Progressing   

## 2019-01-10 NOTE — Progress Notes (Signed)
Initial Nutrition Assessment  RD working remotely.  DOCUMENTATION CODES:   Not applicable  INTERVENTION:  If patient expected to remain intubated >24-48 hours consider initiating tube feeds pending goals of care.  If tube feeds are initiated recommend Vital 1.5 Cal at 50 mL/hr (1200 mL goal daily volume) + Pro-Stat 30 mL BID. Provides 2000 kcal, 111 grams of protein, 912 mL H2O daily. With last documented propofol rate provides 2122 kcal daily.  If tube feeds are initiated provide a minimum free water flush of 30 mL Q4hrs to maintain tube patency.   NUTRITION DIAGNOSIS:   Inadequate oral intake related to inability to eat as evidenced by NPO status.  GOAL:   Provide needs based on ASPEN/SCCM guidelines  MONITOR:   Vent status, Labs, Weight trends, I & O's  REASON FOR ASSESSMENT:   Ventilator    ASSESSMENT:   58 year old male with PMHx of HTN admitted on 10/26 with acute CVA, became unresponsive on late evening of 10/27 and transferred to ICU, developed acute hypoxic respiratory failure secondary to questionable aspiration, patient was intubated early AM of 10/28 for airway protection.   Patient is intubated. On PRVC mode with FiO2 35% and PEEP 5 cmH2O. According to chart patient had an episode of diarrhea yesterday. Limited weight history to trend but it appears patient has lost weight over time. In 2016 he was 88.5 kg. He isnow 77.4 kg (170.64 lbs).   Enteral Access: OGT placed 10/28; terminates in mid gastric body per abdominal x-ray 10/28; 65 cm at corner of mouth  MAP: 90-130 mmHg  Patient is currently intubated on ventilator support Ve: 8.6 L/min Temp (24hrs), Avg:100.5 F (38.1 C), Min:98.5 F (36.9 C), Max:102.2 F (39 C)  Propofol: 4.64 mL/hr (122 kcal daily)  Medications reviewed and include: pantoprazole, potassium chloride 40 mEq once daily per tube, NS at 100 mL/hr, Zosyn, propofol gtt.  Labs reviewed: CBG 81-99, Potassium 3.4, BUN 25.  I/O: 700 mL  UOP yesterday + 1 occurrence unmeasured UOP  NUTRITION - FOCUSED PHYSICAL EXAM:  Unable to complete at this time.  Diet Order:   Diet Order            Diet NPO time specified  Diet effective now             EDUCATION NEEDS:   No education needs have been identified at this time  Skin:  Skin Assessment: Reviewed RN Assessment(ecchymosis)  Last BM:  01/09/2019 - type 7  Height:   Ht Readings from Last 1 Encounters:  01/10/19 5\' 10"  (1.778 m)   Weight:   Wt Readings from Last 1 Encounters:  01/09/19 77.4 kg   Ideal Body Weight:  75.5 kg  BMI:  Body mass index is 24.48 kg/m.  Estimated Nutritional Needs:   Kcal:  2102 (PSU 2003b w/ MSJ 1599, Ve 8.6, Tmax 39)  Protein:  95-116 grams (1.2-1.5 grams/kg)  Fluid:  1.9 L/day  Willey Blade, MS, RD, LDN Office: [phone] Pager: [phone] After Hours/Weekend Pager: [phone]

## 2019-01-10 NOTE — Consult Note (Addendum)
Name: Paul Leonard MRN: [id number] DOB: 01/10/1961    ADMISSION DATE:  12/25/2018 CONSULTATION DATE:  01/09/2019  REFERRING MD :  Dr. Jannifer Franklin  CHIEF COMPLAINT:  Unresponsive  BRIEF PATIENT DESCRIPTION:  58 year old male admitted 10/07/202022 to [place] unit for acute CVA with left-sided weakness and aphasia.  MRI with multiple posterior circulation infarcts and stenosis proximal basilar artery.  Neurology following.  Late in the evening on 10/27 he became unresponsive requiring transfer to ICU.  Follow-up CTA head/neck unchanged from previous CT (multiple subacute infarcts and large right parafalcine meningioma), no significant carotid stenosis.  Did reveal concern for questionable aspiration of the right upper lobe. Sepsis workup in progress.  Upon arrival to ICU he is awake with return to baseline neuro status.  High risk for intubation.  SIGNIFICANT EVENTS  10/26- Admission to [place] unit 10/26- Neurology consulted 10/27- Unresponsive, required transfer to ICU 10/27- Upon arrival to ICU, back to baseline neuro status 10/27- Concern for aspiration  STUDIES:  10/26- CT Head w/o contrast: 1. Evolving low-density changes within the pons and right cerebellum compatible with known acute/subacute infarcts. No evidence of hemorrhagic conversion. 2. Stable appearance of 3.1 cm right parafalcine meningioma with mild adjacent edema. 10/26- MR Brain: 1. Multiple new acute posterior circulation infarcts. 2. 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 3. Unchanged 3 cm right parietal parafalcine meningioma with mild edema. 4. Mild chronic small vessel ischemic disease. 10/27- CTA Head & Neck: 1. 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 The distal portion remains opacified but diminutive. 2. Right upper lobe opacities likely indicating  aspiration. 3. Unchanged head CT with multiple subacute infarcts and large right parafalcine meningioma. 4. Bilateral carotid bifurcation atherosclerosis without hemodynamically significant stenosis according to NASCET criteria measurements. 5.  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  MRI of the brain revealed multiple new posterior circulation infarcts, and the proximal basilar remained severely stenosed.  Neurology was consulted in the ED who discussed transfer with Neurohospitalist at [facility], who accepted patient but however there were no beds available.  Attempt was made to transfer patient to both [facility] and [facility], however no beds were available.  The following day, he was again evaluated by neurology who had discussed with neurology team at [facility].  Consensus was made that the patient was not a candidate for any acute intervention, and therefore was admitted to telemetry unit here at [facility].  Late in the evening on 01/09/2019 he became unresponsive requiring transfer to ICU.  Follow-up CTA head and neck was unchanged from previous CT. CTA head neck was concerning for possible aspiration.  Upon arrival to the ICU he is awake and back to previous  Neuro status.  He is noted to be tachycardic and febrile, therefore he was placed on empiric antibiotics for aspiration coverage along with sepsis work-up.  He is currently maintaining his airway, however is high risk for intubation.  PCCM is consulted for further  management.  PAST  MEDICAL HISTORY :   has a past medical history of Hypertension.  has no past surgical history on file. Prior to Admission medications   Medication Sig Start Date End Date Taking? Authorizing Provider  acetaminophen (TYLENOL) 325 MG tablet Take 650 mg by mouth every 6 (six) hours as needed.   Yes [provider]  amLODipine (NORVASC) 5 MG tablet Take 1 tablet (5 mg total) by mouth daily. 01/05/19  Yes Mayo, Pete Pelt, MD  aspirin 81 MG chewable tablet Chew 1 tablet (81 mg total) by mouth daily. 01/05/19  Yes Mayo, Pete Pelt, MD  atorvastatin (LIPITOR) 40 MG tablet Take 1 tablet (40 mg total) by mouth daily at 6 PM. 01/04/19  Yes Mayo, Pete Pelt, MD  clopidogrel (PLAVIX) 75 MG tablet Take 1 tablet (75 mg total) by mouth daily. 01/05/19  Yes Mayo, Pete Pelt, MD  hydrochlorothiazide (HYDRODIURIL) 25 MG tablet Take 1 tablet (25 mg total) by mouth daily. 01/05/19  Yes Mayo, Pete Pelt, MD  lisinopril (ZESTRIL) 20 MG tablet Take 1 tablet (20 mg total) by mouth daily. 01/05/19  Yes Mayo, Pete Pelt, MD  metoprolol tartrate (LOPRESSOR) 25 MG tablet Take 1 tablet (25 mg total) by mouth 2 (two) times daily. 01/04/19  Yes Mayo, Pete Pelt, MD  senna-docusate (SENOKOT-S) 8.6-50 MG tablet Take 1 tablet by mouth 2 (two) times daily. 01/04/19  Yes Mayo, Pete Pelt, MD   No Known Allergies  FAMILY HISTORY:  family history is not on file. SOCIAL HISTORY:  reports that he has been smoking cigarettes. He has been smoking about 1.00 pack per day. He has never used smokeless tobacco. He reports current alcohol use of about 1.0 standard drinks of alcohol per week. He reports current drug use. Drug: Cocaine.   COVID-19 DISASTER DECLARATION:  FULL CONTACT PHYSICAL EXAMINATION WAS NOT POSSIBLE DUE TO TREATMENT OF COVID-19 AND  CONSERVATION OF PERSONAL PROTECTIVE EQUIPMENT, LIMITED EXAM FINDINGS INCLUDE-  Patient assessed or the symptoms described in the history of present illness.  In  the context of the Global COVID-19 pandemic, which necessitated consideration that the patient might be at risk for infection with the SARS-CoV-2 virus that causes COVID-19, Institutional protocols and algorithms that pertain to the evaluation of patients at risk for COVID-19 are in a state of rapid change based on information released by regulatory bodies including the CDC and federal and state organizations. These policies and algorithms were followed during the patient's care while in hospital.  REVIEW OF SYSTEMS:   Unable to assess due to CVA and critical illness  SUBJECTIVE:  Unable to assess due to CVA and critical illness  VITAL SIGNS: Temp:  [98.6 F (37 C)-101.7 F (38.7 C)] 101.7 F (38.7 C) (10/27 2300) Pulse Rate:  [78-122] 122 (10/27 2300) Resp:  [14-22] 22 (10/28 0000) BP: (112-159)/(80-105) 159/105 (10/28 0000) SpO2:  [94 %-99 %] 99 % (10/28 0000) Weight:  [77.4 kg] 77.4 kg (10/27 2305)  PHYSICAL EXAMINATION: General: Acutely ill-appearing male, laying in bed, on nasal cannula, no acute distress Neuro: Arouses to voice, follows commands with RUE & RLE, aphasic, nods to questions, pupils PERRLA HEENT: Atraumatic, normocephalic, neck supple, no JVD Cardiovascular: Tachycardia, regular rhythm, S1-S2, no murmurs rubs or gallops, 2+ pulses Lungs: Crackles to bilateral bases, no wheezing, even, nonlabored, normal effort, currently maintaining airway Abdomen: Soft, nontender, nondistended, no guarding or rebound tenderness Musculoskeletal: Left hemiparesis, no deformities, no edema Skin: Warm and diaphoretic (noted to be febrile), no obvious rashes, lesions, or ulcerations  Recent  Labs  Lab 01/04/19 0711 12/15/2018 1353 01/09/19 2007 01/09/19 2239  NA 140 141  --  140  K 3.9 3.6  --  3.3*  CL 102 99  --  105  CO2 27 30  --  24  BUN 18 25*  --  28*  CREATININE 0.97 1.21 0.96 0.94  GLUCOSE 153* 110*  --  92   Recent Labs  Lab 01/02/2019 1353 01/09/19 2239  HGB 17.4*  16.8  HCT 53.7* 53.0*  WBC 7.3 7.0  PLT 353 320   Ct Angio Head W Or Wo Contrast  Result Date: 01/09/2019 CLINICAL DATA:  Left-sided weakness and cephalopathy. EXAM: CT ANGIOGRAPHY HEAD AND NECK TECHNIQUE: Multidetector CT imaging of the head and neck was performed using the standard protocol during bolus administration of intravenous contrast. Multiplanar CT image reconstructions and MIPs were obtained to evaluate the vascular anatomy. Carotid stenosis measurements (when applicable) are obtained utilizing NASCET criteria, using the distal internal carotid diameter as the denominator. CONTRAST:  85mL OMNIPAQUE IOHEXOL 350 MG/ML SOLN COMPARISON:  CTA head neck 01/02/2019 Brain MRI 01/07/2019 FINDINGS: CT HEAD FINDINGS Brain: Unchanged right parafalcine meningioma measuring 2.7 x 4.0 cm. Unchanged mild adjacent edema. There are scattered parenchymal calcifications in an unchanged pattern. There is hypoattenuation of the right pons consistent with known infarcts. No hemorrhage. Skull: The visualized skull base, calvarium and extracranial soft tissues are normal. Sinuses/Orbits: No fluid levels or advanced mucosal thickening of the visualized paranasal sinuses. No mastoid or middle ear effusion. The orbits are normal. CTA NECK FINDINGS SKELETON: There is no bony spinal canal stenosis. No lytic or blastic lesion. OTHER NECK: Normal pharynx, larynx and major salivary glands. No cervical lymphadenopathy. Unremarkable thyroid gland. UPPER CHEST: There are opacities in the right upper lobe likely indicating aspiration. Incidentally noted azygos fissure. AORTIC ARCH: There is mild calcific atherosclerosis of the aortic arch. There is no aneurysm, dissection or hemodynamically significant stenosis of the visualized portion of the aorta. Conventional 3 vessel aortic branching pattern. The visualized proximal subclavian arteries are widely patent. RIGHT CAROTID SYSTEM: There is calcification at the bifurcation and  extending to the proximal ICA without hemodynamically significant stenosis. No dissection or other acute abnormality. LEFT CAROTID SYSTEM: No dissection, occlusion or aneurysm. There is mixed density atherosclerosis extending into the proximal ICA, resulting in less than 50% stenosis. VERTEBRAL ARTERIES: Left dominant configuration. Both origins are clearly patent. There is no dissection, occlusion or flow-limiting stenosis to the skull base (V1-V3 segments). CTA HEAD FINDINGS POSTERIOR CIRCULATION: --Vertebral arteries: Normal V4 segments. --Posterior inferior cerebellar arteries (PICA): Patent origins from the vertebral arteries. --Anterior inferior cerebellar arteries (AICA): Patent origins from the basilar artery. 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 The distal portion remains opacified but diminutive. --Superior cerebellar arteries: Normal. --Posterior cerebral arteries: Normal. The left PCA is predominantly supplied by the posterior communicating artery. ANTERIOR CIRCULATION: --Intracranial internal carotid arteries: Atherosclerotic calcification of the internal carotid arteries at the skull base without hemodynamically significant stenosis. --Anterior cerebral arteries (ACA): Normal. Absent left A1 segment, normal variant --Middle cerebral arteries (MCA): Normal. VENOUS SINUSES: As permitted by contrast timing, patent. ANATOMIC VARIANTS: None Review of the MIP images confirms the above findings. IMPRESSION: 1. 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 The distal portion remains opacified but diminutive. 2. Right upper lobe opacities likely indicating aspiration. 3. Unchanged head CT with multiple subacute infarcts and large right parafalcine meningioma. 4. Bilateral carotid bifurcation  atherosclerosis without hemodynamically significant stenosis according to NASCET criteria measurements. 5.  Aortic  atherosclerosis (ICD10-I70.0). Electronically Signed   By: Ulyses Jarred M.D.   On: 01/09/2019 21:57   Ct Head Wo Contrast  Result Date: 12/30/2018 CLINICAL DATA:  Increased weakness, recent stroke EXAM: CT HEAD WITHOUT CONTRAST TECHNIQUE: Contiguous axial images were obtained from the base of the skull through the vertex without intravenous contrast. COMPARISON:  01/02/2019 FINDINGS: Brain: Stable size and appearance of ovoid hyperattenuating 3.1 x 2.8 x 2.4 cm right parafalcine mass compatible with meningioma. There is mild mass effect and edema within the adjacent white matter. Low-density changes within the pons and right cerebellum compatible with known infarcts. There is no CT evidence of a new infarct. No intracranial hemorrhage. No hydrocephalus. Vascular: Mild atherosclerotic calcifications involving the large vessels of the skull base. Skull: Normal. Negative for fracture or focal lesion. Sinuses/Orbits: No acute finding. Other: None. IMPRESSION: 1. Evolving low-density changes within the pons and right cerebellum compatible with known acute/subacute infarcts. No evidence of hemorrhagic conversion. 2. Stable appearance of 3.1 cm right parafalcine meningioma with mild adjacent edema. Electronically Signed   By: Davina Poke M.D.   On: 12/30/2018 14:14   Ct Angio Neck W Or Wo Contrast  Result Date: 01/09/2019 CLINICAL DATA:  Left-sided weakness and cephalopathy. EXAM: CT ANGIOGRAPHY HEAD AND NECK TECHNIQUE: Multidetector CT imaging of the head and neck was performed using the standard protocol during bolus administration of intravenous contrast. Multiplanar CT image reconstructions and MIPs were obtained to evaluate the vascular anatomy. Carotid stenosis measurements (when applicable) are obtained utilizing NASCET criteria, using the distal internal carotid diameter as the denominator. CONTRAST:  36mL OMNIPAQUE IOHEXOL 350 MG/ML SOLN COMPARISON:  CTA head neck 01/02/2019 Brain MRI 12/31/2018  FINDINGS: CT HEAD FINDINGS Brain: Unchanged right parafalcine meningioma measuring 2.7 x 4.0 cm. Unchanged mild adjacent edema. There are scattered parenchymal calcifications in an unchanged pattern. There is hypoattenuation of the right pons consistent with known infarcts. No hemorrhage. Skull: The visualized skull base, calvarium and extracranial soft tissues are normal. Sinuses/Orbits: No fluid levels or advanced mucosal thickening of the visualized paranasal sinuses. No mastoid or middle ear effusion. The orbits are normal. CTA NECK FINDINGS SKELETON: There is no bony spinal canal stenosis. No lytic or blastic lesion. OTHER NECK: Normal pharynx, larynx and major salivary glands. No cervical lymphadenopathy. Unremarkable thyroid gland. UPPER CHEST: There are opacities in the right upper lobe likely indicating aspiration. Incidentally noted azygos fissure. AORTIC ARCH: There is mild calcific atherosclerosis of the aortic arch. There is no aneurysm, dissection or hemodynamically significant stenosis of the visualized portion of the aorta. Conventional 3 vessel aortic branching pattern. The visualized proximal subclavian arteries are widely patent. RIGHT CAROTID SYSTEM: There is calcification at the bifurcation and extending to the proximal ICA without hemodynamically significant stenosis. No dissection or other acute abnormality. LEFT CAROTID SYSTEM: No dissection, occlusion or aneurysm. There is mixed density atherosclerosis extending into the proximal ICA, resulting in less than 50% stenosis. VERTEBRAL ARTERIES: Left dominant configuration. Both origins are clearly patent. There is no dissection, occlusion or flow-limiting stenosis to the skull base (V1-V3 segments). CTA HEAD FINDINGS POSTERIOR CIRCULATION: --Vertebral arteries: Normal V4 segments. --Posterior inferior cerebellar arteries (PICA): Patent origins from the vertebral arteries. --Anterior inferior cerebellar arteries (AICA): Patent origins from the  basilar artery. 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 The distal portion remains opacified but diminutive. --Superior cerebellar arteries: Normal. --Posterior cerebral arteries:  Normal. The left PCA is predominantly supplied by the posterior communicating artery. ANTERIOR CIRCULATION: --Intracranial internal carotid arteries: Atherosclerotic calcification of the internal carotid arteries at the skull base without hemodynamically significant stenosis. --Anterior cerebral arteries (ACA): Normal. Absent left A1 segment, normal variant --Middle cerebral arteries (MCA): Normal. VENOUS SINUSES: As permitted by contrast timing, patent. ANATOMIC VARIANTS: None Review of the MIP images confirms the above findings. IMPRESSION: 1. 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 The distal portion remains opacified but diminutive. 2. Right upper lobe opacities likely indicating aspiration. 3. Unchanged head CT with multiple subacute infarcts and large right parafalcine meningioma. 4. Bilateral carotid bifurcation atherosclerosis without hemodynamically significant stenosis according to NASCET criteria measurements. 5.  Aortic atherosclerosis (ICD10-I70.0). Electronically Signed   By: Ulyses Jarred M.D.   On: 01/09/2019 21:57   Mr Brain Wo Contrast  Result Date: 01/01/2019 CLINICAL DATA:  Focal neuro deficit greater than 6 hours, stroke suspected. Recent stroke. Increased confusion, slurred speech, and drowsiness. EXAM: MRI HEAD WITHOUT CONTRAST TECHNIQUE: Multiplanar, multiecho pulse sequences of the brain and surrounding structures were obtained without intravenous contrast. COMPARISON:  Head CT 12/19/2018, CTA 01/02/2019, and MRI 01/01/2019 FINDINGS: Brain: There are right cerebellar and pontine infarcts as seen on the recent prior MRI, however there are multiple new acute infarcts in the right cerebellum, right greater than  left/midline pons, midbrain/right cerebral peduncle, right occipital lobe, posterior mesial right temporal lobe, and right thalamus. No intracranial hemorrhage, midline shift, or extra-axial fluid collection is identified. A 3 cm para falcine mass in the right parietal region with mild surrounding edema is unchanged from the prior MRI with mild local mass effect. Small foci of T2 hyperintensity in the cerebral white matter bilaterally are unchanged and nonspecific but compatible with mild chronic small vessel ischemic disease. A small chronic cortical infarct is again noted in the right frontal operculum. 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 Abnormal appearance of the non dominant distal right vertebral artery as well. Mild generalized intracranial arterial dolichoectasia. Skull and upper cervical spine: Unremarkable bone marrow signal. Sinuses/Orbits: Unremarkable orbits. Paranasal sinuses and mastoid air cells are clear. Other: None. IMPRESSION: 1. Multiple new acute posterior circulation infarcts. 2. 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 3. Unchanged 3 cm right parietal parafalcine meningioma with mild edema. 4. Mild chronic small vessel ischemic disease. Electronically Signed   By: Logan Bores M.D.   On: 12/18/2018 18:01   Dg Chest Portable 1 View  Result Date: 12/19/2018 CLINICAL DATA:  Shortness of breath EXAM: PORTABLE CHEST 1 VIEW COMPARISON:  None. FINDINGS: Lungs are clear. Heart size and pulmonary vascularity are normal. No adenopathy. No bone lesions. There is an azygos lobe on the right, an anatomic variant. IMPRESSION: No edema or consolidation.  No evident adenopathy. Electronically Signed   By: Lowella Grip III M.D.   On: 01/10/2019 15:02    ASSESSMENT / PLAN:  Acute CVA Unresponsiveness>>  resolved -ICU monitoring -Frequent neuro checks -Continue aspirin 300 mg rectally -Neurology following, appreciate input -PT/OT/speech evaluations  Acute hypoxic respiratory failure secondary to questionable aspiration -Supplemental O2 as needed to maintain O2 sats greater than 92% -Follow intermittent chest x-ray and ABG as needed -Placed on empiric Unasyn and vancomycin -Follow WBCs and procalcitonin -Follow blood and sputum cultures -Currently maintaining airway, however high risk for  intubation  Mildly elevated troponin, likely demand ischemia -Cardiac monitoring -Maintain MAP greater than 65 -Trend troponin  Hypokalemia -Monitor I&O's / urinary output -Follow BMP -Ensure adequate renal perfusion -Avoid nephrotoxic agents as able -Replace electrolytes as indicated -Replace potassium IV, recheck later this morning       Disposition: ICU Goals of care: Full code VTE prophylaxis: Subcu Lovenox Updates: Updated patient's sister Mallik Rizk via telephone 01/09/2019.    Darel Hong, AGACNP-BC [facility] Pager: [phone]  01/10/2019, 12:16 AM

## 2019-01-10 NOTE — Procedures (Signed)
ELECTROENCEPHALOGRAM REPORT   Patient: Paul Leonard       Room #: IC04A-AA EEG No. ID: [id number] Age: 58 y.o.        Sex: male Referring Physician: Kasa Report Date:  01/10/2019        Interpreting Physician: Alexis Goodell  History: [name] is an 58 y.o. male with altered mental status  Medications:  ASA, Diprovan, Plavix, Zosyn  Conditions of Recording:  This is a 21 channel routine scalp EEG performed with bipolar and monopolar montages arranged in accordance to the international 10/20 system of electrode placement. One channel was dedicated to EKG recording.  The patient is in the intubated and sedated state.  Description:  The background activity consists of a low voltage, fairly well organized mixture of alpha and beta activity.  This activity is diffusely distributed and continuous.  No epileptiform activity is noted.   Hyperventilation and intermittent photic stimulation were not performed.   IMPRESSION: This electroencephalogram is dominated by alpha and beta activity.  Findings consistent with a medication effect.  No epileptiform activity is noted.     Alexis Goodell, MD Neurology [phone] 01/10/2019, 3:54 PM

## 2019-01-10 NOTE — Progress Notes (Addendum)
SLP Cancellation Note  Patient Details Name: Paul Leonard MRN: [id number] DOB: 1960/10/19   Cancelled treatment:       Reason Eval/Treat Not Completed: Patient not medically ready;Medical issues which prohibited therapy;Patient's level of consciousness(chart reviewed; consulted NSG re: pt's status).  Pt was admitted from the ED to CCU; he exhibited decreased responsiveness but was engaging w/ NSG staff as he had in the ED per reports. There was concern for aspiration of his own saliva and secretions d/t poor management and need for frequent oral suctioning by NSG -- pt has been NPO since admitting to the ED, CCU. CTA Head/Neck remains unchanged from prior CT, continues to show multiple subacute infarcts and large right para falcine meningioma. Pt is now orally intubated post decline in mental and Pulmonary status' early this morning and reduced ability to manage own secretions. ST services will sign off w/ MD/NSG to reconsult when appropriate. Recommend frequent oral care for hygiene.    Orinda Kenner, [place], CCC-SLP Addie Alonge 01/10/2019, 9:40 AM

## 2019-01-10 NOTE — Progress Notes (Signed)
Upon reviewing pt's Telemetry, appears to have ST depression. STAT EKG obtained which shows ST depression in Leads II, III, aVF, V5, V6; also with ST elevation in V1 and V2.  These ST segment changes are the same as previous EKG earlier today of which Cardiology reviewed and felt that they were secondary to LVH/Hypertrophic cardiomyopathy (didn't meet STEMI criteria).  High sensitivity troponin upon arrival to ICU is 33 and 41.  Cardiology didn't recommend any further ischemic workup.     Darel Hong, AGACNP-BC [facility] Pager: [phone]

## 2019-01-10 NOTE — Progress Notes (Signed)
OT Cancellation Note  Patient Details Name: Paul Leonard MRN: [id number] DOB: 1960-09-10   Cancelled Treatment:    Reason Eval/Treat Not Completed: Medical issues which prohibited therapy Per chart review, pt intubated and sedated secondary to decline in respiratory status and responsiveness.  Will complete initial order at this time; please re-consult as appropriate.   Gerrianne Scale, MS, OTR/L ascom [phone] or [phone] 01/10/19, 9:14 AM

## 2019-01-10 NOTE — Progress Notes (Signed)
Pt is minimally responsive again.  Given GCS <8 and acute CVA, along with continued secretions requiring suctioning by nursing, will plan to intubate pt for airway protection.  Have called and discussed with pt's sister Seena Zimmerer, and she is in agreement with intubation.  Once airway is stabilized, will repeat Head CT.    Darel Hong, AGACNP-BC [facility] Pager: [phone]

## 2019-01-10 NOTE — Consult Note (Signed)
Paul Darby, MD [address]  [place]  [place], [place] [address]  Main: [phone]  Fax: [phone] Pager: [phone]   Consultation  Referring Provider:     No ref. provider found Primary Care Physician:  Patient, No Pcp Per Primary Gastroenterologist: Althia Forts       Reason for Consultation:     Evaluation for PEG  Date of Admission:  01/06/2019 Date of Consultation:  01/10/2019         HPI:   Paul Leonard is a 58 y.o. 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  He was readmitted yesterday with worsening of left-sided weakness, altered mental status.  Intubated for airway protection due to unresponsiveness and concern for aspiration pneumonia.  Repeat imaging revealed multiple new posterior circulation infarcts.  Patient remains intubated, started on Zosyn.  ENT and GI  are consulted for evaluation of trach and PEG due to expected slow neurologic recovery and rehab placement Patient is minimally responsive Patient currently has OG tube in place, currently not on tube feeds Patient underwent EEG today  NSAIDs: None  Antiplts/Anticoagulants/Anti thrombotics: Aspirin and Plavix  GI Procedures: Unknown  Past Medical History:  Diagnosis Date   Hypertension     History reviewed. No pertinent surgical history.  Prior to Admission medications   Medication Sig Start Date End Date Taking? Authorizing Provider  acetaminophen (TYLENOL) 325 MG tablet Take 650 mg by mouth every 6 (six) hours as needed.   Yes [provider]  amLODipine (NORVASC) 5 MG tablet Take 1 tablet (5 mg total) by mouth daily. 01/05/19  Yes Mayo, Pete Pelt, MD  aspirin 81 MG chewable tablet Chew 1 tablet (81 mg total) by mouth daily. 01/05/19  Yes Mayo, Pete Pelt, MD  atorvastatin (LIPITOR) 40 MG tablet Take 1 tablet (40 mg total) by mouth daily at 6 PM. 01/04/19  Yes Mayo, Pete Pelt, MD  clopidogrel (PLAVIX) 75 MG tablet Take 1 tablet (75 mg total) by mouth daily. 01/05/19  Yes Mayo, Pete Pelt, MD  hydrochlorothiazide (HYDRODIURIL) 25 MG tablet Take 1 tablet (25 mg total) by mouth daily. 01/05/19  Yes Mayo, Pete Pelt, MD  lisinopril (ZESTRIL) 20 MG tablet Take 1 tablet (20 mg total) by mouth daily. 01/05/19  Yes Mayo, Pete Pelt, MD  metoprolol tartrate (LOPRESSOR) 25 MG tablet Take 1 tablet (25 mg total) by mouth 2 (two) times daily. 01/04/19  Yes Mayo, Pete Pelt, MD  senna-docusate (SENOKOT-S) 8.6-50 MG tablet Take 1 tablet by mouth 2 (two) times daily. 01/04/19  Yes Mayo, Pete Pelt, MD    Current Facility-Administered Medications:    0.9 %  sodium chloride infusion, , Intravenous, Continuous, Demetrios Loll, MD, Stopped at 01/10/19 0742   0.9 %  sodium chloride infusion, , Intravenous, PRN, Bradly Bienenstock, NP, Stopped at 01/10/19 1330   acetaminophen (TYLENOL) tablet 650 mg, 650 mg, Oral, Q4H PRN **OR** acetaminophen (TYLENOL) 160 MG/5ML solution 650 mg, 650 mg, Per Tube, Q4H PRN **OR** acetaminophen (TYLENOL) suppository 650 mg, 650 mg, Rectal, Q4H PRN, Demetrios Loll, MD, 650 mg at 01/09/19 2355   [START ON 01/11/2019] aspirin tablet 162.5 mg, 162.5 mg, Per Tube, Daily, Alexis Goodell, MD   chlorhexidine gluconate (MEDLINE KIT) (PERIDEX) 0.12 % solution 15 mL, 15 mL, Mouth Rinse, BID, Flora Lipps, MD, 15  mL at 01/10/19 1111   Chlorhexidine Gluconate Cloth 2 % PADS 6 each, 6 each, Topical, Daily, Darel Hong D, NP, 6 each at 01/10/19 1334   [START ON 01/11/2019] clopidogrel (PLAVIX) tablet 75 mg, 75 mg, Per Tube, Daily, Alexis Goodell, MD   dextrose 50 % solution, , , ,    enoxaparin (LOVENOX) injection 40 mg, 40 mg, Subcutaneous, Q24H, Demetrios Loll, MD, 40 mg at 01/09/19 2010   fentaNYL (SUBLIMAZE) injection 25 mcg, 25 mcg,  Intravenous, Q15 min PRN, Darel Hong D, NP   fentaNYL (SUBLIMAZE) injection 25-100 mcg, 25-100 mcg, Intravenous, Q30 min PRN, Darel Hong D, NP   ipratropium-albuterol (DUONEB) 0.5-2.5 (3) MG/3ML nebulizer solution 3 mL, 3 mL, Nebulization, Q4H PRN, Darel Hong D, NP   labetalol (NORMODYNE) injection 10-20 mg, 10-20 mg, Intravenous, Q2H PRN, Darel Hong D, NP, 10 mg at 01/10/19 0655   [START ON 01/11/2019] MEDLINE mouth rinse, 15 mL, Mouth Rinse, BID, Darel Hong D, NP   MEDLINE mouth rinse, 15 mL, Mouth Rinse, 10 times per day, Flora Lipps, MD, 15 mL at 01/10/19 1732   midazolam (VERSED) injection 2 mg, 2 mg, Intravenous, Q15 min PRN, Darel Hong D, NP   midazolam (VERSED) injection 2 mg, 2 mg, Intravenous, Q2H PRN, Darel Hong D, NP   pantoprazole (PROTONIX) injection 40 mg, 40 mg, Intravenous, Daily, Darel Hong D, NP, 40 mg at 01/10/19 0943   piperacillin-tazobactam (ZOSYN) IVPB 3.375 g, 3.375 g, Intravenous, Q8H, Kasa, Kurian, MD, Last Rate: 12.5 mL/hr at 01/10/19 1500   propofol (DIPRIVAN) 1000 MG/100ML infusion, 5-80 mcg/kg/min, Intravenous, Titrated, Kasa, Kurian, MD, Last Rate: 9.3 mL/hr at 01/10/19 1635, 20.026 mcg/kg/min at 01/10/19 1635   senna-docusate (Senokot-S) tablet 1 tablet, 1 tablet, Oral, QHS PRN, Demetrios Loll, MD  History reviewed. No pertinent family history.   Social History   Tobacco Use   Smoking status: Current Every Day Smoker    Packs/day: 1.00    Types: Cigarettes   Smokeless tobacco: Never Used  Substance Use Topics   Alcohol use: Yes    Alcohol/week: 1.0 standard drinks    Types: 1 Cans of beer per week   Drug use: Yes    Types: Cocaine    Comment: Last used 10/16    Allergies as of 01/07/2019   (No Known Allergies)    Review of Systems:    All systems reviewed and negative except where noted in HPI.   Physical Exam:  Vital signs in last 24 hours: Temp:  [98.5 F (36.9 C)-102.2 F (39 C)] 99 F  (37.2 C) (10/28 1700) Pulse Rate:  [77-122] 85 (10/28 1000) Resp:  [12-27] 16 (10/28 1500) BP: (113-184)/(77-121) 130/87 (10/28 1500) SpO2:  [95 %-100 %] 100 % (10/28 1540) FiO2 (%):  [35 %] 35 % (10/28 1540) Weight:  [77.4 kg] 77.4 kg (10/27 2305) Last BM Date: (unknown) General:   Unresponsive, nonverbal Head:  Normocephalic and atraumatic. Eyes:   No icterus.   Conjunctiva pink. PERRLA. Ears:  Normal auditory acuity. Neck:  Supple; no masses or thyroidomegaly Lungs: Respirations even and unlabored. Lungs clear to auscultation bilaterally.   No wheezes, crackles, or rhonchi.  Heart:  Regular rate and rhythm;  Without murmur, clicks, rubs or gallops Abdomen:  Soft, nondistended, nontender. Normal bowel sounds. No appreciable masses or hepatomegaly.  No rebound or guarding.  Rectal:  Not performed. Msk:  Symmetrical without gross deformities.   Extremities:  Without edema, cyanosis or clubbing. Neurologic: Intubated and sedated Skin:  Intact without  significant lesions or rashes.  LAB RESULTS: CBC Latest Ref Rng & Units 01/09/2019 12/16/2018 01/01/2019  WBC 4.0 - 10.5 K/uL 7.0 7.3 4.5  Hemoglobin 13.0 - 17.0 g/dL 16.8 17.4(H) 15.1  Hematocrit 39.0 - 52.0 % 53.0(H) 53.7(H) 48.0  Platelets 150 - 400 K/uL 320 353 287    BMET BMP Latest Ref Rng & Units 01/10/2019 01/09/2019 01/09/2019  Glucose 70 - 99 mg/dL 95 92 -  BUN 6 - 20 mg/dL 25(H) 28(H) -  Creatinine 0.61 - 1.24 mg/dL 0.86 0.94 0.96  Sodium 135 - 145 mmol/L 142 140 -  Potassium 3.5 - 5.1 mmol/L 3.4(L) 3.3(L) -  Chloride 98 - 111 mmol/L 107 105 -  CO2 22 - 32 mmol/L 23 24 -  Calcium 8.9 - 10.3 mg/dL 8.2(L) 8.4(L) -    LFT Hepatic Function Latest Ref Rng & Units 01/09/2019 01/04/2019 01/01/2019  Total Protein 6.5 - 8.1 g/dL 6.3(L) 7.0 6.7  Albumin 3.5 - 5.0 g/dL 3.3(L) 3.8 3.8  AST 15 - 41 U/L 16 16 15   ALT 0 - 44 U/L 17 21 15   Alk Phosphatase 38 - 126 U/L 58 60 55  Total Bilirubin 0.3 - 1.2 mg/dL 1.4(H) 1.2 0.7      STUDIES: Ct Angio Head W Or Wo Contrast  Result Date: 01/09/2019 CLINICAL DATA:  Left-sided weakness and cephalopathy. EXAM: CT ANGIOGRAPHY HEAD AND NECK TECHNIQUE: Multidetector CT imaging of the head and neck was performed using the standard protocol during bolus administration of intravenous contrast. Multiplanar CT image reconstructions and MIPs were obtained to evaluate the vascular anatomy. Carotid stenosis measurements (when applicable) are obtained utilizing NASCET criteria, using the distal internal carotid diameter as the denominator. CONTRAST:  61m OMNIPAQUE IOHEXOL 350 MG/ML SOLN COMPARISON:  CTA head neck 01/02/2019 Brain MRI 01/13/2019 FINDINGS: CT HEAD FINDINGS Brain: Unchanged right parafalcine meningioma measuring 2.7 x 4.0 cm. Unchanged mild adjacent edema. There are scattered parenchymal calcifications in an unchanged pattern. There is hypoattenuation of the right pons consistent with known infarcts. No hemorrhage. Skull: The visualized skull base, calvarium and extracranial soft tissues are normal. Sinuses/Orbits: No fluid levels or advanced mucosal thickening of the visualized paranasal sinuses. No mastoid or middle ear effusion. The orbits are normal. CTA NECK FINDINGS SKELETON: There is no bony spinal canal stenosis. No lytic or blastic lesion. OTHER NECK: Normal pharynx, larynx and major salivary glands. No cervical lymphadenopathy. Unremarkable thyroid gland. UPPER CHEST: There are opacities in the right upper lobe likely indicating aspiration. Incidentally noted azygos fissure. AORTIC ARCH: There is mild calcific atherosclerosis of the aortic arch. There is no aneurysm, dissection or hemodynamically significant stenosis of the visualized portion of the aorta. Conventional 3 vessel aortic branching pattern. The visualized proximal subclavian arteries are widely patent. RIGHT CAROTID SYSTEM: There is calcification at the bifurcation and extending to the proximal ICA without  hemodynamically significant stenosis. No dissection or other acute abnormality. LEFT CAROTID SYSTEM: No dissection, occlusion or aneurysm. There is mixed density atherosclerosis extending into the proximal ICA, resulting in less than 50% stenosis. VERTEBRAL ARTERIES: Left dominant configuration. Both origins are clearly patent. There is no dissection, occlusion or flow-limiting stenosis to the skull base (V1-V3 segments). CTA HEAD FINDINGS POSTERIOR CIRCULATION: --Vertebral arteries: Normal V4 segments. --Posterior inferior cerebellar arteries (PICA): Patent origins from the vertebral arteries. --Anterior inferior cerebellar arteries (AICA): Patent origins from the basilar artery. 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 The distal portion remains opacified but diminutive. --Superior cerebellar  arteries: Normal. --Posterior cerebral arteries: Normal. The left PCA is predominantly supplied by the posterior communicating artery. ANTERIOR CIRCULATION: --Intracranial internal carotid arteries: Atherosclerotic calcification of the internal carotid arteries at the skull base without hemodynamically significant stenosis. --Anterior cerebral arteries (ACA): Normal. Absent left A1 segment, normal variant --Middle cerebral arteries (MCA): Normal. VENOUS SINUSES: As permitted by contrast timing, patent. ANATOMIC VARIANTS: None Review of the MIP images confirms the above findings. IMPRESSION: 1. 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 The distal portion remains opacified but diminutive. 2. Right upper lobe opacities likely indicating aspiration. 3. Unchanged head CT with multiple subacute infarcts and large right parafalcine meningioma. 4. Bilateral carotid bifurcation atherosclerosis without hemodynamically significant stenosis according to NASCET criteria measurements. 5.  Aortic atherosclerosis (ICD10-I70.0). Electronically  Signed   By: Ulyses Jarred M.D.   On: 01/09/2019 21:57   Dg Abd 1 View  Result Date: 01/10/2019 CLINICAL DATA:  OG tube placement EXAM: ABDOMEN - 1 VIEW COMPARISON:  None. FINDINGS: Enteric tube terminates in the mid gastric body. Nonobstructive bowel gas pattern. Visualized osseous structures are within normal limits. IMPRESSION: Enteric tube terminates in the mid gastric body. Electronically Signed   By: Julian Hy M.D.   On: 01/10/2019 10:13   Ct Head Wo Contrast  Result Date: 01/10/2019 CLINICAL DATA:  CVA EXAM: CT HEAD WITHOUT CONTRAST TECHNIQUE: Contiguous axial images were obtained from the base of the skull through the vertex without intravenous contrast. COMPARISON:  Brain MRI from 2 days ago. Head CT and CTA from yesterday FINDINGS: Brain: Unchanged patchy acute infarct in the right more than left pons, right midbrain, and right cerebellum. 3.5 cm presumed meningioma in the high right parafalcine region. Negative for hemorrhage or hydrocephalus. Vascular: Extensive atherosclerotic calcification. Skull: Negative Sinuses/Orbits: Negative IMPRESSION: 1. Unchanged acute posterior circulation infarcts without hemorrhage. 2. Known presumed meningioma in the high right parafalcine region. Electronically Signed   By: Monte Fantasia M.D.   On: 01/10/2019 08:56   Ct Angio Neck W Or Wo Contrast  Result Date: 01/09/2019 CLINICAL DATA:  Left-sided weakness and cephalopathy. EXAM: CT ANGIOGRAPHY HEAD AND NECK TECHNIQUE: Multidetector CT imaging of the head and neck was performed using the standard protocol during bolus administration of intravenous contrast. Multiplanar CT image reconstructions and MIPs were obtained to evaluate the vascular anatomy. Carotid stenosis measurements (when applicable) are obtained utilizing NASCET criteria, using the distal internal carotid diameter as the denominator. CONTRAST:  61m OMNIPAQUE IOHEXOL 350 MG/ML SOLN COMPARISON:  CTA head neck 01/02/2019 Brain MRI  12/17/2018 FINDINGS: CT HEAD FINDINGS Brain: Unchanged right parafalcine meningioma measuring 2.7 x 4.0 cm. Unchanged mild adjacent edema. There are scattered parenchymal calcifications in an unchanged pattern. There is hypoattenuation of the right pons consistent with known infarcts. No hemorrhage. Skull: The visualized skull base, calvarium and extracranial soft tissues are normal. Sinuses/Orbits: No fluid levels or advanced mucosal thickening of the visualized paranasal sinuses. No mastoid or middle ear effusion. The orbits are normal. CTA NECK FINDINGS SKELETON: There is no bony spinal canal stenosis. No lytic or blastic lesion. OTHER NECK: Normal pharynx, larynx and major salivary glands. No cervical lymphadenopathy. Unremarkable thyroid gland. UPPER CHEST: There are opacities in the right upper lobe likely indicating aspiration. Incidentally noted azygos fissure. AORTIC ARCH: There is mild calcific atherosclerosis of the aortic arch. There is no aneurysm, dissection or hemodynamically significant stenosis of the visualized portion of the aorta. Conventional 3 vessel aortic branching pattern. The visualized proximal subclavian arteries  are widely patent. RIGHT CAROTID SYSTEM: There is calcification at the bifurcation and extending to the proximal ICA without hemodynamically significant stenosis. No dissection or other acute abnormality. LEFT CAROTID SYSTEM: No dissection, occlusion or aneurysm. There is mixed density atherosclerosis extending into the proximal ICA, resulting in less than 50% stenosis. VERTEBRAL ARTERIES: Left dominant configuration. Both origins are clearly patent. There is no dissection, occlusion or flow-limiting stenosis to the skull base (V1-V3 segments). CTA HEAD FINDINGS POSTERIOR CIRCULATION: --Vertebral arteries: Normal V4 segments. --Posterior inferior cerebellar arteries (PICA): Patent origins from the vertebral arteries. --Anterior inferior cerebellar arteries (AICA): Patent origins  from the basilar artery. 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 The distal portion remains opacified but diminutive. --Superior cerebellar arteries: Normal. --Posterior cerebral arteries: Normal. The left PCA is predominantly supplied by the posterior communicating artery. ANTERIOR CIRCULATION: --Intracranial internal carotid arteries: Atherosclerotic calcification of the internal carotid arteries at the skull base without hemodynamically significant stenosis. --Anterior cerebral arteries (ACA): Normal. Absent left A1 segment, normal variant --Middle cerebral arteries (MCA): Normal. VENOUS SINUSES: As permitted by contrast timing, patent. ANATOMIC VARIANTS: None Review of the MIP images confirms the above findings. IMPRESSION: 1. 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 The distal portion remains opacified but diminutive. 2. Right upper lobe opacities likely indicating aspiration. 3. Unchanged head CT with multiple subacute infarcts and large right parafalcine meningioma. 4. Bilateral carotid bifurcation atherosclerosis without hemodynamically significant stenosis according to NASCET criteria measurements. 5.  Aortic atherosclerosis (ICD10-I70.0). Electronically Signed   By: Ulyses Jarred M.D.   On: 01/09/2019 21:57   Dg Chest Port 1 View  Result Date: 01/10/2019 CLINICAL DATA:  Shortness of breath. EXAM: PORTABLE CHEST 1 VIEW COMPARISON:  01/05/2019 FINDINGS: The cardiac silhouette, mediastinal and hilar contours are stable. There is tortuosity and ectasia of the thoracic aorta. The pulmonary hila appear normal. Stable mild eventration right hemidiaphragm. No definite infiltrates, edema or effusions. The bony thorax is intact. IMPRESSION: No acute cardiopulmonary findings. Electronically Signed   By: Marijo Sanes M.D.   On: 01/10/2019 06:46   Dg Chest Port 1 View  Result Date: 01/10/2019 CLINICAL DATA:   Endotracheal tube placement. EXAM: PORTABLE CHEST 1 VIEW COMPARISON:  Earlier film, same date. FINDINGS: The cardiac silhouette, mediastinal and hilar contours are stable. Stable tortuosity and ectasia of the thoracic aorta. The NG tube is stable. New endotracheal tube in good position with its tip 4.2 cm above the carina. IMPRESSION: Endotracheal tube in good position, 4 cm above the carina. Electronically Signed   By: Marijo Sanes M.D.   On: 01/10/2019 06:45   Dg Chest Port 1 View  Result Date: 01/10/2019 CLINICAL DATA:  NG tube placement. EXAM: PORTABLE CHEST 1 VIEW COMPARISON:  Earlier film, same date. FINDINGS: The cardiac silhouette, mediastinal and hilar contours are stable. Stable tortuosity and ectasia of the thoracic aorta. The lungs are clear. New NG tube is coursing down the esophagus and into the stomach. The upper abdominal bowel gas pattern is unremarkable. IMPRESSION: No acute cardiopulmonary findings. NG tube tip is in the fundal region of the stomach. Electronically Signed   By: Marijo Sanes M.D.   On: 01/10/2019 06:43      Impression / Plan:   Paul Leonard is a 58 y.o. male with recent diagnosis of meningioma resulting in left-sided weakness, admitted with worsening of weakness and altered mental status secondary to new brain infarcts, currently intubated due to  hypoxic and hypercapnic respiratory failure  Recommend to start tube feeds and see how he tolerates Bowel regimen to treat constipation Per ICU team, family discussions are ongoing towards goals of care Would await input from neurology regarding EEG findings and his brain activity before proceeding with PEG Needs to be off Plavix for 5 days for PEG placement  Thank you for involving me in the care of this patient.  We will follow along with you    LOS: 1 day   Sherri Sear, MD  01/10/2019, 6:05 PM   Note: This dictation was prepared with Dragon dictation along with smaller phrase technology. Any  transcriptional errors that result from this process are unintentional.

## 2019-01-10 NOTE — Consult Note (Addendum)
Consultation Note Date: 01/10/2019   Patient Name: Paul Leonard  DOB: 11/17/1960  MRN: [id number]  Age / Sex: 58 y.o., male  PCP: Patient, No Pcp Per Referring Physician: Sela Hua, MD  Reason for Consultation: Establishing goals of care  HPI/Patient Profile: Paul Leonard  is a 58 y.o. male with a known history of recent CVA and hypertension.  The patient presented to the ED with worsening altered mental status.  The patient has increased left-sided weakness and the difficulty swallowing.  MRI of the brain report multiple new infarcts.   Clinical Assessment and Goals of Care: Patient is resting in bed on ventilator. Sister is at bedside. Spoke with her and one of patient's children [place] via phone. There are 4 children. One had spinal meningitis and is not cognitively capable of making decisions. The oldest daughter Jolly Mango, the family has tried to contact, but cannot reach. There are 2 children making decisions at this time, Sharlee Blew and Vanetta Mulders, with the assistance of the patient's sisters.   They state prior to the admission last Monday, he worked in [employer]. He enjoyed his family, sports, and eating. He liked to joke and laugh, and loved his independence.   Following his recent hospitalization for CVA, his sister states over the weekend while at home, he fell twice on Friday as he did not want help. Saturday and Sunday were better. She states he could use his hand well enough to hold a walker, and dragged his foot. He could speak. He required assistance with ADL's which he did not like as he is very modest.    We discussed his diagnoses, prognosis, GOC, EOL wishes disposition and options.  A detailed discussion was had today regarding advanced directives.  Concepts specific to code status, artifical feeding and hydration, IV antibiotics and rehospitalization were discussed.  The difference  between an aggressive medical intervention path and a comfort care path was discussed.  Values and goals of care important to patient and family were attempted to be elicited.  Discussed limitations of medical interventions to prolong quality of life in some situations and discussed the concept of human mortality.  The ladies were able to articulate the updates they have been given and appear to understand his status.  The ladies state they recognize his QOL will most likely be far less than acceptable. Per conversation, he would not want a trach, PEG, to live at a facility, or to have others providing his care.  Some of the family wants to continue life prolonging care to give him a chance to improve, and some want to shift to comfort and withdraw care. They are speaking amongst themselves about what to do. They are talking to arrange an Heath meeting with palliative by Friday. Will meet with sister again tomorrow.        SUMMARY OF RECOMMENDATIONS   Continue current care.  Will continue to follow.   Prognosis:   Poor     Primary Diagnoses: Present on Admission: . Acute CVA (cerebrovascular accident) (  [place])   I have reviewed the medical record, interviewed the patient and family, and examined the patient. The following aspects are pertinent.  Past Medical History:  Diagnosis Date  . Hypertension    Social History   Socioeconomic History  . Marital status: Single    Spouse name: Not on file  . Number of children: Not on file  . Years of education: Not on file  . Highest education level: Not on file  Occupational History  . Not on file  Social Needs  . Financial resource strain: Not on file  . Food insecurity    Worry: Not on file    Inability: Not on file  . Transportation needs    Medical: Not on file    Non-medical: Not on file  Tobacco Use  . Smoking status: Current Every Day Smoker    Packs/day: 1.00    Types: Cigarettes  . Smokeless tobacco: Never Used   Substance and Sexual Activity  . Alcohol use: Yes    Alcohol/week: 1.0 standard drinks    Types: 1 Cans of beer per week  . Drug use: Yes    Types: Cocaine    Comment: Last used 10/16  . Sexual activity: Not on file  Lifestyle  . Physical activity    Days per week: Not on file    Minutes per session: Not on file  . Stress: Not on file  Relationships  . Social Herbalist on phone: Not on file    Gets together: Not on file    Attends religious service: Not on file    Active member of club or organization: Not on file    Attends meetings of clubs or organizations: Not on file    Relationship status: Not on file  Other Topics Concern  . Not on file  Social History Narrative  . Not on file   History reviewed. No pertinent family history. Scheduled Meds: . [START ON 01/11/2019] aspirin  162.5 mg Per Tube Daily  . chlorhexidine gluconate (MEDLINE KIT)  15 mL Mouth Rinse BID  . Chlorhexidine Gluconate Cloth  6 each Topical Daily  . [START ON 01/11/2019] clopidogrel  75 mg Per Tube Daily  . enoxaparin (LOVENOX) injection  40 mg Subcutaneous Q24H  . [START ON 01/11/2019] mouth rinse  15 mL Mouth Rinse BID  . mouth rinse  15 mL Mouth Rinse 10 times per day  . pantoprazole (PROTONIX) IV  40 mg Intravenous Daily   Continuous Infusions: . sodium chloride Stopped (01/10/19 0742)  . sodium chloride 10 mL/hr at 01/10/19 1300  . piperacillin-tazobactam (ZOSYN)  IV 3.375 g (01/10/19 1330)  . propofol (DIPRIVAN) infusion 20 mcg/kg/min (01/10/19 1300)   PRN Meds:.sodium chloride, acetaminophen **OR** acetaminophen (TYLENOL) oral liquid 160 mg/5 mL **OR** acetaminophen, fentaNYL (SUBLIMAZE) injection, fentaNYL (SUBLIMAZE) injection, ipratropium-albuterol, labetalol, midazolam, midazolam, senna-docusate Medications Prior to Admission:  Prior to Admission medications   Medication Sig Start Date End Date Taking? Authorizing Provider  acetaminophen (TYLENOL) 325 MG tablet Take 650 mg  by mouth every 6 (six) hours as needed.   Yes [provider]  amLODipine (NORVASC) 5 MG tablet Take 1 tablet (5 mg total) by mouth daily. 01/05/19  Yes Mayo, Pete Pelt, MD  aspirin 81 MG chewable tablet Chew 1 tablet (81 mg total) by mouth daily. 01/05/19  Yes Mayo, Pete Pelt, MD  atorvastatin (LIPITOR) 40 MG tablet Take 1 tablet (40 mg total) by mouth daily at 6 PM. 01/04/19  Yes  Mayo, Pete Pelt, MD  clopidogrel (PLAVIX) 75 MG tablet Take 1 tablet (75 mg total) by mouth daily. 01/05/19  Yes Mayo, Pete Pelt, MD  hydrochlorothiazide (HYDRODIURIL) 25 MG tablet Take 1 tablet (25 mg total) by mouth daily. 01/05/19  Yes Mayo, Pete Pelt, MD  lisinopril (ZESTRIL) 20 MG tablet Take 1 tablet (20 mg total) by mouth daily. 01/05/19  Yes Mayo, Pete Pelt, MD  metoprolol tartrate (LOPRESSOR) 25 MG tablet Take 1 tablet (25 mg total) by mouth 2 (two) times daily. 01/04/19  Yes Mayo, Pete Pelt, MD  senna-docusate (SENOKOT-S) 8.6-50 MG tablet Take 1 tablet by mouth 2 (two) times daily. 01/04/19  Yes Mayo, Pete Pelt, MD   No Known Allergies Review of Systems  Unable to perform ROS   Physical Exam Constitutional:      Comments: On ventilator.      Vital Signs: BP 125/80   Pulse 85   Temp 98.5 F (36.9 C) (Axillary)   Resp 17   Ht 5' 10"  (1.778 m)   Wt 77.4 kg   SpO2 99%   BMI 24.48 kg/m  Pain Scale: CPOT   Pain Score: Asleep   SpO2: SpO2: 99 % O2 Device:SpO2: 99 % O2 Flow Rate: .O2 Flow Rate (L/min): 2 L/min  IO: Intake/output summary:   Intake/Output Summary (Last 24 hours) at 01/10/2019 1445 Last data filed at 01/10/2019 1330 Gross per 24 hour  Intake 2596.8 ml  Output 790 ml  Net 1806.8 ml    LBM: Last BM Date: (unknown) Baseline Weight: Weight: 83.9 kg Most recent weight: Weight: 77.4 kg     Palliative Assessment/Data:     Time In: 12:18 Time Out: 1:29 Time Total: 70 min Greater than 50%  of this time was spent counseling and coordinating care related to the  above assessment and plan.  Signed by: Asencion Gowda, NP   Please contact Palliative Medicine Team phone at [phone] for questions and concerns.  For individual provider: See Shea Evans

## 2019-01-11 ENCOUNTER — Inpatient Hospital Stay: Payer: Medicaid Other

## 2019-01-11 DIAGNOSIS — I739 Peripheral vascular disease, unspecified: Secondary | ICD-10-CM

## 2019-01-11 LAB — GLUCOSE, CAPILLARY
Glucose-Capillary: 109 mg/dL — ABNORMAL HIGH (ref 70–99)
Glucose-Capillary: 119 mg/dL — ABNORMAL HIGH (ref 70–99)
Glucose-Capillary: 104 mg/dL — ABNORMAL HIGH (ref 70–99)
Glucose-Capillary: 88 mg/dL (ref 70–99)
Glucose-Capillary: 118 mg/dL — ABNORMAL HIGH (ref 70–99)
Glucose-Capillary: 126 mg/dL — ABNORMAL HIGH (ref 70–99)
Glucose-Capillary: 107 mg/dL — ABNORMAL HIGH (ref 70–99)

## 2019-01-11 LAB — CBC
HCT: 49.5 % (ref 39.0–52.0)
Hemoglobin: 15.5 g/dL (ref 13.0–17.0)
MCH: 28.1 pg (ref 26.0–34.0)
MCHC: 31.3 g/dL (ref 30.0–36.0)
MCV: 89.8 fL (ref 80.0–100.0)
Platelets: 288 10*3/uL (ref 150–400)
RBC: 5.51 MIL/uL (ref 4.22–5.81)
RDW: 12.9 % (ref 11.5–15.5)
WBC: 7.2 10*3/uL (ref 4.0–10.5)
nRBC: 0 % (ref 0.0–0.2)

## 2019-01-11 LAB — URINE DRUG SCREEN, QUALITATIVE (ARMC ONLY)
Amphetamines, Ur Screen: NOT DETECTED
Barbiturates, Ur Screen: NOT DETECTED
Benzodiazepine, Ur Scrn: NOT DETECTED
Cannabinoid 50 Ng, Ur ~~LOC~~: NOT DETECTED
Cocaine Metabolite,Ur ~~LOC~~: NOT DETECTED
MDMA (Ecstasy)Ur Screen: NOT DETECTED
Methadone Scn, Ur: NOT DETECTED
Opiate, Ur Screen: NOT DETECTED
Phencyclidine (PCP) Ur S: NOT DETECTED
Tricyclic, Ur Screen: NOT DETECTED

## 2019-01-11 LAB — BASIC METABOLIC PANEL
Anion gap: 7 (ref 5–15)
BUN: 28 mg/dL — ABNORMAL HIGH (ref 6–20)
CO2: 26 mmol/L (ref 22–32)
Calcium: 8.5 mg/dL — ABNORMAL LOW (ref 8.9–10.3)
Chloride: 110 mmol/L (ref 98–111)
Creatinine, Ser: 0.99 mg/dL (ref 0.61–1.24)
GFR calc Af Amer: >60 mL/min (ref >60)
GFR calc non Af Amer: >60 mL/min (ref >60)
Glucose, Bld: 110 mg/dL — ABNORMAL HIGH (ref 70–99)
Potassium: 3.9 mmol/L (ref 3.5–5.1)
Sodium: 143 mmol/L (ref 135–145)

## 2019-01-11 LAB — PROCALCITONIN: Procalcitonin: 0.13 ng/mL

## 2019-01-11 MED ORDER — SODIUM CHLORIDE 0.9 % IV SOLN
3.0000 g | Freq: Four times a day (QID) | INTRAVENOUS | Status: DC
  Administered 2019-01-11 – 2019-01-14 (×13): 3 g via INTRAVENOUS
  Filled 2019-01-11 (×12): qty 3
  Filled 2019-01-11 (×2): qty 8
  Filled 2019-01-11: qty 3
  Filled 2019-01-11 (×2): qty 8

## 2019-01-11 MED ORDER — SCOPOLAMINE 1 MG/3DAYS TD PT72
1.0000 | MEDICATED_PATCH | TRANSDERMAL | Status: DC
  Administered 2019-01-11 – 2019-01-17 (×3): 1.5 mg via TRANSDERMAL
  Filled 2019-01-11 (×3): qty 1

## 2019-01-11 MED ORDER — PRO-STAT SUGAR FREE PO LIQD
30.0000 mL | Freq: Two times a day (BID) | ORAL | Status: DC
  Administered 2019-01-11 – 2019-01-16 (×11): 30 mL

## 2019-01-11 MED ORDER — PANTOPRAZOLE SODIUM 40 MG PO PACK
40.0000 mg | PACK | Freq: Every day | ORAL | Status: DC
  Administered 2019-01-12 – 2019-01-16 (×5): 40 mg
  Filled 2019-01-11 (×5): qty 20

## 2019-01-11 MED ORDER — ATORVASTATIN CALCIUM 20 MG PO TABS
80.0000 mg | ORAL_TABLET | Freq: Every day | ORAL | Status: DC
  Administered 2019-01-11 – 2019-01-15 (×5): 80 mg
  Filled 2019-01-11 (×5): qty 4

## 2019-01-11 MED ORDER — ENOXAPARIN SODIUM 40 MG/0.4ML ~~LOC~~ SOLN
40.0000 mg | SUBCUTANEOUS | Status: DC
  Administered 2019-01-11 – 2019-01-12 (×2): 40 mg via SUBCUTANEOUS
  Filled 2019-01-11 (×2): qty 0.4

## 2019-01-11 MED ORDER — VITAL 1.5 CAL PO LIQD
1000.0000 mL | ORAL | Status: DC
  Administered 2019-01-11 – 2019-01-15 (×5): 1000 mL

## 2019-01-11 MED ORDER — VITAL HIGH PROTEIN PO LIQD: 1000.0000 mL | ORAL | Status: DC

## 2019-01-11 NOTE — Progress Notes (Signed)
RT assisted with patient transport to CT and back to ICU with no complications while patient being ventilated on the Trilogy ventilator.

## 2019-01-11 NOTE — Consult Note (Signed)
Pharmacy Antibiotic Note  Paul Leonard is a 58 y.o. male admitted on 01/09/2019 with aspiration pneumonia.  Patient was on vancomycin and Unasyn on 10/27.   10/29: Due to the lack of culture growth, stable WBCs, and afebrile status, the team elected to de-escalate therapy to Unasyn. Zosyn was discontinued, along with vancomycin d/t MRSA PCR (-).   Plan: -Restart Unasyn 3g IV q6h  -Monitor renal function and s/sx of infections and adverse effects  Height: 5\' 10"  (177.8 cm) Weight: 170 lb 10.2 oz (77.4 kg) IBW/kg (Calculated) : 73  Temp (24hrs), Avg:99 F (37.2 C), Min:98.9 F (37.2 C), Max:99.1 F (37.3 C)  Recent Labs  Lab 12/26/2018 1353 01/09/19 2007 01/09/19 2239 01/10/19 0131 01/11/19 0328  WBC 7.3  --  7.0  --  7.2  CREATININE 1.21 0.96 0.94 0.86 0.99  LATICACIDVEN  --   --  1.5  --   --     Estimated Creatinine Clearance: 83 mL/min (by C-G formula based on SCr of 0.99 mg/dL).    No Known Allergies  Antimicrobials this admission: Vancomycin 10/27>>10/28 Unasyn 10/27>>10/28, 10/29 >> Zosyn 10/28>> 10/29  Microbiology results: 10/27 BCx: pending, no growth <12hr 10/27 MRSA PCR: negative 10/26 SARs-CoV-2: negative  Thank you for allowing pharmacy to be a part of this patient's care.  Raiford Simmonds, PharmD Candidate 01/11/2019 2:17 PM

## 2019-01-11 NOTE — Progress Notes (Signed)
Paul Darby, MD [address]  [place]  [place], [place] [address]  Main: [phone]  Fax: [phone] Pager: [phone]   Subjective: Patient remains intubated on minimal vent settings.  Tolerating tube feeds well, at goal.  His nurse reported having 1 bowel movement today.  Palliative care is on board, family discussions ongoing for goals of care   Objective: Vital signs in last 24 hours: Vitals:   01/11/19 1000 01/11/19 1100 01/11/19 1300 01/11/19 1400  BP: 115/86 98/77 118/78 121/76  Pulse: 89 97    Resp: _0 Temp:      TempSrc:      SpO2: 99% 98%    Weight:      Height:       Weight change:   Intake/Output Summary (Last 24 hours) at 01/11/2019 1644 Last data filed at 01/11/2019 1400 Gross per 24 hour  Intake 677.41 ml  Output 760 ml  Net -82.59 ml     Exam: Heart:: Regular rate and rhythm, S1S2 present or without murmur or extra heart sounds Lungs: normal and clear to auscultation Abdomen: soft, nontender, normal bowel sounds   Lab Results: CBC Latest Ref Rng & Units 01/11/2019 01/09/2019 12/30/2018  WBC 4.0 - 10.5 K/uL 7.2 7.0 7.3  Hemoglobin 13.0 - 17.0 g/dL 15.5 16.8 17.4(H)  Hematocrit 39.0 - 52.0 % 49.5 53.0(H) 53.7(H)  Platelets 150 - 400 K/uL 288 320 353   CMP Latest Ref Rng & Units 01/11/2019 01/10/2019 01/09/2019  Glucose 70 - 99 mg/dL 110(H) 95 92  BUN 6 - 20 mg/dL 28(H) 25(H) 28(H)  Creatinine 0.61 - 1.24 mg/dL 0.99 0.86 0.94  Sodium 135 - 145 mmol/L 143 142 140  Potassium 3.5 - 5.1 mmol/L 3.9 3.4(L) 3.3(L)  Chloride 98 - 111 mmol/L 110 107 105  CO2 22 - 32 mmol/L _1 Calcium 8.9 - 10.3 mg/dL 8.5(L) 8.2(L) 8.4(L)  Total Protein 6.5 - 8.1 g/dL - - 6.3(L)  Total Bilirubin 0.3 - 1.2 mg/dL - - 1.4(H)  Alkaline Phos 38 - 126 U/L - - 58  AST 15 - 41 U/L - - 16  ALT 0 - 44 U/L - - 17    Micro Results: Recent Results (from the past 240 hour(s))  SARS CORONAVIRUS 2 (TAT 6-24 HRS) Nasopharyngeal Nasopharyngeal  Swab     Status: None   Collection Time: 01/01/19  7:22 PM   Specimen: Nasopharyngeal Swab  Result Value Ref Range Status   SARS Coronavirus 2 NEGATIVE NEGATIVE Final    Comment: (NOTE) SARS-CoV-2 target nucleic acids are NOT DETECTED. The SARS-CoV-2 RNA is generally detectable in upper and lower respiratory specimens during the acute phase of infection. Negative results do not preclude SARS-CoV-2 infection, do not rule out co-infections with other pathogens, and should not be used as the sole basis for treatment or other patient management decisions. Negative results must be combined with clinical observations, patient history, and epidemiological information. The expected result is Negative. Fact Sheet for Patients: SugarRoll.be Fact Sheet for Healthcare Providers: https://www.woods-mathews.com/ This test is not yet approved or cleared by the [place] FDA and  has been authorized for detection and/or diagnosis of SARS-CoV-2 by FDA under an Emergency Use Authorization (EUA). This EUA will remain  in effect (meaning this test can be used) for the duration of the COVID-19 declaration under Section 56 4(b)(1) of the Act, 21 U.S.C. section 360bbb-3(b)(1), unless the authorization is terminated or revoked sooner. Performed at [facility], [place] [address], [place], [place] [address]   SARS Coronavirus 2 by RT PCR (hospital order, performed in [facility] hospital lab) Nasopharyngeal Nasopharyngeal Swab     Status: None   Collection Time: 01/10/2019  1:51 PM   Specimen: Nasopharyngeal Swab  Result Value Ref Range Status   SARS Coronavirus 2 NEGATIVE NEGATIVE Final    Comment: (NOTE) If result is NEGATIVE SARS-CoV-2 target nucleic acids are NOT DETECTED. The SARS-CoV-2 RNA is generally detectable in upper and lower  respiratory specimens during the acute phase of infection. The lowest  concentration of SARS-CoV-2 viral copies this  assay can detect is 250  copies / mL. A negative result does not preclude SARS-CoV-2 infection  and should not be used as the sole basis for treatment or other  patient management decisions.  A negative result may occur with  improper specimen collection / handling, submission of specimen other  than nasopharyngeal swab, presence of viral mutation(s) within the  areas targeted by this assay, and inadequate number of viral copies  (<250 copies / mL). A negative result must be combined with clinical  observations, patient history, and epidemiological information. If result is POSITIVE SARS-CoV-2 target nucleic acids are DETECTED. The SARS-CoV-2 RNA is generally detectable in upper and lower  respiratory specimens dur ing the acute phase of infection.  Positive  results are indicative of active infection with SARS-CoV-2.  Clinical  correlation with patient history and other diagnostic information is  necessary to determine patient infection status.  Positive results do  not rule out bacterial infection or co-infection with other viruses. If result is PRESUMPTIVE POSTIVE SARS-CoV-2 nucleic acids MAY BE PRESENT.   A presumptive positive result was obtained on the submitted specimen  and confirmed on repeat testing.  While 2019 novel coronavirus  (SARS-CoV-2) nucleic acids may be present in the submitted sample  additional confirmatory testing may be necessary for epidemiological  and / or clinical management purposes  to differentiate between  SARS-CoV-2 and other Sarbecovirus currently known to infect humans.  If clinically indicated additional testing with an alternate test  methodology [phone]) is advised. The SARS-CoV-2 RNA is generally  detectable in upper and lower respiratory sp ecimens during the acute  phase of infection. The expected result is Negative. Fact Sheet for Patients:  StrictlyIdeas.no Fact Sheet for Healthcare  Providers: BankingDealers.co.za This test is not yet approved or cleared by the [place] FDA and has been authorized for detection and/or diagnosis of SARS-CoV-2 by FDA under an Emergency Use Authorization (EUA).  This EUA will remain in effect (meaning this test can be used) for the duration of the COVID-19 declaration under Section 564(b)(1) of the Act, 21 U.S.C. section 360bbb-3(b)(1), unless the authorization is terminated or revoked sooner. Performed at [facility], [address] [place], [place] [address]   Culture, blood (Routine X 2) w Reflex to ID Panel     Status: None (Preliminary result)   Collection Time: 01/09/19 10:38 PM   Specimen: BLOOD  Result Value Ref Range Status   Specimen Description BLOOD BLOOD LEFT HAND  Final   Special Requests   Final    BOTTLES DRAWN AEROBIC AND ANAEROBIC Blood Culture adequate volume   Culture   Final    NO GROWTH 2 DAYS Performed at [facility], [address], [place], [place] [address]    Report Status PENDING  Incomplete  Culture, blood (Routine X 2) w Reflex to ID Panel     Status:  None (Preliminary result)   Collection Time: 01/09/19 10:39 PM   Specimen: BLOOD  Result Value Ref Range Status   Specimen Description BLOOD BLOOD LEFT WRIST  Final   Special Requests   Final    BOTTLES DRAWN AEROBIC AND ANAEROBIC Blood Culture adequate volume   Culture   Final    NO GROWTH 2 DAYS Performed at [facility], [address], [place], [place] [address]    Report Status PENDING  Incomplete  MRSA PCR Screening     Status: None   Collection Time: 01/09/19 11:21 PM   Specimen: Nasal Mucosa; Nasopharyngeal  Result Value Ref Range Status   MRSA by PCR NEGATIVE NEGATIVE Final    Comment:        The GeneXpert MRSA Assay (FDA approved for NASAL specimens only), is one component of a comprehensive MRSA colonization surveillance program. It is not intended to diagnose  MRSA infection nor to guide or monitor treatment for MRSA infections. Performed at [facility], [address] [place], [place] [address]   Culture, respiratory (non-expectorated)     Status: None (Preliminary result)   Collection Time: 01/10/19  6:35 AM   Specimen: Tracheal Aspirate; Respiratory  Result Value Ref Range Status   Specimen Description   Final    TRACHEAL ASPIRATE Performed at [facility], [address] [place], [place] [address]    Special Requests   Final    Normal Performed at [facility], [address] [place], [place] [address]    Gram Stain   Final    MODERATE WBC PRESENT, PREDOMINANTLY PMN NO ORGANISMS SEEN    Culture   Final    NO GROWTH < 12 HOURS Performed at [place] [address] [place], [place] [address]    Report Status PENDING  Incomplete   Studies/Results: Ct Angio Head W Or Wo Contrast  Result Date: 01/09/2019 CLINICAL DATA:  Left-sided weakness and cephalopathy. EXAM: CT ANGIOGRAPHY HEAD AND NECK TECHNIQUE: Multidetector CT imaging of the head and neck was performed using the standard protocol during bolus administration of intravenous contrast. Multiplanar CT image reconstructions and MIPs were obtained to evaluate the vascular anatomy. Carotid stenosis measurements (when applicable) are obtained utilizing NASCET criteria, using the distal internal carotid diameter as the denominator. 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 Unchanged mild adjacent edema. There are scattered parenchymal calcifications in an unchanged pattern. There is hypoattenuation of the right pons consistent with known infarcts. No hemorrhage. Skull: The visualized skull base, calvarium and extracranial soft tissues are normal. Sinuses/Orbits: No fluid levels or  advanced mucosal thickening of the visualized paranasal sinuses. No mastoid or middle ear effusion. The orbits are normal. CTA NECK FINDINGS SKELETON: There is no bony spinal canal stenosis. No lytic or blastic lesion. OTHER NECK: Normal pharynx, larynx and major salivary glands. No cervical lymphadenopathy. Unremarkable thyroid gland. UPPER CHEST: There are opacities in the right upper lobe likely indicating aspiration. Incidentally noted azygos fissure. AORTIC ARCH: There is mild calcific atherosclerosis of the aortic arch. There is no aneurysm, dissection or hemodynamically significant stenosis of the visualized portion of the aorta. Conventional 3 vessel aortic branching pattern. The visualized proximal subclavian arteries are widely patent. RIGHT CAROTID SYSTEM: There is calcification at the bifurcation and extending to the proximal ICA without hemodynamically significant stenosis. No dissection or  other acute abnormality. LEFT CAROTID SYSTEM: No dissection, occlusion or aneurysm. There is mixed density atherosclerosis extending into the proximal ICA, resulting in less than 50% stenosis. VERTEBRAL ARTERIES: Left dominant configuration. Both origins are clearly patent. There is no dissection, occlusion or flow-limiting stenosis to the skull base (V1-V3 segments). CTA HEAD FINDINGS POSTERIOR CIRCULATION: --Vertebral arteries: Normal V4 segments. --Posterior inferior cerebellar arteries (PICA): Patent origins from the vertebral arteries. --Anterior inferior cerebellar arteries (AICA): Patent origins from the basilar artery. 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 The distal portion remains opacified but diminutive. --Superior cerebellar arteries: Normal. --Posterior cerebral arteries: Normal. The left PCA is predominantly supplied by the posterior communicating artery. ANTERIOR CIRCULATION: --Intracranial internal carotid arteries: Atherosclerotic  calcification of the internal carotid arteries at the skull base without hemodynamically significant stenosis. --Anterior cerebral arteries (ACA): Normal. Absent left A1 segment, normal variant --Middle cerebral arteries (MCA): Normal. VENOUS SINUSES: As permitted by contrast timing, patent. ANATOMIC VARIANTS: None Review of the MIP images confirms the above findings. IMPRESSION: 1. 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 The distal portion remains opacified but diminutive. 2. Right upper lobe opacities likely indicating aspiration. 3. Unchanged head CT with multiple subacute infarcts and large right parafalcine meningioma. 4. Bilateral carotid bifurcation atherosclerosis without hemodynamically significant stenosis according to NASCET criteria measurements. 5.  Aortic atherosclerosis (ICD10-I70.0). Electronically Signed   By: Ulyses Jarred M.D.   On: 01/09/2019 21:57   Dg Abd 1 View  Result Date: 01/10/2019 CLINICAL DATA:  OG tube placement EXAM: ABDOMEN - 1 VIEW COMPARISON:  None. FINDINGS: Enteric tube terminates in the mid gastric body. Nonobstructive bowel gas pattern. Visualized osseous structures are within normal limits. IMPRESSION: Enteric tube terminates in the mid gastric body. Electronically Signed   By: Julian Hy M.D.   On: 01/10/2019 10:13   Ct Head Wo Contrast  Result Date: 01/11/2019 CLINICAL DATA:  Stroke. Left-sided weakness. Confusion and slurred speech. Multiple recent posterior circulation infarctions EXAM: CT HEAD WITHOUT CONTRAST TECHNIQUE: Contiguous axial images were obtained from the base of the skull through the vertex without intravenous contrast. COMPARISON:  01/10/2019 FINDINGS: Brain: No discernible change. Areas of low-density related to the acute infarctions in the right pons and midbrain, the right cerebellum and the right occipital lobe. No evidence of new acute infarction. No hemorrhage. No change and swelling. No  hydrocephalus. 3.4 cm right parasagittal vertex meningioma is unchanged. Vascular: There is atherosclerotic calcification of the major vessels at the base of the brain. Skull: Negative Sinuses/Orbits: Clear/normal Other: None IMPRESSION: 1. No change since yesterday's study. Areas of low-density related to the acute infarctions in the right pons and midbrain, the right cerebellum and the right occipital lobe. No evidence of new infarction or hemorrhage. No hydrocephalus. 2. 3.4 cm right parasagittal vertex meningioma, unchanged. 3. Atherosclerosis of the major vessels at the base of the brain. Electronically Signed   By: Nelson Chimes M.D.   On: 01/11/2019 13:04   Ct Head Wo Contrast  Result Date: 01/10/2019 CLINICAL DATA:  CVA EXAM: CT HEAD WITHOUT CONTRAST TECHNIQUE: Contiguous axial images were obtained from the base of the skull through the vertex without intravenous contrast. COMPARISON:  Brain MRI from 2 days ago. Head CT and CTA from yesterday FINDINGS: Brain: Unchanged patchy acute infarct in the right more than left pons, right midbrain, and right cerebellum. 3.5 cm presumed meningioma in the high right parafalcine region. Negative for hemorrhage or hydrocephalus.  Vascular: Extensive atherosclerotic calcification. Skull: Negative Sinuses/Orbits: Negative IMPRESSION: 1. Unchanged acute posterior circulation infarcts without hemorrhage. 2. Known presumed meningioma in the high right parafalcine region. Electronically Signed   By: Monte Fantasia M.D.   On: 01/10/2019 08:56   Ct Angio Neck W Or Wo Contrast  Result Date: 01/09/2019 CLINICAL DATA:  Left-sided weakness and cephalopathy. EXAM: CT ANGIOGRAPHY HEAD AND NECK TECHNIQUE: Multidetector CT imaging of the head and neck was performed using the standard protocol during bolus administration of intravenous contrast. Multiplanar CT image reconstructions and MIPs were obtained to evaluate the vascular anatomy. Carotid stenosis measurements (when  applicable) are obtained utilizing NASCET criteria, using the distal internal carotid diameter as the denominator. CONTRAST:  37m OMNIPAQUE IOHEXOL 350 MG/ML SOLN COMPARISON:  CTA head neck 01/02/2019 Brain MRI 01/06/2019 FINDINGS: CT HEAD FINDINGS Brain: Unchanged right parafalcine meningioma measuring 2.7 x 4.0 cm. Unchanged mild adjacent edema. There are scattered parenchymal calcifications in an unchanged pattern. There is hypoattenuation of the right pons consistent with known infarcts. No hemorrhage. Skull: The visualized skull base, calvarium and extracranial soft tissues are normal. Sinuses/Orbits: No fluid levels or advanced mucosal thickening of the visualized paranasal sinuses. No mastoid or middle ear effusion. The orbits are normal. CTA NECK FINDINGS SKELETON: There is no bony spinal canal stenosis. No lytic or blastic lesion. OTHER NECK: Normal pharynx, larynx and major salivary glands. No cervical lymphadenopathy. Unremarkable thyroid gland. UPPER CHEST: There are opacities in the right upper lobe likely indicating aspiration. Incidentally noted azygos fissure. AORTIC ARCH: There is mild calcific atherosclerosis of the aortic arch. There is no aneurysm, dissection or hemodynamically significant stenosis of the visualized portion of the aorta. Conventional 3 vessel aortic branching pattern. The visualized proximal subclavian arteries are widely patent. RIGHT CAROTID SYSTEM: There is calcification at the bifurcation and extending to the proximal ICA without hemodynamically significant stenosis. No dissection or other acute abnormality. LEFT CAROTID SYSTEM: No dissection, occlusion or aneurysm. There is mixed density atherosclerosis extending into the proximal ICA, resulting in less than 50% stenosis. VERTEBRAL ARTERIES: Left dominant configuration. Both origins are clearly patent. There is no dissection, occlusion or flow-limiting stenosis to the skull base (V1-V3 segments). CTA HEAD FINDINGS POSTERIOR  CIRCULATION: --Vertebral arteries: Normal V4 segments. --Posterior inferior cerebellar arteries (PICA): Patent origins from the vertebral arteries. --Anterior inferior cerebellar arteries (AICA): Patent origins from the basilar artery. 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 The distal portion remains opacified but diminutive. --Superior cerebellar arteries: Normal. --Posterior cerebral arteries: Normal. The left PCA is predominantly supplied by the posterior communicating artery. ANTERIOR CIRCULATION: --Intracranial internal carotid arteries: Atherosclerotic calcification of the internal carotid arteries at the skull base without hemodynamically significant stenosis. --Anterior cerebral arteries (ACA): Normal. Absent left A1 segment, normal variant --Middle cerebral arteries (MCA): Normal. VENOUS SINUSES: As permitted by contrast timing, patent. ANATOMIC VARIANTS: None Review of the MIP images confirms the above findings. IMPRESSION: 1. 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 The distal portion remains opacified but diminutive. 2. Right upper lobe opacities likely indicating aspiration. 3. Unchanged head CT with multiple subacute infarcts and large right parafalcine meningioma. 4. Bilateral carotid bifurcation atherosclerosis without hemodynamically significant stenosis according to NASCET criteria measurements. 5.  Aortic atherosclerosis (ICD10-I70.0). Electronically Signed   By: KUlyses JarredM.D.   On: 01/09/2019 21:57   Dg Chest Port 1 View  Result Date: 01/10/2019 CLINICAL DATA:  Shortness of breath. EXAM: PORTABLE CHEST 1  VIEW COMPARISON:  12/23/2018 FINDINGS: The cardiac silhouette, mediastinal and hilar contours are stable. There is tortuosity and ectasia of the thoracic aorta. The pulmonary hila appear normal. Stable mild eventration right hemidiaphragm. No definite infiltrates, edema or effusions. The  bony thorax is intact. IMPRESSION: No acute cardiopulmonary findings. Electronically Signed   By: Marijo Sanes M.D.   On: 01/10/2019 06:46   Dg Chest Port 1 View  Result Date: 01/10/2019 CLINICAL DATA:  Endotracheal tube placement. EXAM: PORTABLE CHEST 1 VIEW COMPARISON:  Earlier film, same date. FINDINGS: The cardiac silhouette, mediastinal and hilar contours are stable. Stable tortuosity and ectasia of the thoracic aorta. The NG tube is stable. New endotracheal tube in good position with its tip 4.2 cm above the carina. IMPRESSION: Endotracheal tube in good position, 4 cm above the carina. Electronically Signed   By: Marijo Sanes M.D.   On: 01/10/2019 06:45   Dg Chest Port 1 View  Result Date: 01/10/2019 CLINICAL DATA:  NG tube placement. EXAM: PORTABLE CHEST 1 VIEW COMPARISON:  Earlier film, same date. FINDINGS: The cardiac silhouette, mediastinal and hilar contours are stable. Stable tortuosity and ectasia of the thoracic aorta. The lungs are clear. New NG tube is coursing down the esophagus and into the stomach. The upper abdominal bowel gas pattern is unremarkable. IMPRESSION: No acute cardiopulmonary findings. NG tube tip is in the fundal region of the stomach. Electronically Signed   By: Marijo Sanes M.D.   On: 01/10/2019 06:43   Medications:  I have reviewed the patient's current medications. Prior to Admission:  Medications Prior to Admission  Medication Sig Dispense Refill Last Dose   acetaminophen (TYLENOL) 325 MG tablet Take 650 mg by mouth every 6 (six) hours as needed.   01/09/2019 at Unknown time   amLODipine (NORVASC) 5 MG tablet Take 1 tablet (5 mg total) by mouth daily. 30 tablet 0 01/11/2019 at unknown   aspirin 81 MG chewable tablet Chew 1 tablet (81 mg total) by mouth daily. 30 tablet 0 12/17/2018 at unknown   atorvastatin (LIPITOR) 40 MG tablet Take 1 tablet (40 mg total) by mouth daily at 6 PM. 30 tablet 0 Past Week at unknown   clopidogrel (PLAVIX) 75 MG tablet  Take 1 tablet (75 mg total) by mouth daily. 21 tablet 0 01/05/2019 at unknown   hydrochlorothiazide (HYDRODIURIL) 25 MG tablet Take 1 tablet (25 mg total) by mouth daily. 30 tablet 0 01/09/2019 at unknown   lisinopril (ZESTRIL) 20 MG tablet Take 1 tablet (20 mg total) by mouth daily. 30 tablet 0 12/23/2018 at unknown   metoprolol tartrate (LOPRESSOR) 25 MG tablet Take 1 tablet (25 mg total) by mouth 2 (two) times daily. 60 tablet 0 12/19/2018 at unknown   senna-docusate (SENOKOT-S) 8.6-50 MG tablet Take 1 tablet by mouth 2 (two) times daily. 60 tablet 0 01/02/2019 at unknown   Scheduled:  aspirin  162.5 mg Per Tube Daily   atorvastatin  80 mg Per Tube q1800   chlorhexidine gluconate (MEDLINE KIT)  15 mL Mouth Rinse BID   Chlorhexidine Gluconate Cloth  6 each Topical Daily   enoxaparin (LOVENOX) injection  40 mg Subcutaneous Q24H   feeding supplement (PRO-STAT SUGAR FREE 64)  30 mL Per Tube BID   mouth rinse  15 mL Mouth Rinse 10 times per day   pantoprazole sodium  40 mg Per Tube Daily   polyethylene glycol  17 g Oral Daily   scopolamine  1 patch Transdermal Q72H   Continuous:  sodium  chloride 10 mL/hr at 01/11/19 1400   ampicillin-sulbactam (UNASYN) IV Stopped (01/11/19 1331)   feeding supplement (VITAL 1.5 CAL) 1,000 mL (01/11/19 1409)   propofol (DIPRIVAN) infusion Stopped (01/11/19 0727)   WPY:KDXIPJ chloride, acetaminophen **OR** acetaminophen (TYLENOL) oral liquid 160 mg/5 mL **OR** acetaminophen, fentaNYL (SUBLIMAZE) injection, ipratropium-albuterol, labetalol, midazolam, midazolam, senna-docusate Anti-infectives (From admission, onward)   Start     Dose/Rate Route Frequency Ordered Stop   01/11/19 1300  Ampicillin-Sulbactam (UNASYN) 3 g in sodium chloride 0.9 % 100 mL IVPB     3 g 200 mL/hr over 30 Minutes Intravenous Every 6 hours 01/11/19 1233     01/10/19 1400  piperacillin-tazobactam (ZOSYN) IVPB 3.375 g  Status:  Discontinued     3.375 g 12.5 mL/hr over  240 Minutes Intravenous Every 8 hours 01/10/19 1038 01/11/19 1233   01/10/19 1200  vancomycin (VANCOCIN) IVPB 1000 mg/200 mL premix  Status:  Discontinued     1,000 mg 200 mL/hr over 60 Minutes Intravenous Every 12 hours 01/09/19 2254 01/10/19 1038   01/09/19 2345  Ampicillin-Sulbactam (UNASYN) 3 g in sodium chloride 0.9 % 100 mL IVPB  Status:  Discontinued     3 g 200 mL/hr over 30 Minutes Intravenous Every 6 hours 01/09/19 2314 01/10/19 1038   01/09/19 2300  ceFEPIme (MAXIPIME) 2 g in sodium chloride 0.9 % 100 mL IVPB  Status:  Discontinued     2 g 200 mL/hr over 30 Minutes Intravenous Every 8 hours 01/09/19 2254 01/09/19 2313   01/09/19 2300  vancomycin (VANCOCIN) 1,750 mg in sodium chloride 0.9 % 500 mL IVPB     1,750 mg 250 mL/hr over 120 Minutes Intravenous  Once 01/09/19 2254 01/10/19 0230     Scheduled Meds:  aspirin  162.5 mg Per Tube Daily   atorvastatin  80 mg Per Tube q1800   chlorhexidine gluconate (MEDLINE KIT)  15 mL Mouth Rinse BID   Chlorhexidine Gluconate Cloth  6 each Topical Daily   enoxaparin (LOVENOX) injection  40 mg Subcutaneous Q24H   feeding supplement (PRO-STAT SUGAR FREE 64)  30 mL Per Tube BID   mouth rinse  15 mL Mouth Rinse 10 times per day   pantoprazole sodium  40 mg Per Tube Daily   polyethylene glycol  17 g Oral Daily   scopolamine  1 patch Transdermal Q72H   Continuous Infusions:  sodium chloride 10 mL/hr at 01/11/19 1400   ampicillin-sulbactam (UNASYN) IV Stopped (01/11/19 1331)   feeding supplement (VITAL 1.5 CAL) 1,000 mL (01/11/19 1409)   propofol (DIPRIVAN) infusion Stopped (01/11/19 0727)   PRN Meds:.sodium chloride, acetaminophen **OR** acetaminophen (TYLENOL) oral liquid 160 mg/5 mL **OR** acetaminophen, fentaNYL (SUBLIMAZE) injection, ipratropium-albuterol, labetalol, midazolam, midazolam, senna-docusate   Assessment: Active Problems:   Acute CVA (cerebrovascular accident) ([place])  Paul Leonard is a 58 y.o. male with  recent diagnosis of meningioma resulting in left-sided weakness, admitted with worsening of weakness and altered mental status secondary to new brain infarcts, currently intubated due to hypoxic and hypercapnic respiratory failure  Plan: Continue tube feeds as tolerated Continue bowel regimen Will wait until family discussion about goals of care before pursuing PEG tube placement, patient needs to be off Plavix for 3 to 5 days for PEG placement  Discussed with patient's nurse and ICU attending.    LOS: 2 days   Marvia Troost 01/11/2019, 4:44 PM

## 2019-01-11 NOTE — Progress Notes (Signed)
Assisted tele visit to patient with daughter.  Karla Pavone Samson, RN  

## 2019-01-11 NOTE — Progress Notes (Signed)
° °Progress Note ° °Patient Name: Paul Leonard °Date of Encounter: 01/11/2019 ° °Primary Cardiologist: [facility], Dr. 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 ° °Inpatient Medications  °  °Scheduled Meds: °• aspirin  162.5 mg Per Tube Daily  °• atorvastatin  80 mg Per Tube q1800  °• chlorhexidine gluconate (MEDLINE KIT)  15 mL Mouth Rinse BID  °• Chlorhexidine Gluconate Cloth  6 each Topical Daily  °• enoxaparin (LOVENOX) injection  40 mg Subcutaneous Q24H  °• feeding supplement (PRO-STAT SUGAR FREE 64)  30 mL Per Tube BID  °• mouth rinse  15 mL Mouth Rinse 10 times per day  °• pantoprazole sodium  40 mg Per Tube Daily  °• polyethylene glycol  17 g Oral Daily  °• scopolamine  1 patch Transdermal Q72H  ° °Continuous Infusions: °• sodium chloride 10 mL/hr at 01/11/19 1400  °• ampicillin-sulbactam (UNASYN) IV Stopped (01/11/19 1331)  °• feeding supplement (VITAL 1.5 CAL) 1,000 mL (01/11/19 1409)  °• propofol (DIPRIVAN) infusion Stopped (01/11/19 0727)  ° °PRN Meds: °sodium chloride, acetaminophen **OR** acetaminophen (TYLENOL) oral liquid 160 mg/5 mL **OR** acetaminophen, fentaNYL (SUBLIMAZE) injection, ipratropium-albuterol, labetalol, midazolam, midazolam, senna-docusate  ° °Vital Signs  °  °Vitals:  ° 01/11/19 1000 01/11/19 1100 01/11/19 1300 01/11/19 1400  °BP: 115/86 98/77 118/78 121/76  °Pulse: 89 97    °Resp: 16 16 16 14  °Temp:      °TempSrc:      °SpO2: 99% 98%    °Weight:      °Height:      ° ° °Intake/Output Summary (Last 24 hours) at 01/11/2019 1803 °Last data filed at 01/11/2019 1400 °Gross per 24 hour  °Intake 613.48 ml  °Output 760 ml  °Net -146.52 ml  ° °Last 3 Weights 01/09/2019 01/09/2019 01/01/2019  °Weight (lbs) 170 lb 10.2 oz 184 lb 15.5 oz 185 lb  °Weight (kg) 77.4 kg 83.9 kg 83.915 kg  °   ° °Telemetry  °  °7 beat run of SVT  noted, otherwise sinus rhythm to sinus tachycardia with ectopy/PACs- Personally Reviewed ° °ECG  °  °No new tracings- Personally Reviewed ° °Physical Exam  ° °GEN:  Intubated and sedated. °Neck:  No JVD. °Cardiac:  Regular rhythm and rate, no murmurs, rubs, or gallops.  °Respiratory: Clear to auscultation bilaterally.  Coarse breath sounds appreciated on intubation. °GI: Soft, nontender, non-distended  °MS: No edema; No deformity. °Neuro:  Nonfocal  °Psych: Normal affect  ° °Labs  °  °High Sensitivity Troponin:   °Recent Labs  °Lab 12/18/2018 °1353 12/23/2018 °1728 01/09/19 °2347 01/10/19 °0131  °TROPONINIHS 8 10 33* 41*  °   ° °Cardiac EnzymesNo results for input(s): TROPONINI in the last 168 hours. No results for input(s): TROPIPOC in the last 168 hours.  ° °Chemistry °Recent Labs  °Lab 12/14/2018 °1353  01/09/19 °2239 01/10/19 °0131 01/11/19 °0328  °NA 141  --  140 142 143  °K 3.6  --  3.3* 3.4* 3.9  °CL 99  --  105 107 110  °CO2 30  --  24 23 26  °GLUCOSE 110*  --  92 95 110*  °BUN 25*  --  28* 25* 28*  °CREATININE 1.21   < > 0.94 0.86 0.99  °CALCIUM 9.2  --  8.4* 8.2* 8.5*  °PROT 7.0  --  6.3*  --   --   °  ALBUMIN 3.8  --  3.3*  --   --   °AST 16  --  16  --   --   °ALT 21  --  17  --   --   °ALKPHOS 60  --  58  --   --   °BILITOT 1.2  --  1.4*  --   --   °GFRNONAA >60   < > >60 >60 >60  °GFRAA >60   < > >60 >60 >60  °ANIONGAP 12  --  11 12 7  ° < > = values in this interval not displayed.  °  ° °Hematology °Recent Labs  °Lab 12/24/2018 °1353 01/09/19 °2239 01/11/19 °0328  °WBC 7.3 7.0 7.2  °RBC 6.12* 5.92* 5.51  °HGB 17.4* 16.8 15.5  °HCT 53.7* 53.0* 49.5  °MCV 87.7 89.5 89.8  °MCH 28.4 28.4 28.1  °MCHC 32.4 31.7 31.3  °RDW 12.9 12.9 12.9  °PLT 353 320 288  ° ° °BNPNo results for input(s): BNP, PROBNP in the last 168 hours.  ° °DDimer No results for input(s): DDIMER in the last 168 hours.  ° °Radiology  °  °Ct Angio Head W Or Wo Contrast ° °Result Date: 01/09/2019 °CLINICAL DATA:  Left-sided weakness and cephalopathy.  EXAM: CT ANGIOGRAPHY HEAD AND NECK TECHNIQUE: Multidetector CT imaging of the head and neck was performed using the standard protocol during bolus administration of intravenous contrast. Multiplanar CT image reconstructions and MIPs were obtained to evaluate the vascular anatomy. Carotid stenosis measurements (when applicable) are obtained utilizing NASCET criteria, using the distal internal carotid diameter as the denominator. CONTRAST:  75mL OMNIPAQUE IOHEXOL 350 MG/ML SOLN COMPARISON:  CTA head neck 01/02/2019 Brain MRI 12/28/2018 FINDINGS: CT HEAD FINDINGS Brain: Unchanged right parafalcine meningioma measuring 2.7 x 4.0 cm. Unchanged mild adjacent edema. There are scattered parenchymal calcifications in an unchanged pattern. There is hypoattenuation of the right pons consistent with known infarcts. No hemorrhage. Skull: The visualized skull base, calvarium and extracranial soft tissues are normal. Sinuses/Orbits: No fluid levels or advanced mucosal thickening of the visualized paranasal sinuses. No mastoid or middle ear effusion. The orbits are normal. CTA NECK FINDINGS SKELETON: There is no bony spinal canal stenosis. No lytic or blastic lesion. OTHER NECK: Normal pharynx, larynx and major salivary glands. No cervical lymphadenopathy. Unremarkable thyroid gland. UPPER CHEST: There are opacities in the right upper lobe likely indicating aspiration. Incidentally noted azygos fissure. AORTIC ARCH: There is mild calcific atherosclerosis of the aortic arch. There is no aneurysm, dissection or hemodynamically significant stenosis of the visualized portion of the aorta. Conventional 3 vessel aortic branching pattern. The visualized proximal subclavian arteries are widely patent. RIGHT CAROTID SYSTEM: There is calcification at the bifurcation and extending to the proximal ICA without hemodynamically significant stenosis. No dissection or other acute abnormality. LEFT CAROTID SYSTEM: No dissection, occlusion or  aneurysm. There is mixed density atherosclerosis extending into the proximal ICA, resulting in less than 50% stenosis. VERTEBRAL ARTERIES: Left dominant configuration. Both origins are clearly patent. There is no dissection, occlusion or flow-limiting stenosis to the skull base (V1-V3 segments). CTA HEAD FINDINGS POSTERIOR CIRCULATION: --Vertebral arteries: Normal V4 segments. --Posterior inferior cerebellar arteries (PICA): Patent origins from the vertebral arteries. --Anterior inferior cerebellar arteries (AICA): Patent origins from the basilar artery. 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 The distal portion remains opacified but diminutive. --Superior cerebellar arteries: Normal. --Posterior cerebral arteries: Normal. The left PCA is predominantly supplied by the posterior communicating   artery. ANTERIOR CIRCULATION: --Intracranial internal carotid arteries: Atherosclerotic calcification of the internal carotid arteries at the skull base without hemodynamically significant stenosis. --Anterior cerebral arteries (ACA): Normal. Absent left A1 segment, normal variant --Middle cerebral arteries (MCA): Normal. VENOUS SINUSES: As permitted by contrast timing, patent. ANATOMIC VARIANTS: None Review of the MIP images confirms the above findings. IMPRESSION: 1. 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 The distal portion remains opacified but diminutive. 2. Right upper lobe opacities likely indicating aspiration. 3. Unchanged head CT with multiple subacute infarcts and large right parafalcine meningioma. 4. Bilateral carotid bifurcation atherosclerosis without hemodynamically significant stenosis according to NASCET criteria measurements. 5.  Aortic atherosclerosis (ICD10-I70.0). Electronically Signed   By: Kevin  Herman M.D.   On: 01/09/2019 21:57  ° °Dg Abd 1 View ° °Result Date: 01/10/2019 °CLINICAL DATA:  OG tube placement  EXAM: ABDOMEN - 1 VIEW COMPARISON:  None. FINDINGS: Enteric tube terminates in the mid gastric body. Nonobstructive bowel gas pattern. Visualized osseous structures are within normal limits. IMPRESSION: Enteric tube terminates in the mid gastric body. Electronically Signed   By: Sriyesh  Krishnan M.D.   On: 01/10/2019 10:13  ° °Ct Head Wo Contrast ° °Result Date: 01/11/2019 °CLINICAL DATA:  Stroke. Left-sided weakness. Confusion and slurred speech. Multiple recent posterior circulation infarctions EXAM: CT HEAD WITHOUT CONTRAST TECHNIQUE: Contiguous axial images were obtained from the base of the skull through the vertex without intravenous contrast. COMPARISON:  01/10/2019 FINDINGS: Brain: No discernible change. Areas of low-density related to the acute infarctions in the right pons and midbrain, the right cerebellum and the right occipital lobe. No evidence of new acute infarction. No hemorrhage. No change and swelling. No hydrocephalus. 3.4 cm right parasagittal vertex meningioma is unchanged. Vascular: There is atherosclerotic calcification of the major vessels at the base of the brain. Skull: Negative Sinuses/Orbits: Clear/normal Other: None IMPRESSION: 1. No change since yesterday's study. Areas of low-density related to the acute infarctions in the right pons and midbrain, the right cerebellum and the right occipital lobe. No evidence of new infarction or hemorrhage. No hydrocephalus. 2. 3.4 cm right parasagittal vertex meningioma, unchanged. 3. Atherosclerosis of the major vessels at the base of the brain. Electronically Signed   By: Mark  Shogry M.D.   On: 01/11/2019 13:04  ° °Ct Head Wo Contrast ° °Result Date: 01/10/2019 °CLINICAL DATA:  CVA EXAM: CT HEAD WITHOUT CONTRAST TECHNIQUE: Contiguous axial images were obtained from the base of the skull through the vertex without intravenous contrast. COMPARISON:  Brain MRI from 2 days ago. Head CT and CTA from yesterday FINDINGS: Brain: Unchanged patchy acute  infarct in the right more than left pons, right midbrain, and right cerebellum. 3.5 cm presumed meningioma in the high right parafalcine region. Negative for hemorrhage or hydrocephalus. Vascular: Extensive atherosclerotic calcification. Skull: Negative Sinuses/Orbits: Negative IMPRESSION: 1. Unchanged acute posterior circulation infarcts without hemorrhage. 2. Known presumed meningioma in the high right parafalcine region. Electronically Signed   By: Jonathon  Watts M.D.   On: 01/10/2019 08:56  ° °Ct Angio Neck W Or Wo Contrast ° °Result Date: 01/09/2019 °CLINICAL DATA:  Left-sided weakness and cephalopathy. EXAM: CT ANGIOGRAPHY HEAD AND NECK TECHNIQUE: Multidetector CT imaging of the head and neck was performed using the standard protocol during bolus administration of intravenous contrast. Multiplanar CT image reconstructions and MIPs were obtained to evaluate the vascular anatomy. Carotid stenosis measurements (when applicable) are obtained utilizing NASCET criteria, using the distal internal carotid diameter as the denominator. CONTRAST:    37m OMNIPAQUE IOHEXOL 350 MG/ML SOLN COMPARISON:  CTA head neck 01/02/2019 Brain MRI 12/14/2018 FINDINGS: CT HEAD FINDINGS Brain: Unchanged right parafalcine meningioma measuring 2.7 x 4.0 cm. Unchanged mild adjacent edema. There are scattered parenchymal calcifications in an unchanged pattern. There is hypoattenuation of the right pons consistent with known infarcts. No hemorrhage. Skull: The visualized skull base, calvarium and extracranial soft tissues are normal. Sinuses/Orbits: No fluid levels or advanced mucosal thickening of the visualized paranasal sinuses. No mastoid or middle ear effusion. The orbits are normal. CTA NECK FINDINGS SKELETON: There is no bony spinal canal stenosis. No lytic or blastic lesion. OTHER NECK: Normal pharynx, larynx and major salivary glands. No cervical lymphadenopathy. Unremarkable thyroid gland. UPPER CHEST: There are opacities in the  right upper lobe likely indicating aspiration. Incidentally noted azygos fissure. AORTIC ARCH: There is mild calcific atherosclerosis of the aortic arch. There is no aneurysm, dissection or hemodynamically significant stenosis of the visualized portion of the aorta. Conventional 3 vessel aortic branching pattern. The visualized proximal subclavian arteries are widely patent. RIGHT CAROTID SYSTEM: There is calcification at the bifurcation and extending to the proximal ICA without hemodynamically significant stenosis. No dissection or other acute abnormality. LEFT CAROTID SYSTEM: No dissection, occlusion or aneurysm. There is mixed density atherosclerosis extending into the proximal ICA, resulting in less than 50% stenosis. VERTEBRAL ARTERIES: Left dominant configuration. Both origins are clearly patent. There is no dissection, occlusion or flow-limiting stenosis to the skull base (V1-V3 segments). CTA HEAD FINDINGS POSTERIOR CIRCULATION: --Vertebral arteries: Normal V4 segments. --Posterior inferior cerebellar arteries (PICA): Patent origins from the vertebral arteries. --Anterior inferior cerebellar arteries (AICA): Patent origins from the basilar artery. 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 The distal portion remains opacified but diminutive. --Superior cerebellar arteries: Normal. --Posterior cerebral arteries: Normal. The left PCA is predominantly supplied by the posterior communicating artery. ANTERIOR CIRCULATION: --Intracranial internal carotid arteries: Atherosclerotic calcification of the internal carotid arteries at the skull base without hemodynamically significant stenosis. --Anterior cerebral arteries (ACA): Normal. Absent left A1 segment, normal variant --Middle cerebral arteries (MCA): Normal. VENOUS SINUSES: As permitted by contrast timing, patent. ANATOMIC VARIANTS: None Review of the MIP images confirms the above findings. IMPRESSION: 1.  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 The distal portion remains opacified but diminutive. 2. Right upper lobe opacities likely indicating aspiration. 3. Unchanged head CT with multiple subacute infarcts and large right parafalcine meningioma. 4. Bilateral carotid bifurcation atherosclerosis without hemodynamically significant stenosis according to NASCET criteria measurements. 5.  Aortic atherosclerosis (ICD10-I70.0). Electronically Signed   By: KUlyses JarredM.D.   On: 01/09/2019 21:57   Dg Chest Port 1 View  Result Date: 01/10/2019 CLINICAL DATA:  Shortness of breath. EXAM: PORTABLE CHEST 1 VIEW COMPARISON:  12/17/2018 FINDINGS: The cardiac silhouette, mediastinal and hilar contours are stable. There is tortuosity and ectasia of the thoracic aorta. The pulmonary hila appear normal. Stable mild eventration right hemidiaphragm. No definite infiltrates, edema or effusions. The bony thorax is intact. IMPRESSION: No acute cardiopulmonary findings. Electronically Signed   By: PMarijo SanesM.D.   On: 01/10/2019 06:46   Dg Chest Port 1 View  Result Date: 01/10/2019 CLINICAL DATA:  Endotracheal tube placement. EXAM: PORTABLE CHEST 1 VIEW COMPARISON:  Earlier film, same date. FINDINGS: The cardiac silhouette, mediastinal and hilar contours are stable. Stable tortuosity and ectasia of the thoracic aorta. The NG tube is stable. New endotracheal tube in good position with its  tip 4.2 cm above the carina. IMPRESSION: Endotracheal tube in good position, 4 cm above the carina. Electronically Signed   By: Marijo Sanes M.D.   On: 01/10/2019 06:45   Dg Chest Port 1 View  Result Date: 01/10/2019 CLINICAL DATA:  NG tube placement. EXAM: PORTABLE CHEST 1 VIEW COMPARISON:  Earlier film, same date. FINDINGS: The cardiac silhouette, mediastinal and hilar contours are stable. Stable tortuosity and ectasia of the thoracic aorta. The lungs are clear. New NG tube is coursing down  the esophagus and into the stomach. The upper abdominal bowel gas pattern is unremarkable. IMPRESSION: No acute cardiopulmonary findings. NG tube tip is in the fundal region of the stomach. Electronically Signed   By: Marijo Sanes M.D.   On: 01/10/2019 06:43    Cardiac Studies   Echo 01/10/2019 1. Left ventricular ejection fraction, by visual estimation, is 60 to 65%. The left ventricle has normal function. Normal left ventricular size. There is moderately increased left ventricular hypertrophy. 2. There is suggestion of more pronounced thickening at the apex, raising the possibility of apical variant hypertrophic cardiomyopathy. 3. Elevated mean left atrial pressure. 4. Left ventricular diastolic Doppler parameters are consistent with pseudonormalization pattern of LV diastolic filling. 5. Global right ventricle has normal systolic function.The right ventricular size is normal. Mildly increased right ventricular wall thickness. 6. Left atrial size was elongated. 7. Right atrial size was normal. 8. Trivial pericardial effusion is present. 9. The mitral valve is normal in structure. Mild mitral valve regurgitation. 10. The tricuspid valve is grossly normal. Tricuspid valve regurgitation is trivial. 11. The aortic valve has an indeterminant number of cusps Aortic valve regurgitation was not visualized by color flow Doppler. Mild to moderate aortic valve sclerosis/calcification without any evidence of aortic stenosis. 12. The pulmonic valve was not well visualized. Pulmonic valve regurgitation is not visualized by color flow Doppler. 13. Normal pulmonary artery systolic pressure. 14. The inferior vena cava is dilated in size with >50% respiratory variability, suggesting right atrial pressure of 8 mmHg.  Patient Profile     58 y.o. male with history of hypertension and recurrent strokes, admitted with altered mental status and concern for STEMI.  Assessment & Plan    Abnormal  EKG --Unable to assess ROS given patient's worsening mental status and intubation.   --EKG reviewed at time of consultation with ST/T changes most consistent with left ventricular hypertrophy and hypertrophic cardiomyopathy.  EKG did not meet STEMI criteria.   --High-sensitivity troponin was negative x2.   --Echo as above. EF 60-65%. --No further ischemic evaluation was recommended at that time.   --Given the concern for atypical variant of HOCM and recurrent strokes, recommendation was for MRI as an outpatient.  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   --Required intubation overnight from 10/27-10/28.  Head CT stable. --Apical hypertrophic cardiomyopathy is a risk factor for cardioembolic events.   --Consider outpatient cardiac MRI to further evaluate and determine need for long-term anticoagulation.   --Per neurology, patient has been restarted on PTA Plavix 75 mg daily, in addition to ASA daily. Continue with statin therapy.  --Continue to monitor on telemetry.   --Patient is followed by neurology.  HTN --Permissive BP control per neurology.  Acute hypoxic and hypercapnic respiratory failure, secondary to aspiration pneumonia --Intubated.  Continue Zosyn.  Per CCM.   For questions or updates, please contact [place] Please  consult [email or web address] for contact info under        Signed, Arvil Chaco, PA-C  01/11/2019, 6:03 PM

## 2019-01-11 NOTE — Progress Notes (Signed)
Subjective: Patient remains intubated.  Just taken off Sedation about an hour before evaluation.    Objective: Current vital signs: BP 98/77   Pulse 97   Temp 98.9 F (37.2 C) (Axillary)   Resp 16   Ht 5' 10"  (1.778 m)   Wt 77.4 kg   SpO2 98%   BMI 24.48 kg/m  Vital signs in last 24 hours: Temp:  [98.9 F (37.2 C)-99.1 F (37.3 C)] 98.9 F (37.2 C) (10/29 0750) Pulse Rate:  [53-103] 97 (10/29 1100) Resp:  [14-21] 16 (10/29 1100) BP: (98-160)/(72-108) 98/77 (10/29 1100) SpO2:  [96 %-100 %] 98 % (10/29 1100) FiO2 (%):  [28 %-35 %] 28 % (10/29 1117)  Intake/Output from previous day: 10/28 0701 - 10/29 0700 In: 518.1 [I.V.:388; NG/GT:30; IV Piggyback:100.1] Out: 200 [Urine:50; Emesis/NG output:150] Intake/Output this shift: Total I/O In: 144.9 [I.V.:49.3; NG/GT:60; IV Piggyback:35.6] Out: 350 [Urine:350] Nutritional status:  Diet Order            Diet NPO time specified  Diet effective now              Neurologic Exam: Mental Status: Patient does not respond to verbal stimuli.  Does not respond to deep sternal rub.  Does not follow commands.  No verbalizations noted.  Cranial Nerves: II: patient does not respond confrontation bilaterally, pupils right 3 mm, left 2 mm,and reactive bilaterally III,IV,VI: Oculocephalic response absent bilaterally. V,VII: corneal reflex present bilaterally  VIII: patient does not respond to verbal stimuli IX,X: gag reflex reduced, XI: trapezius strength unable to test bilaterally XII: tongue strength unable to test Motor: Extremities flaccid throughout.  No spontaneous movement noted.  No purposeful movements noted. Sensory: Does not respond to noxious stimuli in any extremity.  Lab Results: Basic Metabolic Panel: Recent Labs  Lab 12/26/2018 1353 01/09/19 2007 01/09/19 2239 01/10/19 0131 01/11/19 0328  NA 141  --  140 142 143  K 3.6  --  3.3* 3.4* 3.9  CL 99  --  105 107 110  CO2 30  --  24 23 26   GLUCOSE 110*  --  92 95  110*  BUN 25*  --  28* 25* 28*  CREATININE 1.21 0.96 0.94 0.86 0.99  CALCIUM 9.2  --  8.4* 8.2* 8.5*    Liver Function Tests: Recent Labs  Lab 01/05/2019 1353 01/09/19 2239  AST 16 16  ALT 21 17  ALKPHOS 60 58  BILITOT 1.2 1.4*  PROT 7.0 6.3*  ALBUMIN 3.8 3.3*   No results for input(s): LIPASE, AMYLASE in the last 168 hours. No results for input(s): AMMONIA in the last 168 hours.  CBC: Recent Labs  Lab 12/16/2018 1353 01/09/19 2239 01/11/19 0328  WBC 7.3 7.0 7.2  NEUTROABS 4.6  --   --   HGB 17.4* 16.8 15.5  HCT 53.7* 53.0* 49.5  MCV 87.7 89.5 89.8  PLT 353 320 288    Cardiac Enzymes: No results for input(s): CKTOTAL, CKMB, CKMBINDEX, TROPONINI in the last 168 hours.  Lipid Panel: Recent Labs  Lab 01/10/19 0131 01/10/19 0814  CHOL 116  --   TRIG 71 74  HDL 28*  --   CHOLHDL 4.1  --   VLDL 14  --   LDLCALC 74  --     CBG: Recent Labs  Lab 01/10/19 1720 01/10/19 1924 01/11/19 0020 01/11/19 0450 01/11/19 0737  GLUCAP 120* 104* 109* 119* 104*    Microbiology: Results for orders placed or performed during the hospital encounter of 01/06/2019  SARS Coronavirus 2 by RT PCR (hospital order, performed in [facility] hospital lab) Nasopharyngeal Nasopharyngeal Swab     Status: None   Collection Time: 01/07/2019  1:51 PM   Specimen: Nasopharyngeal Swab  Result Value Ref Range Status   SARS Coronavirus 2 NEGATIVE NEGATIVE Final    Comment: (NOTE) If result is NEGATIVE SARS-CoV-2 target nucleic acids are NOT DETECTED. The SARS-CoV-2 RNA is generally detectable in upper and lower  respiratory specimens during the acute phase of infection. The lowest  concentration of SARS-CoV-2 viral copies this assay can detect is 250  copies / mL. A negative result does not preclude SARS-CoV-2 infection  and should not be used as the sole basis for treatment or other  patient management decisions.  A negative result may occur with  improper specimen collection / handling,  submission of specimen other  than nasopharyngeal swab, presence of viral mutation(s) within the  areas targeted by this assay, and inadequate number of viral copies  (<250 copies / mL). A negative result must be combined with clinical  observations, patient history, and epidemiological information. If result is POSITIVE SARS-CoV-2 target nucleic acids are DETECTED. The SARS-CoV-2 RNA is generally detectable in upper and lower  respiratory specimens dur ing the acute phase of infection.  Positive  results are indicative of active infection with SARS-CoV-2.  Clinical  correlation with patient history and other diagnostic information is  necessary to determine patient infection status.  Positive results do  not rule out bacterial infection or co-infection with other viruses. If result is PRESUMPTIVE POSTIVE SARS-CoV-2 nucleic acids MAY BE PRESENT.   A presumptive positive result was obtained on the submitted specimen  and confirmed on repeat testing.  While 2019 novel coronavirus  (SARS-CoV-2) nucleic acids may be present in the submitted sample  additional confirmatory testing may be necessary for epidemiological  and / or clinical management purposes  to differentiate between  SARS-CoV-2 and other Sarbecovirus currently known to infect humans.  If clinically indicated additional testing with an alternate test  methodology [phone]) is advised. The SARS-CoV-2 RNA is generally  detectable in upper and lower respiratory sp ecimens during the acute  phase of infection. The expected result is Negative. Fact Sheet for Patients:  StrictlyIdeas.no Fact Sheet for Healthcare Providers: BankingDealers.co.za This test is not yet approved or cleared by the [place] FDA and has been authorized for detection and/or diagnosis of SARS-CoV-2 by FDA under an Emergency Use Authorization (EUA).  This EUA will remain in effect (meaning this test can be  used) for the duration of the COVID-19 declaration under Section 564(b)(1) of the Act, 21 U.S.C. section 360bbb-3(b)(1), unless the authorization is terminated or revoked sooner. Performed at [facility], [address] [place], [place] [address]   Culture, blood (Routine X 2) w Reflex to ID Panel     Status: None (Preliminary result)   Collection Time: 01/09/19 10:38 PM   Specimen: BLOOD  Result Value Ref Range Status   Specimen Description BLOOD BLOOD LEFT HAND  Final   Special Requests   Final    BOTTLES DRAWN AEROBIC AND ANAEROBIC Blood Culture adequate volume   Culture   Final    NO GROWTH 2 DAYS Performed at [facility], [address], [place], [place] [address]    Report Status PENDING  Incomplete  Culture, blood (Routine X 2) w Reflex to ID Panel     Status: None (Preliminary result)   Collection Time: 01/09/19 10:39 PM  Specimen: BLOOD  Result Value Ref Range Status   Specimen Description BLOOD BLOOD LEFT WRIST  Final   Special Requests   Final    BOTTLES DRAWN AEROBIC AND ANAEROBIC Blood Culture adequate volume   Culture   Final    NO GROWTH 2 DAYS Performed at [facility], [address], [place], [place] [address]    Report Status PENDING  Incomplete  MRSA PCR Screening     Status: None   Collection Time: 01/09/19 11:21 PM   Specimen: Nasal Mucosa; Nasopharyngeal  Result Value Ref Range Status   MRSA by PCR NEGATIVE NEGATIVE Final    Comment:        The GeneXpert MRSA Assay (FDA approved for NASAL specimens only), is one component of a comprehensive MRSA colonization surveillance program. It is not intended to diagnose MRSA infection nor to guide or monitor treatment for MRSA infections. Performed at [facility], [address] [place], [place] [address]   Culture, respiratory (non-expectorated)     Status: None (Preliminary result)   Collection Time: 01/10/19  6:35 AM   Specimen: Tracheal Aspirate;  Respiratory  Result Value Ref Range Status   Specimen Description   Final    TRACHEAL ASPIRATE Performed at [facility], [address], [place], [place] [address]    Special Requests   Final    Normal Performed at [facility], [address] [place], [place] [address]    Gram Stain   Final    MODERATE WBC PRESENT, PREDOMINANTLY PMN NO ORGANISMS SEEN Performed at [facility], [place] [address] [place], [place] [address]    Culture PENDING  Incomplete   Report Status PENDING  Incomplete    Coagulation Studies: Recent Labs    01/06/2019 1353  LABPROT 14.2  INR 1.1    Imaging: Ct Angio Head W Or Wo Contrast  Result Date: 01/09/2019 CLINICAL DATA:  Left-sided weakness and cephalopathy. EXAM: CT ANGIOGRAPHY HEAD AND NECK TECHNIQUE: Multidetector CT imaging of the head and neck was performed using the standard protocol during bolus administration of intravenous contrast. Multiplanar CT image reconstructions and MIPs were obtained to evaluate the vascular anatomy. Carotid stenosis measurements (when applicable) are obtained utilizing NASCET criteria, using the distal internal carotid diameter as the denominator. CONTRAST:  39m OMNIPAQUE IOHEXOL 350 MG/ML SOLN COMPARISON:  CTA head neck 01/02/2019 Brain MRI 12/23/2018 FINDINGS: CT HEAD FINDINGS Brain: Unchanged right parafalcine meningioma measuring 2.7 x 4.0 cm. Unchanged mild adjacent edema. There are scattered parenchymal calcifications in an unchanged pattern. There is hypoattenuation of the right pons consistent with known infarcts. No hemorrhage. Skull: The visualized skull base, calvarium and extracranial soft tissues are normal. Sinuses/Orbits: No fluid levels or advanced mucosal thickening of the visualized paranasal sinuses. No mastoid or middle ear effusion. The orbits are normal. CTA NECK FINDINGS SKELETON: There is no bony spinal canal stenosis. No lytic or blastic lesion. OTHER NECK: Normal pharynx,  larynx and major salivary glands. No cervical lymphadenopathy. Unremarkable thyroid gland. UPPER CHEST: There are opacities in the right upper lobe likely indicating aspiration. Incidentally noted azygos fissure. AORTIC ARCH: There is mild calcific atherosclerosis of the aortic arch. There is no aneurysm, dissection or hemodynamically significant stenosis of the visualized portion of the aorta. Conventional 3 vessel aortic branching pattern. The visualized proximal subclavian arteries are widely patent. RIGHT CAROTID SYSTEM: There is calcification at the bifurcation and extending to the proximal ICA without hemodynamically significant stenosis. No dissection or other acute  abnormality. LEFT CAROTID SYSTEM: No dissection, occlusion or aneurysm. There is mixed density atherosclerosis extending into the proximal ICA, resulting in less than 50% stenosis. VERTEBRAL ARTERIES: Left dominant configuration. Both origins are clearly patent. There is no dissection, occlusion or flow-limiting stenosis to the skull base (V1-V3 segments). CTA HEAD FINDINGS POSTERIOR CIRCULATION: --Vertebral arteries: Normal V4 segments. --Posterior inferior cerebellar arteries (PICA): Patent origins from the vertebral arteries. --Anterior inferior cerebellar arteries (AICA): Patent origins from the basilar artery. 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 The distal portion remains opacified but diminutive. --Superior cerebellar arteries: Normal. --Posterior cerebral arteries: Normal. The left PCA is predominantly supplied by the posterior communicating artery. ANTERIOR CIRCULATION: --Intracranial internal carotid arteries: Atherosclerotic calcification of the internal carotid arteries at the skull base without hemodynamically significant stenosis. --Anterior cerebral arteries (ACA): Normal. Absent left A1 segment, normal variant --Middle cerebral arteries (MCA): Normal. VENOUS SINUSES: As  permitted by contrast timing, patent. ANATOMIC VARIANTS: None Review of the MIP images confirms the above findings. IMPRESSION: 1. 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 The distal portion remains opacified but diminutive. 2. Right upper lobe opacities likely indicating aspiration. 3. Unchanged head CT with multiple subacute infarcts and large right parafalcine meningioma. 4. Bilateral carotid bifurcation atherosclerosis without hemodynamically significant stenosis according to NASCET criteria measurements. 5.  Aortic atherosclerosis (ICD10-I70.0). Electronically Signed   By: Ulyses Jarred M.D.   On: 01/09/2019 21:57   Dg Abd 1 View  Result Date: 01/10/2019 CLINICAL DATA:  OG tube placement EXAM: ABDOMEN - 1 VIEW COMPARISON:  None. FINDINGS: Enteric tube terminates in the mid gastric body. Nonobstructive bowel gas pattern. Visualized osseous structures are within normal limits. IMPRESSION: Enteric tube terminates in the mid gastric body. Electronically Signed   By: Julian Hy M.D.   On: 01/10/2019 10:13   Ct Head Wo Contrast  Result Date: 01/10/2019 CLINICAL DATA:  CVA EXAM: CT HEAD WITHOUT CONTRAST TECHNIQUE: Contiguous axial images were obtained from the base of the skull through the vertex without intravenous contrast. COMPARISON:  Brain MRI from 2 days ago. Head CT and CTA from yesterday FINDINGS: Brain: Unchanged patchy acute infarct in the right more than left pons, right midbrain, and right cerebellum. 3.5 cm presumed meningioma in the high right parafalcine region. Negative for hemorrhage or hydrocephalus. Vascular: Extensive atherosclerotic calcification. Skull: Negative Sinuses/Orbits: Negative IMPRESSION: 1. Unchanged acute posterior circulation infarcts without hemorrhage. 2. Known presumed meningioma in the high right parafalcine region. Electronically Signed   By: Monte Fantasia M.D.   On: 01/10/2019 08:56   Ct Angio Neck W Or Wo  Contrast  Result Date: 01/09/2019 CLINICAL DATA:  Left-sided weakness and cephalopathy. EXAM: CT ANGIOGRAPHY HEAD AND NECK TECHNIQUE: Multidetector CT imaging of the head and neck was performed using the standard protocol during bolus administration of intravenous contrast. Multiplanar CT image reconstructions and MIPs were obtained to evaluate the vascular anatomy. Carotid stenosis measurements (when applicable) are obtained utilizing NASCET criteria, using the distal internal carotid diameter as the denominator. CONTRAST:  42m OMNIPAQUE IOHEXOL 350 MG/ML SOLN COMPARISON:  CTA head neck 01/02/2019 Brain MRI 12/15/2018 FINDINGS: CT HEAD FINDINGS Brain: Unchanged right parafalcine meningioma measuring 2.7 x 4.0 cm. Unchanged mild adjacent edema. There are scattered parenchymal calcifications in an unchanged pattern. There is hypoattenuation of the right pons consistent with known infarcts. No hemorrhage. Skull: The visualized skull base, calvarium and extracranial soft tissues are normal. Sinuses/Orbits: No fluid levels or advanced  mucosal thickening of the visualized paranasal sinuses. No mastoid or middle ear effusion. The orbits are normal. CTA NECK FINDINGS SKELETON: There is no bony spinal canal stenosis. No lytic or blastic lesion. OTHER NECK: Normal pharynx, larynx and major salivary glands. No cervical lymphadenopathy. Unremarkable thyroid gland. UPPER CHEST: There are opacities in the right upper lobe likely indicating aspiration. Incidentally noted azygos fissure. AORTIC ARCH: There is mild calcific atherosclerosis of the aortic arch. There is no aneurysm, dissection or hemodynamically significant stenosis of the visualized portion of the aorta. Conventional 3 vessel aortic branching pattern. The visualized proximal subclavian arteries are widely patent. RIGHT CAROTID SYSTEM: There is calcification at the bifurcation and extending to the proximal ICA without hemodynamically significant stenosis. No  dissection or other acute abnormality. LEFT CAROTID SYSTEM: No dissection, occlusion or aneurysm. There is mixed density atherosclerosis extending into the proximal ICA, resulting in less than 50% stenosis. VERTEBRAL ARTERIES: Left dominant configuration. Both origins are clearly patent. There is no dissection, occlusion or flow-limiting stenosis to the skull base (V1-V3 segments). CTA HEAD FINDINGS POSTERIOR CIRCULATION: --Vertebral arteries: Normal V4 segments. --Posterior inferior cerebellar arteries (PICA): Patent origins from the vertebral arteries. --Anterior inferior cerebellar arteries (AICA): Patent origins from the basilar artery. 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 The distal portion remains opacified but diminutive. --Superior cerebellar arteries: Normal. --Posterior cerebral arteries: Normal. The left PCA is predominantly supplied by the posterior communicating artery. ANTERIOR CIRCULATION: --Intracranial internal carotid arteries: Atherosclerotic calcification of the internal carotid arteries at the skull base without hemodynamically significant stenosis. --Anterior cerebral arteries (ACA): Normal. Absent left A1 segment, normal variant --Middle cerebral arteries (MCA): Normal. VENOUS SINUSES: As permitted by contrast timing, patent. ANATOMIC VARIANTS: None Review of the MIP images confirms the above findings. IMPRESSION: 1. 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 The distal portion remains opacified but diminutive. 2. Right upper lobe opacities likely indicating aspiration. 3. Unchanged head CT with multiple subacute infarcts and large right parafalcine meningioma. 4. Bilateral carotid bifurcation atherosclerosis without hemodynamically significant stenosis according to NASCET criteria measurements. 5.  Aortic atherosclerosis (ICD10-I70.0). Electronically Signed   By: Ulyses Jarred M.D.   On:  01/09/2019 21:57   Dg Chest Port 1 View  Result Date: 01/10/2019 CLINICAL DATA:  Shortness of breath. EXAM: PORTABLE CHEST 1 VIEW COMPARISON:  12/18/2018 FINDINGS: The cardiac silhouette, mediastinal and hilar contours are stable. There is tortuosity and ectasia of the thoracic aorta. The pulmonary hila appear normal. Stable mild eventration right hemidiaphragm. No definite infiltrates, edema or effusions. The bony thorax is intact. IMPRESSION: No acute cardiopulmonary findings. Electronically Signed   By: Marijo Sanes M.D.   On: 01/10/2019 06:46   Dg Chest Port 1 View  Result Date: 01/10/2019 CLINICAL DATA:  Endotracheal tube placement. EXAM: PORTABLE CHEST 1 VIEW COMPARISON:  Earlier film, same date. FINDINGS: The cardiac silhouette, mediastinal and hilar contours are stable. Stable tortuosity and ectasia of the thoracic aorta. The NG tube is stable. New endotracheal tube in good position with its tip 4.2 cm above the carina. IMPRESSION: Endotracheal tube in good position, 4 cm above the carina. Electronically Signed   By: Marijo Sanes M.D.   On: 01/10/2019 06:45   Dg Chest Port 1 View  Result Date: 01/10/2019 CLINICAL DATA:  NG tube placement. EXAM: PORTABLE CHEST 1 VIEW COMPARISON:  Earlier film, same date. FINDINGS: The cardiac silhouette, mediastinal and hilar contours are stable. Stable tortuosity and ectasia  of the thoracic aorta. The lungs are clear. New NG tube is coursing down the esophagus and into the stomach. The upper abdominal bowel gas pattern is unremarkable. IMPRESSION: No acute cardiopulmonary findings. NG tube tip is in the fundal region of the stomach. Electronically Signed   By: Marijo Sanes M.D.   On: 01/10/2019 06:43    Medications:  I have reviewed the patient's current medications. Scheduled: . aspirin  162.5 mg Per Tube Daily  . atorvastatin  80 mg Per Tube q1800  . chlorhexidine gluconate (MEDLINE KIT)  15 mL Mouth Rinse BID  . Chlorhexidine Gluconate Cloth  6  each Topical Daily  . enoxaparin (LOVENOX) injection  40 mg Subcutaneous Q24H  . mouth rinse  15 mL Mouth Rinse 10 times per day  . pantoprazole sodium  40 mg Per Tube Daily  . polyethylene glycol  17 g Oral Daily   Continuous: . sodium chloride Stopped (01/11/19 0738)  . piperacillin-tazobactam (ZOSYN)  IV 12.5 mL/hr at 01/11/19 1100  . 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  Intubated.  Unable to protect airway.  On ASA.  Patient to have trach and PEG.   Patient with unequal pupils today.  Last head CT on yesterday morning was stable.    Recommendations: 1. Repeat head CT without contrast 2. Continue ASA   LOS: 2 days   Alexis Goodell, MD Neurology [phone] 01/11/2019  11:27 AM

## 2019-01-11 NOTE — Progress Notes (Signed)
Pharmacy Electrolyte Monitoring Consult:  Pharmacy consulted to assist in monitoring and replacing electrolytes in this 58 y.o. male admitted on 12/19/2018. Patient currently in the ICU with acute hypoxic and hypercapnic respiratory failure from aspiration with progressive encephalopathy. 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   Labs:  Sodium (mmol/L)  Date Value  01/11/2019 143  10/10/2013 143   Potassium (mmol/L)  Date Value  01/11/2019 3.9  10/10/2013 3.8   Calcium (mg/dL)  Date Value  01/11/2019 8.5 (L)   Calcium, Total (mg/dL)  Date Value  10/10/2013 8.6   Albumin (g/dL)  Date Value  01/09/2019 3.3 (L)    Assessment/Plan: 1. Electrolytes: no replacement warranted. Patient receiving Vital 1.5 at 87mL/hr. Will obatin follow up electrolytes with am labs.   2. Glucose: Patient with schedule glucose checks. Will continue to monitor and add SSI as appropriate.   3. Constipation: Patient with bowel movement today. Will continue to monitor.   Pharmacy will continue to monitor and adjust per consult.  Simpson,Michael L 01/11/2019 4:56 PM

## 2019-01-11 NOTE — Progress Notes (Signed)
CRITICAL CARE NOTE  CC  follow up respiratory failure  SUBJECTIVE Patient remains critically ill Prognosis is guarded Progressive resp failure from CVA Needs trach and peg tube for survival   BP 122/81   Pulse 99   Temp 99.1 F (37.3 C) (Oral)   Resp 16   Ht 5\' 10"  (1.778 m)   Wt 77.4 kg   SpO2 100%   BMI 24.48 kg/m    I/O last 3 completed shifts: In: 2923.8 [I.V.:2278.4; NG/GT:30; IV Piggyback:615.4] Out: 900 [Urine:750; Emesis/NG output:150] No intake/output data recorded.  SpO2: 100 % O2 Flow Rate (L/min): 2 L/min FiO2 (%): 28 %   SIGNIFICANT EVENTS   REVIEW OF SYSTEMS  PATIENT IS UNABLE TO PROVIDE COMPLETE REVIEW OF SYSTEMS DUE TO SEVERE CRITICAL ILLNESS   PHYSICAL EXAMINATION:  GENERAL:critically ill appearing, +resp distress HEAD: Normocephalic, atraumatic.  EYES: Pupils equal, round, reactive to light.  No scleral icterus.  MOUTH: Moist mucosal membrane. NECK: Supple.  PULMONARY: +rhonchi, +wheezing CARDIOVASCULAR: S1 and S2. Regular rate and rhythm. No murmurs, rubs, or gallops.  GASTROINTESTINAL: Soft, nontender, -distended. No masses. Positive bowel sounds. No hepatosplenomegaly.  MUSCULOSKELETAL: No swelling, clubbing, or edema.  NEUROLOGIC: obtunded, GCS<8 SKIN:intact,warm,dry  MEDICATIONS: I have reviewed all medications and confirmed regimen as documented   CULTURE RESULTS   Recent Results (from the past 240 hour(s))  SARS CORONAVIRUS 2 (TAT 6-24 HRS) Nasopharyngeal Nasopharyngeal Swab     Status: None   Collection Time: 01/01/19  7:22 PM   Specimen: Nasopharyngeal Swab  Result Value Ref Range Status   SARS Coronavirus 2 NEGATIVE NEGATIVE Final    Comment: (NOTE) SARS-CoV-2 target nucleic acids are NOT DETECTED. The SARS-CoV-2 RNA is generally detectable in upper and lower respiratory specimens during the acute phase of infection. Negative results do not preclude SARS-CoV-2 infection, do not rule out co-infections with other  pathogens, and should not be used as the sole basis for treatment or other patient management decisions. Negative results must be combined with clinical observations, patient history, and epidemiological information. The expected result is Negative. Fact Sheet for Patients: SugarRoll.be Fact Sheet for Healthcare Providers: https://www.woods-mathews.com/ This test is not yet approved or cleared by the [place] FDA and  has been authorized for detection and/or diagnosis of SARS-CoV-2 by FDA under an Emergency Use Authorization (EUA). This EUA will remain  in effect (meaning this test can be used) for the duration of the COVID-19 declaration under Section 56 4(b)(1) of the Act, 21 U.S.C. section 360bbb-3(b)(1), unless the authorization is terminated or revoked sooner. Performed at [facility], [place] [address] [place], [place] [address]   SARS Coronavirus 2 by RT PCR (hospital order, performed in [facility] hospital lab) Nasopharyngeal Nasopharyngeal Swab     Status: None   Collection Time: 12/14/2018  1:51 PM   Specimen: Nasopharyngeal Swab  Result Value Ref Range Status   SARS Coronavirus 2 NEGATIVE NEGATIVE Final    Comment: (NOTE) If result is NEGATIVE SARS-CoV-2 target nucleic acids are NOT DETECTED. The SARS-CoV-2 RNA is generally detectable in upper and lower  respiratory specimens during the acute phase of infection. The lowest  concentration of SARS-CoV-2 viral copies this assay can detect is 250  copies / mL. A negative result does not preclude SARS-CoV-2 infection  and should not be used as the sole basis for treatment or other  patient management decisions.  A negative result may occur with  improper specimen collection / handling, submission of specimen other  than nasopharyngeal  swab, presence of viral mutation(s) within the  areas targeted by this assay, and inadequate number of viral copies  (<250 copies / mL). A  negative result must be combined with clinical  observations, patient history, and epidemiological information. If result is POSITIVE SARS-CoV-2 target nucleic acids are DETECTED. The SARS-CoV-2 RNA is generally detectable in upper and lower  respiratory specimens dur ing the acute phase of infection.  Positive  results are indicative of active infection with SARS-CoV-2.  Clinical  correlation with patient history and other diagnostic information is  necessary to determine patient infection status.  Positive results do  not rule out bacterial infection or co-infection with other viruses. If result is PRESUMPTIVE POSTIVE SARS-CoV-2 nucleic acids MAY BE PRESENT.   A presumptive positive result was obtained on the submitted specimen  and confirmed on repeat testing.  While 2019 novel coronavirus  (SARS-CoV-2) nucleic acids may be present in the submitted sample  additional confirmatory testing may be necessary for epidemiological  and / or clinical management purposes  to differentiate between  SARS-CoV-2 and other Sarbecovirus currently known to infect humans.  If clinically indicated additional testing with an alternate test  methodology [phone]) is advised. The SARS-CoV-2 RNA is generally  detectable in upper and lower respiratory sp ecimens during the acute  phase of infection. The expected result is Negative. Fact Sheet for Patients:  StrictlyIdeas.no Fact Sheet for Healthcare Providers: BankingDealers.co.za This test is not yet approved or cleared by the [place] FDA and has been authorized for detection and/or diagnosis of SARS-CoV-2 by FDA under an Emergency Use Authorization (EUA).  This EUA will remain in effect (meaning this test can be used) for the duration of the COVID-19 declaration under Section 564(b)(1) of the Act, 21 U.S.C. section 360bbb-3(b)(1), unless the authorization is terminated or revoked sooner. Performed  at [facility], [address] [place], [place] [address]   Culture, blood (Routine X 2) w Reflex to ID Panel     Status: None (Preliminary result)   Collection Time: 01/09/19 10:38 PM   Specimen: BLOOD  Result Value Ref Range Status   Specimen Description BLOOD BLOOD LEFT HAND  Final   Special Requests   Final    BOTTLES DRAWN AEROBIC AND ANAEROBIC Blood Culture adequate volume   Culture   Final    NO GROWTH 2 DAYS Performed at [facility], [address] [place], [place] [address]    Report Status PENDING  Incomplete  Culture, blood (Routine X 2) w Reflex to ID Panel     Status: None (Preliminary result)   Collection Time: 01/09/19 10:39 PM   Specimen: BLOOD  Result Value Ref Range Status   Specimen Description BLOOD BLOOD LEFT WRIST  Final   Special Requests   Final    BOTTLES DRAWN AEROBIC AND ANAEROBIC Blood Culture adequate volume   Culture   Final    NO GROWTH 2 DAYS Performed at [facility], [address] [place], [place] [address]    Report Status PENDING  Incomplete  MRSA PCR Screening     Status: None   Collection Time: 01/09/19 11:21 PM   Specimen: Nasal Mucosa; Nasopharyngeal  Result Value Ref Range Status   MRSA by PCR NEGATIVE NEGATIVE Final    Comment:        The GeneXpert MRSA Assay (FDA approved for NASAL specimens only), is one component of a comprehensive MRSA colonization surveillance program. It is not intended to diagnose MRSA infection nor to  guide or monitor treatment for MRSA infections. Performed at [facility], [address] [place], [place] [address]   Culture, respiratory (non-expectorated)     Status: None (Preliminary result)   Collection Time: 01/10/19  6:35 AM   Specimen: Tracheal Aspirate; Respiratory  Result Value Ref Range Status   Specimen Description   Final    TRACHEAL ASPIRATE Performed at [facility], [address] [place], [place] [address]    Special Requests    Final    Normal Performed at [facility], [address] [place], [place] [address]    Gram Stain   Final    MODERATE WBC PRESENT, PREDOMINANTLY PMN NO ORGANISMS SEEN Performed at [facility], [place] [address], [place], [place] [address]    Culture PENDING  Incomplete   Report Status PENDING  Incomplete         CBC    Component Value Date/Time   WBC 7.2 01/11/2019 0328   RBC 5.51 01/11/2019 0328   HGB 15.5 01/11/2019 0328   HCT 49.5 01/11/2019 0328   PLT 288 01/11/2019 0328   MCV 89.8 01/11/2019 0328   MCH 28.1 01/11/2019 0328   MCHC 31.3 01/11/2019 0328   RDW 12.9 01/11/2019 0328   LYMPHSABS 1.8 01/10/2019 1353   MONOABS 0.8 12/16/2018 1353   EOSABS 0.1 01/07/2019 1353   BASOSABS 0.0 12/29/2018 1353   BMP Latest Ref Rng & Units 01/11/2019 01/10/2019 01/09/2019  Glucose 70 - 99 mg/dL 110(H) 95 92  BUN 6 - 20 mg/dL 28(H) 25(H) 28(H)  Creatinine 0.61 - 1.24 mg/dL 0.99 0.86 0.94  Sodium 135 - 145 mmol/L 143 142 140  Potassium 3.5 - 5.1 mmol/L 3.9 3.4(L) 3.3(L)  Chloride 98 - 111 mmol/L 110 107 105  CO2 22 - 32 mmol/L 26 23 24   Calcium 8.9 - 10.3 mg/dL 8.5(L) 8.2(L) 8.4(L)    IMAGING    Dg Abd 1 View  Result Date: 01/10/2019 CLINICAL DATA:  OG tube placement EXAM: ABDOMEN - 1 VIEW COMPARISON:  None. FINDINGS: Enteric tube terminates in the mid gastric body. Nonobstructive bowel gas pattern. Visualized osseous structures are within normal limits. IMPRESSION: Enteric tube terminates in the mid gastric body. Electronically Signed   By: Julian Hy M.D.   On: 01/10/2019 10:13   Ct Head Wo Contrast  Result Date: 01/10/2019 CLINICAL DATA:  CVA EXAM: CT HEAD WITHOUT CONTRAST TECHNIQUE: Contiguous axial images were obtained from the base of the skull through the vertex without intravenous contrast. COMPARISON:  Brain MRI from 2 days ago. Head CT and CTA from yesterday FINDINGS: Brain: Unchanged patchy acute infarct in the right more than left pons,  right midbrain, and right cerebellum. 3.5 cm presumed meningioma in the high right parafalcine region. Negative for hemorrhage or hydrocephalus. Vascular: Extensive atherosclerotic calcification. Skull: Negative Sinuses/Orbits: Negative IMPRESSION: 1. Unchanged acute posterior circulation infarcts without hemorrhage. 2. Known presumed meningioma in the high right parafalcine region. Electronically Signed   By: Monte Fantasia M.D.   On: 01/10/2019 08:56       Indwelling Urinary Catheter continued, requirement due to   Reason to continue Indwelling Urinary Catheter strict Intake/Output monitoring for hemodynamic instability   Central Line/ continued, requirement due to  Reason to continue Schlater of central venous pressure or other hemodynamic parameters and poor IV access   Ventilator continued, requirement due to severe respiratory failure   Ventilator Sedation RASS 0 to -2      ASSESSMENT AND  PLAN SYNOPSIS   Severe ACUTE Hypoxic and Hypercapnic Respiratory Failure from aspiration pneumonia with severe progressive encephalopathy -continue Full MV support -continue Bronchodilator Therapy -Wean Fio2 and PEEP as tolerated Unable to wean from vent     NEUROLOGY Brain damage from CVA Needs artificial support for survival    CARDIAC ICU monitoring  ID -continue IV abx as prescibed -follow up cultures  GI GI PROPHYLAXIS as indicated  NUTRITIONAL STATUS DIET-->TF's as tolerated Constipation protocol as indicated  ENDO - will use ICU hypoglycemic\Hyperglycemia protocol if indicated   ELECTROLYTES -follow labs as needed -replace as needed -pharmacy consultation and following   DVT/GI PRX ordered TRANSFUSIONS AS NEEDED MONITOR FSBS ASSESS the need for LABS as needed   Critical Care Time devoted to patient care services described in this note is 31 minutes.   Overall, patient is critically ill, prognosis is guarded.    Corrin Parker,  M.D.  [facility]  Medical Director [place] Director [facility] Cardio-Pulmonary Department

## 2019-01-11 NOTE — Progress Notes (Addendum)
Daily Progress Note   Patient Name: Paul Leonard       Date: 01/11/2019 DOB: 1960/07/26  Age: 58 y.o. MRN#: [id number] Attending Physician: Sela Hua, MD Primary Care Physician: Patient, No Pcp Per Admit Date: 12/24/2018  Reason for Consultation/Follow-up: Establishing goals of care  Subjective: Patient is resting in bed on ventilator. Sedation has been stopped. Awaiting repeat head CT. Patient's sister states she has been updated this morning. The family is asking if any intervention is available for him as they were previously told about stenting. Plans for an E link family meeting tomorrow morning. Sister to call with a time between 9:00 and 1:30.      Length of Stay: 2  Current Medications: Scheduled Meds:  . aspirin  162.5 mg Per Tube Daily  . atorvastatin  80 mg Per Tube q1800  . chlorhexidine gluconate (MEDLINE KIT)  15 mL Mouth Rinse BID  . Chlorhexidine Gluconate Cloth  6 each Topical Daily  . enoxaparin (LOVENOX) injection  40 mg Subcutaneous Q24H  . feeding supplement (PRO-STAT SUGAR FREE 64)  30 mL Per Tube BID  . mouth rinse  15 mL Mouth Rinse 10 times per day  . pantoprazole sodium  40 mg Per Tube Daily  . polyethylene glycol  17 g Oral Daily    Continuous Infusions: . sodium chloride Stopped (01/11/19 0738)  . feeding supplement (VITAL 1.5 CAL)    . piperacillin-tazobactam (ZOSYN)  IV 12.5 mL/hr at 01/11/19 1100  . propofol (DIPRIVAN) infusion Stopped (01/11/19 0727)    PRN Meds: sodium chloride, acetaminophen **OR** acetaminophen (TYLENOL) oral liquid 160 mg/5 mL **OR** acetaminophen, fentaNYL (SUBLIMAZE) injection, fentaNYL (SUBLIMAZE) injection, ipratropium-albuterol, labetalol, midazolam, midazolam, senna-docusate  Physical Exam Constitutional:    Comments: Eyes closed. On vent.              Vital Signs: BP 98/77   Pulse 97   Temp 98.9 F (37.2 C) (Axillary)   Resp 16   Ht _0  (1.778 m)   Wt 77.4 kg   SpO2 98%   BMI 24.48 kg/m  SpO2: SpO2: 98 % O2 Device: O2 Device: Ventilator O2 Flow Rate: O2 Flow Rate (L/min): 2 L/min  Intake/output summary:   Intake/Output Summary (Last 24 hours) at 01/11/2019 1215 Last data filed at 01/11/2019 1100 Gross per 24 hour  Intake 496.96 ml  Output 500 ml  Net -3.04 ml   LBM: Last BM Date: 01/11/19 Baseline Weight: Weight: 83.9 kg Most recent weight: Weight: 77.4 kg       Palliative Assessment/Data:      Patient Active Problem List   Diagnosis Date Noted  . Abnormal EKG   . Acute CVA (cerebrovascular accident) [facility]) 01/01/2019    Palliative Care Assessment & Plan    Recommendations/Plan:  Family meeting tomorrow.    Code Status:    Code Status Orders  (From admission, onward)         Start     Ordered   01/09/19 2127  Full code  Continuous     01/09/19 2126        Code Status History    Date Active Date Inactive Code Status Order ID Comments User Context   01/01/2019 2113 01/04/2019 2153 Full Code [id number]  Demetrios Loll, MD Inpatient   Advance Care Planning Activity       Prognosis:  Very poor.    Care plan was discussed with CCM and neurology  Thank you for allowing the Palliative Medicine Team to assist in the care of this patient.   Time In: 11:40 Time Out: 12:30 Total Time 50 min Prolonged Time Billed  no      Greater than 50%  of this time was spent counseling and coordinating care related to the above assessment and plan.  Asencion Gowda, NP  Please contact Palliative Medicine Team phone at [phone] for questions and concerns.

## 2019-01-11 NOTE — Progress Notes (Signed)
[place] [facility] [place] NAME: Paul Leonard    MR#:  [id number]  DATE OF BIRTH:  27-Jan-1961  SUBJECTIVE:   Patient remains intubated this morning. Family is discussing goals of care.  REVIEW OF SYSTEMS:  ROS-unable to obtain, as patient is intubated and sedated  DRUG ALLERGIES:  No Known Allergies VITALS:  Blood pressure 121/76, pulse 97, temperature 98.9 F (37.2 C), temperature source Axillary, resp. rate 14, height 5\' 10"  (1.778 m), weight 77.4 kg, SpO2 98 %. PHYSICAL EXAMINATION:  Physical Exam  GENERAL:  Laying in the bed with no acute distress.  HEENT: Head atraumatic, normocephalic. Pupils equal, round, reactive to light and accommodation. No scleral icterus. Extraocular muscles intact. Oropharynx and nasopharynx clear. ETT in place. NECK:  Supple, no jugular venous distention. No thyroid enlargement. LUNGS: Lungs are clear to auscultation bilaterally. No wheezes, crackles, rhonchi. No use of accessory muscles of respiration.  CARDIOVASCULAR: RRR, S1, S2 normal. No murmurs, rubs, or gallops.  ABDOMEN: Soft, nontender, nondistended. Bowel sounds present.  EXTREMITIES: No pedal edema, cyanosis, or clubbing.  NEUROLOGIC: Unable to assess- intubated and sedated PSYCHIATRIC: Unable to assess SKIN: No obvious rash, lesion, or ulcer.  LABORATORY PANEL:  Male CBC Recent Labs  Lab 01/11/19 0328  WBC 7.2  HGB 15.5  HCT 49.5  PLT 288   ------------------------------------------------------------------------------------------------------------------ Chemistries  Recent Labs  Lab 01/09/19 2239  01/11/19 0328  NA 140   < > 143  K 3.3*   < > 3.9  CL 105   < > 110  CO2 24   < > 26  GLUCOSE 92   < > 110*  BUN 28*   < > 28*  CREATININE 0.94   < > 0.99  CALCIUM 8.4*   < > 8.5*  AST 16  --   --   ALT 17  --   --   ALKPHOS 58  --   --   BILITOT 1.4*  --   --    < > = values in this interval not displayed.   RADIOLOGY:  No  results found. 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 -Cardiology following- consider cardiac MRI as an outpatient to rule out HCM  Hypertension -Permissive hypertension for now -IV labetalol as needed  All the records are reviewed and case discussed with Care Management/Social Worker. Management plans discussed with the patient, family and they are in agreement.  CODE STATUS: Full Code  TOTAL TIME TAKING CARE OF THIS PATIENT: 38 minutes.   More than 50% of the time was spent in counseling/coordination of care: YES  POSSIBLE D/C unknown, DEPENDING ON CLINICAL CONDITION.   Berna Spare Alynah Schone M.D on 01/11/2019 at 3:57 PM  Between 7am to 6pm - Pager - [phone]  After 6pm go to [email or web address] - Proofreader  [facility]  Office  [phone]  CC: Primary care physician; Patient, No Pcp Per  Note: This dictation was  prepared with Dragon dictation along with smaller phrase technology. Any transcriptional errors that result from this process are unintentional.

## 2019-01-12 DIAGNOSIS — J9601 Acute respiratory failure with hypoxia: Secondary | ICD-10-CM

## 2019-01-12 LAB — BASIC METABOLIC PANEL
Anion gap: 8 (ref 5–15)
BUN: 31 mg/dL — ABNORMAL HIGH (ref 6–20)
CO2: 27 mmol/L (ref 22–32)
Calcium: 8.5 mg/dL — ABNORMAL LOW (ref 8.9–10.3)
Chloride: 109 mmol/L (ref 98–111)
Creatinine, Ser: 0.99 mg/dL (ref 0.61–1.24)
GFR calc Af Amer: >60 mL/min (ref >60)
GFR calc non Af Amer: >60 mL/min (ref >60)
Glucose, Bld: 153 mg/dL — ABNORMAL HIGH (ref 70–99)
Potassium: 3.8 mmol/L (ref 3.5–5.1)
Sodium: 144 mmol/L (ref 135–145)

## 2019-01-12 LAB — URINALYSIS, COMPLETE (UACMP) WITH MICROSCOPIC
Bacteria, UA: NONE SEEN
Bilirubin Urine: NEGATIVE
Glucose, UA: NEGATIVE mg/dL
Ketones, ur: NEGATIVE mg/dL
Nitrite: NEGATIVE
Protein, ur: 100 mg/dL — AB
RBC / HPF: >50 RBC/hpf — ABNORMAL HIGH (ref 0–5)
Specific Gravity, Urine: 1.035 — ABNORMAL HIGH (ref 1.005–1.030)
Squamous Epithelial / HPF: NONE SEEN (ref 0–5)
WBC, UA: >50 WBC/hpf — ABNORMAL HIGH (ref 0–5)
pH: 5 (ref 5.0–8.0)

## 2019-01-12 LAB — GLUCOSE, CAPILLARY
Glucose-Capillary: 141 mg/dL — ABNORMAL HIGH (ref 70–99)
Glucose-Capillary: 129 mg/dL — ABNORMAL HIGH (ref 70–99)
Glucose-Capillary: 117 mg/dL — ABNORMAL HIGH (ref 70–99)
Glucose-Capillary: 120 mg/dL — ABNORMAL HIGH (ref 70–99)
Glucose-Capillary: 137 mg/dL — ABNORMAL HIGH (ref 70–99)
Glucose-Capillary: 133 mg/dL — ABNORMAL HIGH (ref 70–99)

## 2019-01-12 LAB — PHOSPHORUS: Phosphorus: 2.7 mg/dL (ref 2.5–4.6)

## 2019-01-12 LAB — MAGNESIUM: Magnesium: 2.3 mg/dL (ref 1.7–2.4)

## 2019-01-12 NOTE — Progress Notes (Addendum)
CRITICAL CARE NOTE  CC  follow up CVA and respiratory failure   SUBJECTIVE Patient remains critically ill Prognosis is guarded Pt remains on a ventilator without sedation  ENT and GI have been consulted regarding trach and PEG placements - waiting on family for decision    BP (!) 137/97   Pulse 79   Temp 99.3 F (37.4 C) (Oral)   Resp 15   Ht 5\' 10"  (1.778 m)   Wt 75.4 kg   SpO2 98%   BMI 23.85 kg/m    I/O last 3 completed shifts: In: 1187.2 [I.V.:387.1; NG/GT:300; IV Piggyback:500.1] Out: [id number] [Urine:1150; Emesis/NG output:110] Total I/O In: 21.3 [I.V.:21.3] Out: -   SpO2: 98 % O2 Flow Rate (L/min): 2 L/min FiO2 (%): 28 %   SIGNIFICANT EVENTS 10/26: Presented to ED with AMS after recent CVA. Found to have ST depression on EKG and new infarcts found on CT. Started on vanc and unasyn for possible aspiration pneumonia. Admitted to telemetry unit 10/27: Transferred to ICU for unresponsiveness 10/28: Intubated 10/29: Pt remains intubated. Plavix held for possible procedures.  10/30: Pt remains on a ventilator at an FiO2 of 28% without sedation. Vancomycin has been stopped and unasyn continued. Pt has been tolerating OG tube feeds. Urine tox was negative yesterday. Head CT showed no changes. Palliative consulted, family making decision today regarding next step. GI and ENT have been consulted about possible PEG and trach placements.   REVIEW OF SYSTEMS  PATIENT IS UNABLE TO PROVIDE COMPLETE REVIEW OF SYSTEMS DUE TO SEVERE CRITICAL ILLNESS   PHYSICAL EXAMINATION:  GENERAL: Obtunded (no sedation), synchronous with the ventilator HEAD: Normocephalic, atraumatic.  EYES: Pupils equal, round, reactive to light.  No scleral icterus.  MOUTH: Endotracheal tube in place.  Copious oral secretions. NECK: Supple.  PULMONARY: + Diffuse rhonchi, no wheezing CARDIOVASCULAR: S1 and S2. Regular rate and rhythm. No murmurs, rubs, or gallops.  GASTROINTESTINAL: Soft,non-distended. No  masses. Positive bowel sounds. MUSCULOSKELETAL: No swelling, clubbing, or edema.  NEUROLOGIC: obtunded, GCS<8 SKIN:intact,warm,dry  MEDICATIONS: I have reviewed all medications and confirmed regimen as documented   CULTURE RESULTS   Recent Results (from the past 240 hour(s))  SARS Coronavirus 2 by RT PCR (hospital order, performed in [facility] hospital lab) Nasopharyngeal Nasopharyngeal Swab     Status: None   Collection Time: 12/14/2018  1:51 PM   Specimen: Nasopharyngeal Swab  Result Value Ref Range Status   SARS Coronavirus 2 NEGATIVE NEGATIVE Final    Comment: (NOTE) If result is NEGATIVE SARS-CoV-2 target nucleic acids are NOT DETECTED. The SARS-CoV-2 RNA is generally detectable in upper and lower  respiratory specimens during the acute phase of infection. The lowest  concentration of SARS-CoV-2 viral copies this assay can detect is 250  copies / mL. A negative result does not preclude SARS-CoV-2 infection  and should not be used as the sole basis for treatment or other  patient management decisions.  A negative result may occur with  improper specimen collection / handling, submission of specimen other  than nasopharyngeal swab, presence of viral mutation(s) within the  areas targeted by this assay, and inadequate number of viral copies  (<250 copies / mL). A negative result must be combined with clinical  observations, patient history, and epidemiological information. If result is POSITIVE SARS-CoV-2 target nucleic acids are DETECTED. The SARS-CoV-2 RNA is generally detectable in upper and lower  respiratory specimens dur ing the acute phase of infection.  Positive  results are indicative of active infection with  SARS-CoV-2.  Clinical  correlation with patient history and other diagnostic information is  necessary to determine patient infection status.  Positive results do  not rule out bacterial infection or co-infection with other viruses. If result is PRESUMPTIVE  POSTIVE SARS-CoV-2 nucleic acids MAY BE PRESENT.   A presumptive positive result was obtained on the submitted specimen  and confirmed on repeat testing.  While 2019 novel coronavirus  (SARS-CoV-2) nucleic acids may be present in the submitted sample  additional confirmatory testing may be necessary for epidemiological  and / or clinical management purposes  to differentiate between  SARS-CoV-2 and other Sarbecovirus currently known to infect humans.  If clinically indicated additional testing with an alternate test  methodology [phone]) is advised. The SARS-CoV-2 RNA is generally  detectable in upper and lower respiratory sp ecimens during the acute  phase of infection. The expected result is Negative. Fact Sheet for Patients:  StrictlyIdeas.no Fact Sheet for Healthcare Providers: BankingDealers.co.za This test is not yet approved or cleared by the [place] FDA and has been authorized for detection and/or diagnosis of SARS-CoV-2 by FDA under an Emergency Use Authorization (EUA).  This EUA will remain in effect (meaning this test can be used) for the duration of the COVID-19 declaration under Section 564(b)(1) of the Act, 21 U.S.C. section 360bbb-3(b)(1), unless the authorization is terminated or revoked sooner. Performed at [facility], [address] [place], [place] [address]   Culture, blood (Routine X 2) w Reflex to ID Panel     Status: None (Preliminary result)   Collection Time: 01/09/19 10:38 PM   Specimen: BLOOD  Result Value Ref Range Status   Specimen Description BLOOD BLOOD LEFT HAND  Final   Special Requests   Final    BOTTLES DRAWN AEROBIC AND ANAEROBIC Blood Culture adequate volume   Culture   Final    NO GROWTH 3 DAYS Performed at [facility], [address], [place], [place] [address]    Report Status PENDING  Incomplete  Culture, blood (Routine X 2) w Reflex to ID Panel     Status: None  (Preliminary result)   Collection Time: 01/09/19 10:39 PM   Specimen: BLOOD  Result Value Ref Range Status   Specimen Description BLOOD BLOOD LEFT WRIST  Final   Special Requests   Final    BOTTLES DRAWN AEROBIC AND ANAEROBIC Blood Culture adequate volume   Culture   Final    NO GROWTH 3 DAYS Performed at [facility], [address], [place], [place] [address]    Report Status PENDING  Incomplete  MRSA PCR Screening     Status: None   Collection Time: 01/09/19 11:21 PM   Specimen: Nasal Mucosa; Nasopharyngeal  Result Value Ref Range Status   MRSA by PCR NEGATIVE NEGATIVE Final    Comment:        The GeneXpert MRSA Assay (FDA approved for NASAL specimens only), is one component of a comprehensive MRSA colonization surveillance program. It is not intended to diagnose MRSA infection nor to guide or monitor treatment for MRSA infections. Performed at [facility], [address] [place], [place] [address]   Culture, respiratory (non-expectorated)     Status: None (Preliminary result)   Collection Time: 01/10/19  6:35 AM   Specimen: Tracheal Aspirate; Respiratory  Result Value Ref Range Status   Specimen Description   Final    TRACHEAL ASPIRATE Performed at [facility], [address] [place],  [address]  Special Requests   Final    Normal Performed at [address] [place], [place] [address]    Gram Stain   Final    MODERATE WBC PRESENT, PREDOMINANTLY PMN NO ORGANISMS SEEN    Culture   Final    NO GROWTH 2 DAYS Performed at [facility] [address], [place], [place] [address]    Report Status PENDING  Incomplete          IMAGING    Ct Head Wo Contrast  Result Date: 01/11/2019 CLINICAL DATA:  Stroke. Left-sided weakness. Confusion and slurred speech. Multiple recent posterior circulation infarctions EXAM: CT HEAD WITHOUT CONTRAST TECHNIQUE: Contiguous axial images were obtained from  the base of the skull through the vertex without intravenous contrast. COMPARISON:  01/10/2019 FINDINGS: Brain: No discernible change. Areas of low-density related to the acute infarctions in the right pons and midbrain, the right cerebellum and the right occipital lobe. No evidence of new acute infarction. No hemorrhage. No change and swelling. No hydrocephalus. 3.4 cm right parasagittal vertex meningioma is unchanged. Vascular: There is atherosclerotic calcification of the major vessels at the base of the brain. Skull: Negative Sinuses/Orbits: Clear/normal Other: None IMPRESSION: 1. No change since yesterday's study. Areas of low-density related to the acute infarctions in the right pons and midbrain, the right cerebellum and the right occipital lobe. No evidence of new infarction or hemorrhage. No hydrocephalus. 2. 3.4 cm right parasagittal vertex meningioma, unchanged. 3. Atherosclerosis of the major vessels at the base of the brain. Electronically Signed   By: Nelson Chimes M.D.   On: 01/11/2019 13:04       Indwelling Urinary Catheter continued, requirement due to   Reason to continue Indwelling Urinary Catheter strict Intake/Output monitoring for hemodynamic instability   Central Line/ continued, requirement due to  Reason to continue [place] of central venous pressure or other hemodynamic parameters and poor IV access   Ventilator continued, requirement due to obtundation,  inability to protect airway, copious secretions   Ventilator Sedation RASS 0 to -2      ASSESSMENT AND PLAN SYNOPSIS   Severe ACUTE Hypoxic and Hypercapnic Respiratory Failure -continue Full MV support -continue Bronchodilator Therapy -Wean Fio2 and PEEP as tolerated -failure to wean from ventilator  -ENT consulted about possible trach placement. Agreed to procedure once consent is obtained.    NEUROLOGIC - intubated, obtunded - Currently on no sedation Wake up assessment: No response off of  sedatives.   CARDIAC -ICU monitoring -Continue to hold plavix -Continue permissive HTN   ID -continue IV abx as prescibed - continue unasyn  -follow up cultures  GI -GI PROPHYLAXIS as indicated  NUTRITIONAL STATUS DIET-->TF's as tolerated Constipation protocol as indicated  ENDO - will use ICU hypoglycemic\Hyperglycemia protocol if indicated   ELECTROLYTES -follow labs as needed -replace as needed -pharmacy consultation and following   DVT/GI PRX ordered TRANSFUSIONS AS NEEDED MONITOR FSBS ASSESS the need for LABS as needed   Critical Care Time devoted to patient care services described in this note is 35 minutes.   Overall, patient is critically ill, prognosis is guarded.  Palliative care and neurology conferred with family.  They are entertaining potential extubation to comfort measures.  This would be reasonable given the patient's poor prognosis with regards to his neurologic insult.   Donnamarie Rossetti, [place] PA-S, 01/12/2019: Acted as my Education administrator.   Renold Don, MD [place] [facility]

## 2019-01-12 NOTE — Progress Notes (Signed)
Assisted tele visit to patient with Dr Doy Mince and family members.Maryelizabeth Rowan, RN

## 2019-01-12 NOTE — Progress Notes (Addendum)
Daily Progress Note   Patient Name: Paul Leonard       Date: 01/12/2019 DOB: Jul 27, 1960  Age: 58 y.o. MRN#: [id number] Attending Physician: Sela Hua, MD Primary Care Physician: Patient, No Pcp Per Admit Date: 12/31/2018  Reason for Consultation/Follow-up: Establishing goals of care  Subjective: Patient is resting in bed on ventilator. No sedation. Spoke with family on E-link, including the daughter, son, and patient's sisters.   We discussed his diagnosis, prognosis, GOC, EOL wishes disposition and options. The difference between an aggressive medical intervention path and a comfort care path was discussed.  Values and goals of care important to patient and family were attempted to be elicited.  Discussed limitations of medical interventions to prolong quality of life in some situations and discussed the concept of human mortality.   Dicussed his status and concern for quality of life as patient would not want to live in a facility or have people provide care for him. Dr. Doy Mince in to speak with family as well.   Family states they want their father here on earth, but do not want him to suffer or have a QOL that would not be acceptable. They would like a couple of more days to discuss it, but sound to be leaning toward one way extubation and comfort care.    Length of Stay: 3  Current Medications: Scheduled Meds:  . aspirin  162.5 mg Per Tube Daily  . atorvastatin  80 mg Per Tube q1800  . chlorhexidine gluconate (MEDLINE KIT)  15 mL Mouth Rinse BID  . Chlorhexidine Gluconate Cloth  6 each Topical Daily  . enoxaparin (LOVENOX) injection  40 mg Subcutaneous Q24H  . feeding supplement (PRO-STAT SUGAR FREE 64)  30 mL Per Tube BID  . mouth rinse  15 mL Mouth Rinse 10 times per  day  . pantoprazole sodium  40 mg Per Tube Daily  . polyethylene glycol  17 g Oral Daily  . scopolamine  1 patch Transdermal Q72H    Continuous Infusions: . sodium chloride 10 mL/hr at 01/12/19 0900  . ampicillin-sulbactam (UNASYN) IV Stopped (01/12/19 2426)  . feeding supplement (VITAL 1.5 CAL) 1,000 mL (01/11/19 1409)  . propofol (DIPRIVAN) infusion Stopped (01/11/19 0727)    PRN Meds: sodium chloride, acetaminophen **OR** acetaminophen (TYLENOL) oral liquid 160 mg/5 mL **OR**  acetaminophen, fentaNYL (SUBLIMAZE) injection, ipratropium-albuterol, labetalol, midazolam, midazolam, senna-docusate  Physical Exam Constitutional:      Comments: Eyes closed. On vent at this time.              Vital Signs: BP (!) 156/102   Pulse 84   Temp 99.3 F (37.4 C) (Oral)   Resp 13   Ht _0  (1.778 m)   Wt 75.4 kg   SpO2 100%   BMI 23.85 kg/m  SpO2: SpO2: 100 % O2 Device: O2 Device: Ventilator O2 Flow Rate: O2 Flow Rate (L/min): 2 L/min  Intake/output summary:   Intake/Output Summary (Last 24 hours) at 01/12/2019 1244 Last data filed at 01/12/2019 0900 Gross per 24 hour  Intake 825.32 ml  Output 500 ml  Net 325.32 ml   LBM: Last BM Date: 01/11/19 Baseline Weight: Weight: 83.9 kg Most recent weight: Weight: 75.4 kg       Palliative Assessment/Data: Intubated      Patient Active Problem List   Diagnosis Date Noted  . Abnormal EKG   . Acute CVA (cerebrovascular accident) ([place]) 01/01/2019    Palliative Care Assessment & Plan    Recommendations/Plan:  Children and other family sound to be leaning toward one way extubation, but want a couple of days to discuss it. They will also discuss code status change to no resuscitation if cardiac arrest.    Code Status:    Code Status Orders  (From admission, onward)         Start     Ordered   01/09/19 2127  Full code  Continuous     01/09/19 2126        Code Status History    Date Active Date Inactive Code  Status Order ID Comments User Context   01/01/2019 2113 01/04/2019 2153 Full Code [id number]  Demetrios Loll, MD Inpatient   Advance Care Planning Activity       Prognosis:  Very poor.    Care plan was discussed with CCM and neurology  Thank you for allowing the Palliative Medicine Team to assist in the care of this patient.   Time In: 11:00 Time Out: 12:30 Total Time 90 min Prolonged Time Billed  yes      Greater than 50%  of this time was spent counseling and coordinating care related to the above assessment and plan.  Asencion Gowda, NP  Please contact Palliative Medicine Team phone at [phone] for questions and concerns.

## 2019-01-12 NOTE — Consult Note (Signed)
Pharmacy Antibiotic Note  Paul Leonard is a 58 y.o. male admitted on 12/14/2018 with aspiration pneumonia.  Patient was on vancomycin and Unasyn on 10/27.   10/29: Due to the lack of culture growth, stable WBCs, and afebrile status, the team elected to de-escalate therapy to Unasyn. Zosyn was discontinued, along with vancomycin d/t MRSA PCR (-).   Plan: -Continue Unasyn 3g IV q6h  -Monitor renal function and s/sx of infections and adverse effects  Height: 5\' 10"  (177.8 cm) Weight: 166 lb 3.6 oz (75.4 kg) IBW/kg (Calculated) : 73  Temp (24hrs), Avg:99.1 F (37.3 C), Min:97.9 F (36.6 C), Max:101.7 F (38.7 C)  Recent Labs  Lab 12/17/2018 1353 01/09/19 2007 01/09/19 2239 01/10/19 0131 01/11/19 0328 01/12/19 0327  WBC 7.3  --  7.0  --  7.2  --   CREATININE 1.21 0.96 0.94 0.86 0.99 0.99  LATICACIDVEN  --   --  1.5  --   --   --     Estimated Creatinine Clearance: 83 mL/min (by C-G formula based on SCr of 0.99 mg/dL).    No Known Allergies  Antimicrobials this admission: Vancomycin 10/27>>10/28 Unasyn 10/27>>10/28, 10/29 >> Zosyn 10/28>> 10/29  Microbiology results: 10/27 BCx: pending, no growth x 3 days 10/27 MRSA PCR: negative 10/26 SARs-CoV-2: negative  Thank you for allowing pharmacy to be a part of this patient's care.  Raiford Simmonds, PharmD Candidate 01/12/2019 1:43 PM

## 2019-01-12 NOTE — Progress Notes (Signed)
[facility] NAME: Paul Leonard    MR#:  [id number]  DATE OF BIRTH:  [birth year]  SUBJECTIVE:   Patient remains intubated.  He is unresponsive despite being off sedation.  Family discussing goals of care.  REVIEW OF SYSTEMS:  ROS-unable to obtain, as patient is intubated and sedated  DRUG ALLERGIES:  No Known Allergies VITALS:  Blood pressure 113/75, pulse 76, temperature (!) 101.7 F (38.7 C), temperature source Oral, resp. rate 16, height 5\' 10"  (1.778 m), weight 75.4 kg, SpO2 98 %. PHYSICAL EXAMINATION:  Physical Exam  GENERAL:  Laying in the bed with no acute distress.  HEENT: Head atraumatic, normocephalic. Pupils equal, round, reactive to light and accommodation. No scleral icterus. Extraocular muscles intact. Oropharynx and nasopharynx clear. ETT in place. NECK:  Supple, no jugular venous distention. No thyroid enlargement. LUNGS: Lungs are clear to auscultation bilaterally. No wheezes, crackles, rhonchi. No use of accessory muscles of respiration.  CARDIOVASCULAR: RRR, S1, S2 normal. No murmurs, rubs, or gallops.  ABDOMEN: Soft, nontender, nondistended. Bowel sounds present.  EXTREMITIES: No pedal edema, cyanosis, or clubbing.  NEUROLOGIC: Unable to assess- intubated and sedated PSYCHIATRIC: Unable to assess SKIN: No obvious rash, lesion, or ulcer.  LABORATORY PANEL:  Male CBC Recent Labs  Lab 01/11/19 0328  WBC 7.2  HGB 15.5  HCT 49.5  PLT 288   ------------------------------------------------------------------------------------------------------------------ Chemistries  Recent Labs  Lab 01/09/19 2239  01/12/19 0327  NA 140   < > 144  K 3.3*   < > 3.8  CL 105   < > 109  CO2 24   < > 27  GLUCOSE 92   < > 153*  BUN 28*   < > 31*  CREATININE 0.94   < > 0.99  CALCIUM 8.4*   < > 8.5*  MG  --   --  2.3  AST 16  --   --   ALT 17  --   --   ALKPHOS 58  --   --   BILITOT 1.4*  --   --    < > = values in  this interval not displayed.   RADIOLOGY:  No results found. 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 Remains unresponsive this morning. -Neurology following -EEG 10/28 was unremarkable -Continue aspirin -Palliative care consulted- family discussing their wishes for patient, leaning towards palliative extubation and full comfort care -Patient will need trach and PEG for survivial if family wishes to pursue aggressive medical intervention  Acute hypoxic and hypercapnic respiratory failure-secondary to aspiration pneumonia -Vent management per CCM -Continue unasyn -Blood and sputum cultures with no growth  Abnormal EKG- initial concern for STEMI, but cardiology feels that his EKG changes are more related to LVH. -Cardiology has signed off  Hypertension -Permissive hypertension for now -IV labetalol as needed  All the records are reviewed and case discussed with Care Management/Social Worker. Management plans discussed with the patient, family and they are in agreement.  CODE STATUS: Full Code  TOTAL TIME TAKING CARE OF THIS PATIENT: 38 minutes.   More than 50% of the time was spent in counseling/coordination of care: YES  POSSIBLE D/C unknown, DEPENDING ON CLINICAL CONDITION.   Berna Spare Mayo M.D on 01/12/2019 at 2:24 PM  Between 7am to 6pm - Pager [phone]  After 6pm go to [email or web address] - [facility]  Office  [phone]  CC: Primary care physician; Patient, No Pcp Per  Note: This dictation was prepared with Dragon dictation along with smaller phrase technology. Any transcriptional errors that result from this process are unintentional.

## 2019-01-12 NOTE — Progress Notes (Signed)
Assisted tele visit to patient with family members and [place] Team.  Maryelizabeth Rowan, RN

## 2019-01-12 NOTE — Progress Notes (Signed)
Subjective: Patient remains unresponsive and on no sedation.    Objective: Current vital signs: BP (!) 130/92   Pulse 85   Temp 97.9 F (36.6 C) (Axillary)   Resp 16   Ht 5' 10" (1.778 m)   Wt 75.4 kg   SpO2 97%   BMI 23.85 kg/m  Vital signs in last 24 hours: Temp:  [97.9 F (36.6 C)-98.7 F (37.1 C)] 97.9 F (36.6 C) (10/30 0400) Pulse Rate:  [83-97] 85 (10/30 0600) Resp:  [4-17] 16 (10/30 0600) BP: (98-137)/(76-99) 130/92 (10/30 0600) SpO2:  [96 %-99 %] 97 % (10/30 0600) FiO2 (%):  [28 %] 28 % (10/30 0745) Weight:  [75.4 kg] 75.4 kg (10/30 0416)  Intake/Output from previous day: 10/29 0701 - 10/30 0700 In: 966.4 [I.V.:216.4; NG/GT:300; IV Piggyback:450] Out: 1150 [Urine:1150] Intake/Output this shift: No intake/output data recorded. Nutritional status:  Diet Order            Diet NPO time specified  Diet effective now              Neurologic Exam: Mental Status: Patient does not respond to verbal stimuli.  Does not respond to deep sternal rub.  Does not follow commands.  No verbalizations noted.  Cranial Nerves: II: patient does not respond confrontation bilaterally, pupils right 3 mm, left 3 mm,and reactive bilaterally III,IV,VI: Oculocephalic response absent bilaterally.  V,VII: corneal reflex present bilaterally  VIII: patient does not respond to verbal stimuli IX,X: gag reflex reduced, XI: trapezius strength unable to test bilaterally XII: tongue strength unable to test Motor: Extremities flaccid throughout.  No spontaneous movement noted.  No purposeful movements noted.   Lab Results: Basic Metabolic Panel: Recent Labs  Lab 01/12/2019 1353 01/09/19 2007 01/09/19 2239 01/10/19 0131 01/11/19 0328 01/12/19 0327  NA 141  --  140 142 143 144  K 3.6  --  3.3* 3.4* 3.9 3.8  CL 99  --  105 107 110 109  CO2 30  --  _0 GLUCOSE 110*  --  92 95 110* 153*  BUN 25*  --  28* 25* 28* 31*  CREATININE 1.21 0.96 0.94 0.86 0.99 0.99  CALCIUM 9.2   --  8.4* 8.2* 8.5* 8.5*  MG  --   --   --   --   --  2.3  PHOS  --   --   --   --   --  2.7    Liver Function Tests: Recent Labs  Lab 12/17/2018 1353 01/09/19 2239  AST 16 16  ALT 21 17  ALKPHOS 60 58  BILITOT 1.2 1.4*  PROT 7.0 6.3*  ALBUMIN 3.8 3.3*   No results for input(s): LIPASE, AMYLASE in the last 168 hours. No results for input(s): AMMONIA in the last 168 hours.  CBC: Recent Labs  Lab 01/04/2019 1353 01/09/19 2239 01/11/19 0328  WBC 7.3 7.0 7.2  NEUTROABS 4.6  --   --   HGB 17.4* 16.8 15.5  HCT 53.7* 53.0* 49.5  MCV 87.7 89.5 89.8  PLT 353 320 288    Cardiac Enzymes: No results for input(s): CKTOTAL, CKMB, CKMBINDEX, TROPONINI in the last 168 hours.  Lipid Panel: Recent Labs  Lab 01/10/19 0131 01/10/19 0814  CHOL 116  --   TRIG 71 74  HDL 28*  --   CHOLHDL 4.1  --   VLDL 14  --   LDLCALC 74  --     CBG: Recent Labs  Lab 01/11/19 1534  01/11/19 2109 01/11/19 2328 01/12/19 0407 01/12/19 0753  GLUCAP 118* 126* 107* 141* 129*    Microbiology: Results for orders placed or performed during the hospital encounter of 12/15/2018  SARS Coronavirus 2 by RT PCR (hospital order, performed in [place] hospital lab) Nasopharyngeal Nasopharyngeal Swab     Status: None   Collection Time: 01/06/2019  1:51 PM   Specimen: Nasopharyngeal Swab  Result Value Ref Range Status   SARS Coronavirus 2 NEGATIVE NEGATIVE Final    Comment: (NOTE) If result is NEGATIVE SARS-CoV-2 target nucleic acids are NOT DETECTED. The SARS-CoV-2 RNA is generally detectable in upper and lower  respiratory specimens during the acute phase of infection. The lowest  concentration of SARS-CoV-2 viral copies this assay can detect is 250  copies / mL. A negative result does not preclude SARS-CoV-2 infection  and should not be used as the sole basis for treatment or other  patient management decisions.  A negative result may occur with  improper specimen collection / handling, submission of  specimen other  than nasopharyngeal swab, presence of viral mutation(s) within the  areas targeted by this assay, and inadequate number of viral copies  (<250 copies / mL). A negative result must be combined with clinical  observations, patient history, and epidemiological information. If result is POSITIVE SARS-CoV-2 target nucleic acids are DETECTED. The SARS-CoV-2 RNA is generally detectable in upper and lower  respiratory specimens dur ing the acute phase of infection.  Positive  results are indicative of active infection with SARS-CoV-2.  Clinical  correlation with patient history and other diagnostic information is  necessary to determine patient infection status.  Positive results do  not rule out bacterial infection or co-infection with other viruses. If result is PRESUMPTIVE POSTIVE SARS-CoV-2 nucleic acids MAY BE PRESENT.   A presumptive positive result was obtained on the submitted specimen  and confirmed on repeat testing.  While 2019 novel coronavirus  (SARS-CoV-2) nucleic acids may be present in the submitted sample  additional confirmatory testing may be necessary for epidemiological  and / or clinical management purposes  to differentiate between  SARS-CoV-2 and other Sarbecovirus currently known to infect humans.  If clinically indicated additional testing with an alternate test  methodology ([id number]) is advised. The SARS-CoV-2 RNA is generally  detectable in upper and lower respiratory sp ecimens during the acute  phase of infection. The expected result is Negative. Fact Sheet for Patients:  https://www.fda.gov/media/136312/download Fact Sheet for Healthcare Providers: https://www.fda.gov/media/136313/download This test is not yet approved or cleared by the [place] FDA and has been authorized for detection and/or diagnosis of SARS-CoV-2 by FDA under an Emergency Use Authorization (EUA).  This EUA will remain in effect (meaning this test can be used) for the  duration of the COVID-19 declaration under Section 564(b)(1) of the Act, 21 U.S.C. section 360bbb-3(b)(1), unless the authorization is terminated or revoked sooner. Performed at [facility], [address] [place], [place] [address]   Culture, blood (Routine X 2) w Reflex to ID Panel     Status: None (Preliminary result)   Collection Time: 01/09/19 10:38 PM   Specimen: BLOOD  Result Value Ref Range Status   Specimen Description BLOOD BLOOD LEFT HAND  Final   Special Requests   Final    BOTTLES DRAWN AEROBIC AND ANAEROBIC Blood Culture adequate volume   Culture   Final    NO GROWTH 3 DAYS Performed at [facility], [address] [place], [place] [address]      Report Status PENDING  Incomplete  Culture, blood (Routine X 2) w Reflex to ID Panel     Status: None (Preliminary result)   Collection Time: 01/09/19 10:39 PM   Specimen: BLOOD  Result Value Ref Range Status   Specimen Description BLOOD BLOOD LEFT WRIST  Final   Special Requests   Final    BOTTLES DRAWN AEROBIC AND ANAEROBIC Blood Culture adequate volume   Culture   Final    NO GROWTH 3 DAYS Performed at [facility], [address], [place], [place] [address]    Report Status PENDING  Incomplete  MRSA PCR Screening     Status: None   Collection Time: 01/09/19 11:21 PM   Specimen: Nasal Mucosa; Nasopharyngeal  Result Value Ref Range Status   MRSA by PCR NEGATIVE NEGATIVE Final    Comment:        The GeneXpert MRSA Assay (FDA approved for NASAL specimens only), is one component of a comprehensive MRSA colonization surveillance program. It is not intended to diagnose MRSA infection nor to guide or monitor treatment for MRSA infections. Performed at [facility], [address] [place], [place] [address]   Culture, respiratory (non-expectorated)     Status: None (Preliminary result)   Collection Time: 01/10/19  6:35 AM   Specimen: Tracheal Aspirate; Respiratory  Result  Value Ref Range Status   Specimen Description   Final    TRACHEAL ASPIRATE Performed at [facility], [address] [place], [place] [address]    Special Requests   Final    Normal Performed at [facility], [address] [place], [place] [address]    Gram Stain   Final    MODERATE WBC PRESENT, PREDOMINANTLY PMN NO ORGANISMS SEEN    Culture   Final    NO GROWTH < 12 HOURS Performed at [facility] [address], [place], [place] [address]    Report Status PENDING  Incomplete    Coagulation Studies: No results for input(s): LABPROT, INR in the last 72 hours.  Imaging: Dg Abd 1 View  Result Date: 01/10/2019 CLINICAL DATA:  OG tube placement EXAM: ABDOMEN - 1 VIEW COMPARISON:  None. FINDINGS: Enteric tube terminates in the mid gastric body. Nonobstructive bowel gas pattern. Visualized osseous structures are within normal limits. IMPRESSION: Enteric tube terminates in the mid gastric body. Electronically Signed   By: Julian Hy M.D.   On: 01/10/2019 10:13   Ct Head Wo Contrast  Result Date: 01/11/2019 CLINICAL DATA:  Stroke. Left-sided weakness. Confusion and slurred speech. Multiple recent posterior circulation infarctions EXAM: CT HEAD WITHOUT CONTRAST TECHNIQUE: Contiguous axial images were obtained from the base of the skull through the vertex without intravenous contrast. COMPARISON:  01/10/2019 FINDINGS: Brain: No discernible change. Areas of low-density related to the acute infarctions in the right pons and midbrain, the right cerebellum and the right occipital lobe. No evidence of new acute infarction. No hemorrhage. No change and swelling. No hydrocephalus. 3.4 cm right parasagittal vertex meningioma is unchanged. Vascular: There is atherosclerotic calcification of the major vessels at the base of the brain. Skull: Negative Sinuses/Orbits: Clear/normal Other: None IMPRESSION: 1. No change since yesterday's study. Areas of low-density related  to the acute infarctions in the right pons and midbrain, the right cerebellum and the right occipital lobe. No evidence of new infarction or hemorrhage. No hydrocephalus. 2. 3.4 cm right parasagittal vertex meningioma, unchanged. 3. Atherosclerosis of the major vessels at the base of the brain.  Electronically Signed   By: Mark  Shogry M.D.   On: 01/11/2019 13:04    Medications:  I have reviewed the patient's current medications. Scheduled: . aspirin  162.5 mg Per Tube Daily  . atorvastatin  80 mg Per Tube q1800  . chlorhexidine gluconate (MEDLINE KIT)  15 mL Mouth Rinse BID  . Chlorhexidine Gluconate Cloth  6 each Topical Daily  . enoxaparin (LOVENOX) injection  40 mg Subcutaneous Q24H  . feeding supplement (PRO-STAT SUGAR FREE 64)  30 mL Per Tube BID  . mouth rinse  15 mL Mouth Rinse 10 times per day  . pantoprazole sodium  40 mg Per Tube Daily  . polyethylene glycol  17 g Oral Daily  . 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  Intubated.  On ASA.  Repeat head CT unchanged.    Recommendations: 1. Continue ASA   LOS: 3 days    , MD Neurology [phone] 01/12/2019  9:17 AM    

## 2019-01-12 NOTE — Progress Notes (Signed)
Pharmacy Electrolyte Monitoring Consult:  Pharmacy consulted to assist in monitoring and replacing electrolytes in this 58 y.o. male admitted on 12/25/2018. Patient currently in the ICU with acute hypoxic and hypercapnic respiratory failure from aspiration with progressive encephalopathy. 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   Labs:  Sodium (mmol/L)  Date Value  01/12/2019 144  10/10/2013 143   Potassium (mmol/L)  Date Value  01/12/2019 3.8  10/10/2013 3.8   Magnesium (mg/dL)  Date Value  01/12/2019 2.3   Phosphorus (mg/dL)  Date Value  01/12/2019 2.7   Calcium (mg/dL)  Date Value  01/12/2019 8.5 (L)   Calcium, Total (mg/dL)  Date Value  10/10/2013 8.6   Albumin (g/dL)  Date Value  01/09/2019 3.3 (L)   Corrected Calcium: 9.1 mg/dL  Assessment/Plan: 1. Electrolytes: no replacement warranted. Patient receiving Vital 1.5 at 52mL/hr. Will obtain follow up electrolytes with AM labs.   2. Glucose: Patient is having scheduled glucose checks. Will continue to monitor and add SSI as appropriate.   3. Constipation: LBM 10/29. Will continue to monitor.   Pharmacy will continue to monitor and adjust per consult.   Raiford Simmonds, PharmD Candidate 01/12/2019 1:47 PM

## 2019-01-12 NOTE — Progress Notes (Signed)
Paul Darby, MD [address]  [place]  [place], [place] [address]  Main: [phone]  Fax: [phone] Pager: [phone]   Subjective: Patient remains intubated on minimal vent settings.  Tolerating tube feeds well, at goal.  He is off sedation, unresponsive today   Objective: Vital signs in last 24 hours: Vitals:   01/12/19 1800 01/12/19 1900 01/12/19 1919 01/12/19 2000  BP: 138/81 123/81  123/85  Pulse: 77 73  86  Resp: _0 Temp:    98.3 F (36.8 C)  TempSrc:    Axillary  SpO2: 96% 98% 97% 96%  Weight:      Height:       Weight change:   Intake/Output Summary (Last 24 hours) at 01/12/2019 2025 Last data filed at 01/12/2019 1930 Gross per 24 hour  Intake 1838.13 ml  Output 885 ml  Net 953.13 ml     Exam: Heart:: Regular rate and rhythm, S1S2 present or without murmur or extra heart sounds Lungs: normal and clear to auscultation Abdomen: soft, nontender, normal bowel sounds   Lab Results: CBC Latest Ref Rng & Units 01/11/2019 01/09/2019 12/23/2018  WBC 4.0 - 10.5 K/uL 7.2 7.0 7.3  Hemoglobin 13.0 - 17.0 g/dL 15.5 16.8 17.4(H)  Hematocrit 39.0 - 52.0 % 49.5 53.0(H) 53.7(H)  Platelets 150 - 400 K/uL 288 320 353   CMP Latest Ref Rng & Units 01/12/2019 01/11/2019 01/10/2019  Glucose 70 - 99 mg/dL 153(H) 110(H) 95  BUN 6 - 20 mg/dL 31(H) 28(H) 25(H)  Creatinine 0.61 - 1.24 mg/dL 0.99 0.99 0.86  Sodium 135 - 145 mmol/L 144 143 142  Potassium 3.5 - 5.1 mmol/L 3.8 3.9 3.4(L)  Chloride 98 - 111 mmol/L 109 110 107  CO2 22 - 32 mmol/L _1 Calcium 8.9 - 10.3 mg/dL 8.5(L) 8.5(L) 8.2(L)  Total Protein 6.5 - 8.1 g/dL - - -  Total Bilirubin 0.3 - 1.2 mg/dL - - -  Alkaline Phos 38 - 126 U/L - - -  AST 15 - 41 U/L - - -  ALT 0 - 44 U/L - - -    Micro Results: Recent Results (from the past 240 hour(s))  SARS Coronavirus 2 by RT PCR (hospital order, performed in [place] hospital lab) Nasopharyngeal Nasopharyngeal Swab     Status:  None   Collection Time: 12/15/2018  1:51 PM   Specimen: Nasopharyngeal Swab  Result Value Ref Range Status   SARS Coronavirus 2 NEGATIVE NEGATIVE Final    Comment: (NOTE) If result is NEGATIVE SARS-CoV-2 target nucleic acids are NOT DETECTED. The SARS-CoV-2 RNA is generally detectable in upper and lower  respiratory specimens during the acute phase of infection. The lowest  concentration of SARS-CoV-2 viral copies this assay can detect is 250  copies / mL. A negative result does not preclude SARS-CoV-2 infection  and should not be used as the sole basis for treatment or other  patient management decisions.  A negative result may occur with  improper specimen collection / handling, submission of specimen other  than nasopharyngeal swab, presence of viral mutation(s) within the  areas targeted by this assay, and inadequate number of viral copies  (<250 copies / mL). A negative result must be combined with clinical  observations, patient history, and epidemiological information. If result is POSITIVE SARS-CoV-2 target nucleic acids are DETECTED. The SARS-CoV-2 RNA is generally detectable in upper and lower  respiratory specimens dur ing the acute phase of infection.  Positive  results are indicative of active infection with SARS-CoV-2.  Clinical  correlation with patient history and other diagnostic information is  necessary to determine patient infection status.  Positive results do  not rule out bacterial infection or co-infection with other viruses. If result is PRESUMPTIVE POSTIVE SARS-CoV-2 nucleic acids MAY BE PRESENT.   A presumptive positive result was obtained on the submitted specimen  and confirmed on repeat testing.  While 2019 novel coronavirus  (SARS-CoV-2) nucleic acids may be present in the submitted sample  additional confirmatory testing may be necessary for epidemiological  and / or clinical management purposes  to differentiate between  SARS-CoV-2 and other  Sarbecovirus currently known to infect humans.  If clinically indicated additional testing with an alternate test  methodology [phone]) is advised. The SARS-CoV-2 RNA is generally  detectable in upper and lower respiratory sp ecimens during the acute  phase of infection. The expected result is Negative. Fact Sheet for Patients:  StrictlyIdeas.no Fact Sheet for Healthcare Providers: BankingDealers.co.za This test is not yet approved or cleared by the [place] FDA and has been authorized for detection and/or diagnosis of SARS-CoV-2 by FDA under an Emergency Use Authorization (EUA).  This EUA will remain in effect (meaning this test can be used) for the duration of the COVID-19 declaration under Section 564(b)(1) of the Act, 21 U.S.C. section 360bbb-3(b)(1), unless the authorization is terminated or revoked sooner. Performed at [facility], [address] [place], [place] [address]   Culture, blood (Routine X 2) w Reflex to ID Panel     Status: None (Preliminary result)   Collection Time: 01/09/19 10:38 PM   Specimen: BLOOD  Result Value Ref Range Status   Specimen Description BLOOD BLOOD LEFT HAND  Final   Special Requests   Final    BOTTLES DRAWN AEROBIC AND ANAEROBIC Blood Culture adequate volume   Culture   Final    NO GROWTH 3 DAYS Performed at [facility], [address], [place], [place] [address]    Report Status PENDING  Incomplete  Culture, blood (Routine X 2) w Reflex to ID Panel     Status: None (Preliminary result)   Collection Time: 01/09/19 10:39 PM   Specimen: BLOOD  Result Value Ref Range Status   Specimen Description BLOOD BLOOD LEFT WRIST  Final   Special Requests   Final    BOTTLES DRAWN AEROBIC AND ANAEROBIC Blood Culture adequate volume   Culture   Final    NO GROWTH 3 DAYS Performed at [facility], [address] [place], [place] [address]    Report Status PENDING   Incomplete  MRSA PCR Screening     Status: None   Collection Time: 01/09/19 11:21 PM   Specimen: Nasal Mucosa; Nasopharyngeal  Result Value Ref Range Status   MRSA by PCR NEGATIVE NEGATIVE Final    Comment:        The GeneXpert MRSA Assay (FDA approved for NASAL specimens only), is one component of a comprehensive MRSA colonization surveillance program. It is not intended to diagnose MRSA infection nor to guide or monitor treatment for MRSA infections. Performed at [facility], [address] [place], [place] [address]   Culture, respiratory (non-expectorated)     Status: None (Preliminary result)   Collection Time: 01/10/19  6:35 AM   Specimen: Tracheal Aspirate; Respiratory  Result Value Ref Range Status   Specimen Description   Final    TRACHEAL ASPIRATE Performed at [facility], [address] [place], [place] [address]    Special Requests   Final    Normal Performed at [facility], [address] [place], [place] [address]    Gram Stain   Final    MODERATE WBC PRESENT, PREDOMINANTLY PMN NO ORGANISMS SEEN    Culture   Final    NO GROWTH 2 DAYS Performed at [facility], [place] [address], [place], [place] [address]    Report Status PENDING  Incomplete   Studies/Results: Ct Head Wo Contrast  Result Date: 01/11/2019 CLINICAL DATA:  Stroke. Left-sided weakness. Confusion and slurred speech. Multiple recent posterior circulation infarctions EXAM: CT HEAD WITHOUT CONTRAST TECHNIQUE: Contiguous axial images were obtained from the base of the skull through the vertex without intravenous contrast. COMPARISON:  01/10/2019 FINDINGS: Brain: No discernible change. Areas of low-density related to the acute infarctions in the right pons and midbrain, the right cerebellum and the right occipital lobe. No evidence of new acute infarction. No hemorrhage. No change and swelling. No hydrocephalus. 3.4 cm right parasagittal vertex meningioma is  unchanged. Vascular: There is atherosclerotic calcification of the major vessels at the base of the brain. Skull: Negative Sinuses/Orbits: Clear/normal Other: None IMPRESSION: 1. No change since yesterday's study. Areas of low-density related to the acute infarctions in the right pons and midbrain, the right cerebellum and the right occipital lobe. No evidence of new infarction or hemorrhage. No hydrocephalus. 2. 3.4 cm right parasagittal vertex meningioma, unchanged. 3. Atherosclerosis of the major vessels at the base of the brain. Electronically Signed   By: Nelson Chimes M.D.   On: 01/11/2019 13:04   Medications:  I have reviewed the patient's current medications. Prior to Admission:  Medications Prior to Admission  Medication Sig Dispense Refill Last Dose   acetaminophen (TYLENOL) 325 MG tablet Take 650 mg by mouth every 6 (six) hours as needed.   01/10/2019 at Unknown time   amLODipine (NORVASC) 5 MG tablet Take 1 tablet (5 mg total) by mouth daily. 30 tablet 0 12/18/2018 at unknown   aspirin 81 MG chewable tablet Chew 1 tablet (81 mg total) by mouth daily. 30 tablet 0 12/25/2018 at unknown   atorvastatin (LIPITOR) 40 MG tablet Take 1 tablet (40 mg total) by mouth daily at 6 PM. 30 tablet 0 Past Week at unknown   clopidogrel (PLAVIX) 75 MG tablet Take 1 tablet (75 mg total) by mouth daily. 21 tablet 0 01/12/2019 at unknown   hydrochlorothiazide (HYDRODIURIL) 25 MG tablet Take 1 tablet (25 mg total) by mouth daily. 30 tablet 0 12/21/2018 at unknown   lisinopril (ZESTRIL) 20 MG tablet Take 1 tablet (20 mg total) by mouth daily. 30 tablet 0 01/09/2019 at unknown   metoprolol tartrate (LOPRESSOR) 25 MG tablet Take 1 tablet (25 mg total) by mouth 2 (two) times daily. 60 tablet 0 01/13/2019 at unknown   senna-docusate (SENOKOT-S) 8.6-50 MG tablet Take 1 tablet by mouth 2 (two) times daily. 60 tablet 0 01/04/2019 at unknown   Scheduled:  aspirin  162.5 mg Per Tube Daily   atorvastatin  80  mg Per Tube q1800   chlorhexidine gluconate (MEDLINE KIT)  15 mL Mouth Rinse BID   Chlorhexidine Gluconate Cloth  6 each Topical Daily   enoxaparin (LOVENOX) injection  40 mg Subcutaneous Q24H   feeding supplement (PRO-STAT SUGAR FREE 64)  30 mL Per Tube BID   mouth rinse  15 mL Mouth Rinse 10 times per day   pantoprazole sodium  40 mg  Per Tube Daily   polyethylene glycol  17 g Oral Daily   scopolamine  1 patch Transdermal Q72H   Continuous:  sodium chloride 5 mL/hr at 01/12/19 1930   ampicillin-sulbactam (UNASYN) IV Stopped (01/12/19 1836)   feeding supplement (VITAL 1.5 CAL) 50 mL/hr at 01/12/19 1459   propofol (DIPRIVAN) infusion Stopped (01/11/19 0727)   KKX:FGHWEX chloride, acetaminophen **OR** acetaminophen (TYLENOL) oral liquid 160 mg/5 mL **OR** acetaminophen, fentaNYL (SUBLIMAZE) injection, ipratropium-albuterol, labetalol, midazolam, midazolam, senna-docusate Anti-infectives (From admission, onward)   Start     Dose/Rate Route Frequency Ordered Stop   01/11/19 1300  Ampicillin-Sulbactam (UNASYN) 3 g in sodium chloride 0.9 % 100 mL IVPB     3 g 200 mL/hr over 30 Minutes Intravenous Every 6 hours 01/11/19 1233     01/10/19 1400  piperacillin-tazobactam (ZOSYN) IVPB 3.375 g  Status:  Discontinued     3.375 g 12.5 mL/hr over 240 Minutes Intravenous Every 8 hours 01/10/19 1038 01/11/19 1233   01/10/19 1200  vancomycin (VANCOCIN) IVPB 1000 mg/200 mL premix  Status:  Discontinued     1,000 mg 200 mL/hr over 60 Minutes Intravenous Every 12 hours 01/09/19 2254 01/10/19 1038   01/09/19 2345  Ampicillin-Sulbactam (UNASYN) 3 g in sodium chloride 0.9 % 100 mL IVPB  Status:  Discontinued     3 g 200 mL/hr over 30 Minutes Intravenous Every 6 hours 01/09/19 2314 01/10/19 1038   01/09/19 2300  ceFEPIme (MAXIPIME) 2 g in sodium chloride 0.9 % 100 mL IVPB  Status:  Discontinued     2 g 200 mL/hr over 30 Minutes Intravenous Every 8 hours 01/09/19 2254 01/09/19 2313   01/09/19 2300   vancomycin (VANCOCIN) 1,750 mg in sodium chloride 0.9 % 500 mL IVPB     1,750 mg 250 mL/hr over 120 Minutes Intravenous  Once 01/09/19 2254 01/10/19 0230     Scheduled Meds:  aspirin  162.5 mg Per Tube Daily   atorvastatin  80 mg Per Tube q1800   chlorhexidine gluconate (MEDLINE KIT)  15 mL Mouth Rinse BID   Chlorhexidine Gluconate Cloth  6 each Topical Daily   enoxaparin (LOVENOX) injection  40 mg Subcutaneous Q24H   feeding supplement (PRO-STAT SUGAR FREE 64)  30 mL Per Tube BID   mouth rinse  15 mL Mouth Rinse 10 times per day   pantoprazole sodium  40 mg Per Tube Daily   polyethylene glycol  17 g Oral Daily   scopolamine  1 patch Transdermal Q72H   Continuous Infusions:  sodium chloride 5 mL/hr at 01/12/19 1930   ampicillin-sulbactam (UNASYN) IV Stopped (01/12/19 1836)   feeding supplement (VITAL 1.5 CAL) 50 mL/hr at 01/12/19 1459   propofol (DIPRIVAN) infusion Stopped (01/11/19 0727)   PRN Meds:.sodium chloride, acetaminophen **OR** acetaminophen (TYLENOL) oral liquid 160 mg/5 mL **OR** acetaminophen, fentaNYL (SUBLIMAZE) injection, ipratropium-albuterol, labetalol, midazolam, midazolam, senna-docusate   Assessment: Active Problems:   Acute CVA (cerebrovascular accident) ([place])  Paul Leonard is a 58 y.o. male with recent diagnosis of meningioma resulting in left-sided weakness, admitted with worsening of weakness and altered mental status secondary to new brain infarcts, currently intubated due to hypoxic and hypercapnic respiratory failure.  Unresponsive, off sedation  Plan: Continue tube feeds as tolerated Continue bowel regimen Family is heading towards comfort care according to patient's nurse.  They will decide in next 1 to 2 days.    GI will sign off at this time, please call us back with questions or concerns    LOS:  3 days   Dann Ventress 01/12/2019, 8:25 PM

## 2019-01-13 LAB — CBC
HCT: 45.3 % (ref 39.0–52.0)
Hemoglobin: 14.3 g/dL (ref 13.0–17.0)
MCH: 28.4 pg (ref 26.0–34.0)
MCHC: 31.6 g/dL (ref 30.0–36.0)
MCV: 90.1 fL (ref 80.0–100.0)
Platelets: 308 10*3/uL (ref 150–400)
RBC: 5.03 MIL/uL (ref 4.22–5.81)
RDW: 13.2 % (ref 11.5–15.5)
WBC: 7.2 10*3/uL (ref 4.0–10.5)
nRBC: 0 % (ref 0.0–0.2)

## 2019-01-13 LAB — BASIC METABOLIC PANEL
Anion gap: 5 (ref 5–15)
BUN: 29 mg/dL — ABNORMAL HIGH (ref 6–20)
CO2: 30 mmol/L (ref 22–32)
Calcium: 8.4 mg/dL — ABNORMAL LOW (ref 8.9–10.3)
Chloride: 112 mmol/L — ABNORMAL HIGH (ref 98–111)
Creatinine, Ser: 1.03 mg/dL (ref 0.61–1.24)
GFR calc Af Amer: >60 mL/min (ref >60)
GFR calc non Af Amer: >60 mL/min (ref >60)
Glucose, Bld: 133 mg/dL — ABNORMAL HIGH (ref 70–99)
Potassium: 3.9 mmol/L (ref 3.5–5.1)
Sodium: 147 mmol/L — ABNORMAL HIGH (ref 135–145)

## 2019-01-13 LAB — GLUCOSE, CAPILLARY
Glucose-Capillary: 131 mg/dL — ABNORMAL HIGH (ref 70–99)
Glucose-Capillary: 119 mg/dL — ABNORMAL HIGH (ref 70–99)
Glucose-Capillary: 110 mg/dL — ABNORMAL HIGH (ref 70–99)
Glucose-Capillary: 92 mg/dL (ref 70–99)
Glucose-Capillary: 117 mg/dL — ABNORMAL HIGH (ref 70–99)

## 2019-01-13 LAB — PROTIME-INR
INR: 1.2 (ref 0.8–1.2)
Prothrombin Time: 14.9 seconds (ref 11.4–15.2)

## 2019-01-13 LAB — CULTURE, RESPIRATORY W GRAM STAIN
Culture: NORMAL
Special Requests: NORMAL

## 2019-01-13 LAB — URINE CULTURE: Culture: NO GROWTH

## 2019-01-13 LAB — TRIGLYCERIDES: Triglycerides: 61 mg/dL (ref <150)

## 2019-01-13 LAB — APTT: aPTT: 31 seconds (ref 24–36)

## 2019-01-13 LAB — FIBRINOGEN: Fibrinogen: 611 mg/dL — ABNORMAL HIGH (ref 210–475)

## 2019-01-13 MED ORDER — POLYETHYLENE GLYCOL 3350 17 G PO PACK: 17.0000 g | PACK | Freq: Every day | ORAL | Status: DC | PRN

## 2019-01-13 NOTE — Progress Notes (Signed)
[place] at [place] NAME: Kenry Reede    MR#:  [id number]  DATE OF BIRTH:  Jul 13, 1960  SUBJECTIVE:   Patient remains intubated.  He is unresponsive despite being off sedation. No acute events overnight.  REVIEW OF SYSTEMS:  ROS-unable to obtain, as patient is intubated and sedated  DRUG ALLERGIES:  No Known Allergies VITALS:  Blood pressure 114/80, pulse 81, temperature (!) 100.4 F (38 C), temperature source Oral, resp. rate 16, height 5\' 10"  (1.778 m), weight 76.9 kg, SpO2 98 %. PHYSICAL EXAMINATION:  Physical Exam  GENERAL:  Laying in the bed with no acute distress.  HEENT: Head atraumatic, normocephalic. Pupils equal, round, reactive to light and accommodation. No scleral icterus. Extraocular muscles intact. Oropharynx and nasopharynx clear. ETT in place. NECK:  Supple, no jugular venous distention. No thyroid enlargement. LUNGS: Lungs are clear to auscultation bilaterally. No wheezes, crackles, rhonchi. No use of accessory muscles of respiration.  CARDIOVASCULAR: RRR, S1, S2 normal. No murmurs, rubs, or gallops.  ABDOMEN: Soft, nontender, nondistended. Bowel sounds present.  EXTREMITIES: No pedal edema, cyanosis, or clubbing.  NEUROLOGIC: Unable to assess- intubated and sedated PSYCHIATRIC: Unable to assess SKIN: No obvious rash, lesion, or ulcer.  LABORATORY PANEL:  Male CBC Recent Labs  Lab 01/13/19 0526  WBC 7.2  HGB 14.3  HCT 45.3  PLT 308   ------------------------------------------------------------------------------------------------------------------ Chemistries  Recent Labs  Lab 01/09/19 2239  01/12/19 0327 01/13/19 0526  NA 140   < > 144 147*  K 3.3*   < > 3.8 3.9  CL 105   < > 109 112*  CO2 24   < > 27 30  GLUCOSE 92   < > 153* 133*  BUN 28*   < > 31* 29*  CREATININE 0.94   < > 0.99 1.03  CALCIUM 8.4*   < > 8.5* 8.4*  MG  --   --  2.3  --   AST 16  --   --   --   ALT 17  --   --   --   ALKPHOS  58  --   --   --   BILITOT 1.4*  --   --   --    < > = values in this interval not displayed.   RADIOLOGY:  No results found. 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 Remains unresponsive this morning. -Neurology following -EEG 10/28 was unremarkable -Continue aspirin -Palliative care consulted- family discussing their wishes for patient, leaning towards palliative extubation and full comfort care -Patient will need trach and PEG for survivial if family wishes to pursue aggressive medical intervention  Acute hypoxic and hypercapnic respiratory failure-secondary to aspiration pneumonia -Vent management per CCM -Continue unasyn -Blood and sputum cultures with no growth  Abnormal EKG- initial concern for STEMI, but cardiology feels that his EKG changes are more related to LVH. -Cardiology has signed off  Hypertension -Permissive hypertension for now -IV labetalol as needed  All the records are reviewed and case discussed with Care Management/Social Worker. Management plans discussed with the patient, family and they are in agreement.  CODE STATUS: Full Code  TOTAL TIME TAKING CARE OF THIS PATIENT: 36 minutes.   More than 50% of the time was spent in counseling/coordination of care: YES  POSSIBLE D/C unknown, DEPENDING ON CLINICAL CONDITION.   Berna Spare Angelea Penny M.D on 01/13/2019 at  2:51 PM  Between 7am to 6pm - Pager - [phone]  After 6pm go to [email or web address] - Proofreader  [facility]  Office  [phone]  CC: Primary care physician; Patient, No Pcp Per  Note: This dictation was prepared with Dragon dictation along with smaller phrase technology. Any transcriptional errors that result from this process are unintentional.

## 2019-01-13 NOTE — Progress Notes (Signed)
Pharmacy Electrolyte Monitoring Consult:  Pharmacy consulted to assist in monitoring and replacing electrolytes in this 58 y.o. male admitted on 12/15/2018. Patient currently in the ICU with acute hypoxic and hypercapnic respiratory failure from aspiration with progressive encephalopathy. 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   Labs:  Sodium (mmol/L)  Date Value  01/13/2019 147 (H)  10/10/2013 143   Potassium (mmol/L)  Date Value  01/13/2019 3.9  10/10/2013 3.8   Magnesium (mg/dL)  Date Value  01/12/2019 2.3   Phosphorus (mg/dL)  Date Value  01/12/2019 2.7   Calcium (mg/dL)  Date Value  01/13/2019 8.4 (L)   Calcium, Total (mg/dL)  Date Value  10/10/2013 8.6   Albumin (g/dL)  Date Value  01/09/2019 3.3 (L)   Corrected Calcium: 9.1 mg/dL  Assessment/Plan: 1. Electrolytes: no replacement warranted. Patient receiving Vital TF 1.5 at 30mL/hr. Will obtain follow up electrolytes with AM labs.   2. Glucose: Patient is having scheduled glucose checks. Will continue to monitor and add SSI as appropriate. BG 117-137 TG= 61 (on propofol)  3. Constipation: LBM 10/29. Will continue to [employer] daily.   Pharmacy will continue to monitor and adjust per consult.   Chinita Greenland PharmD Clinical Pharmacist 01/13/2019

## 2019-01-13 NOTE — Progress Notes (Signed)
Assisted tele visit to patient with family member.  Aamina Skiff Ann, RN  

## 2019-01-13 NOTE — Progress Notes (Signed)
Subjective: Patient unchanged.  Remains unresponsive.    Objective: Current vital signs: BP 125/82   Pulse 79   Temp 99.4 F (37.4 C) (Axillary)   Resp 16   Ht 5' 10"  (1.778 m)   Wt 76.9 kg   SpO2 97%   BMI 24.33 kg/m  Vital signs in last 24 hours: Temp:  [98.3 F (36.8 C)-101.7 F (38.7 C)] 99.4 F (37.4 C) (10/31 0400) Pulse Rate:  [73-97] 79 (10/31 0800) Resp:  [13-33] 16 (10/31 0800) BP: (100-156)/(75-135) 125/82 (10/31 0800) SpO2:  [95 %-100 %] 97 % (10/31 0800) FiO2 (%):  [28 %] 28 % (10/31 0749) Weight:  [76.9 kg] 76.9 kg (10/31 0328)  Intake/Output from previous day: 10/30 0701 - 10/31 0700 In: 1574.8 [I.V.:168.2; NG/GT:1099.2; IV Piggyback:307.5] Out: 760 [Urine:760] Intake/Output this shift: No intake/output data recorded. Nutritional status:  Diet Order            Diet NPO time specified  Diet effective now              Neurologic Exam: Mental Status: Patient does not respond to verbal stimuli.  Does not respond to deep sternal rub.  Does not follow commands.  No verbalizations noted.  Cranial Nerves: II: patient does not respond confrontation bilaterally, pupils right 3 mm, left 2 mm,and reactive bilaterally III,IV,VI: Oculocephalic response absent bilaterally.  V,VII: corneal reflex present bilaterally  VIII: patient does not respond to verbal stimuli IX,X: gag reflex absent, XI: trapezius strength unable to test bilaterally XII: tongue strength unable to test Motor: Extremities flaccid throughout.  No spontaneous movement noted.  No purposeful movements noted.   Lab Results: Basic Metabolic Panel: Recent Labs  Lab 01/09/19 2239 01/10/19 0131 01/11/19 0328 01/12/19 0327 01/13/19 0526  NA 140 142 143 144 147*  K 3.3* 3.4* 3.9 3.8 3.9  CL 105 107 110 109 112*  CO2 24 23 26 27 30   GLUCOSE 92 95 110* 153* 133*  BUN 28* 25* 28* 31* 29*  CREATININE 0.94 0.86 0.99 0.99 1.03  CALCIUM 8.4* 8.2* 8.5* 8.5* 8.4*  MG  --   --   --  2.3  --    PHOS  --   --   --  2.7  --     Liver Function Tests: Recent Labs  Lab 12/15/2018 1353 01/09/19 2239  AST 16 16  ALT 21 17  ALKPHOS 60 58  BILITOT 1.2 1.4*  PROT 7.0 6.3*  ALBUMIN 3.8 3.3*   No results for input(s): LIPASE, AMYLASE in the last 168 hours. No results for input(s): AMMONIA in the last 168 hours.  CBC: Recent Labs  Lab 01/11/2019 1353 01/09/19 2239 01/11/19 0328 01/13/19 0526  WBC 7.3 7.0 7.2 7.2  NEUTROABS 4.6  --   --   --   HGB 17.4* 16.8 15.5 14.3  HCT 53.7* 53.0* 49.5 45.3  MCV 87.7 89.5 89.8 90.1  PLT 353 320 288 308    Cardiac Enzymes: No results for input(s): CKTOTAL, CKMB, CKMBINDEX, TROPONINI in the last 168 hours.  Lipid Panel: Recent Labs  Lab 01/10/19 0131 01/10/19 0814 01/13/19 0801  CHOL 116  --   --   TRIG 71 74 61  HDL 28*  --   --   CHOLHDL 4.1  --   --   VLDL 14  --   --   LDLCALC 74  --   --     CBG: Recent Labs  Lab 01/12/19 1554 01/12/19 1911 01/12/19 2310 01/13/19  0325 01/13/19 0748  GLUCAP 120* 137* 133* 131* 119*    Microbiology: Results for orders placed or performed during the hospital encounter of 12/19/2018  SARS Coronavirus 2 by RT PCR (hospital order, performed in [facility] hospital lab) Nasopharyngeal Nasopharyngeal Swab     Status: None   Collection Time: 01/10/2019  1:51 PM   Specimen: Nasopharyngeal Swab  Result Value Ref Range Status   SARS Coronavirus 2 NEGATIVE NEGATIVE Final    Comment: (NOTE) If result is NEGATIVE SARS-CoV-2 target nucleic acids are NOT DETECTED. The SARS-CoV-2 RNA is generally detectable in upper and lower  respiratory specimens during the acute phase of infection. The lowest  concentration of SARS-CoV-2 viral copies this assay can detect is 250  copies / mL. A negative result does not preclude SARS-CoV-2 infection  and should not be used as the sole basis for treatment or other  patient management decisions.  A negative result may occur with  improper specimen collection /  handling, submission of specimen other  than nasopharyngeal swab, presence of viral mutation(s) within the  areas targeted by this assay, and inadequate number of viral copies  (<250 copies / mL). A negative result must be combined with clinical  observations, patient history, and epidemiological information. If result is POSITIVE SARS-CoV-2 target nucleic acids are DETECTED. The SARS-CoV-2 RNA is generally detectable in upper and lower  respiratory specimens dur ing the acute phase of infection.  Positive  results are indicative of active infection with SARS-CoV-2.  Clinical  correlation with patient history and other diagnostic information is  necessary to determine patient infection status.  Positive results do  not rule out bacterial infection or co-infection with other viruses. If result is PRESUMPTIVE POSTIVE SARS-CoV-2 nucleic acids MAY BE PRESENT.   A presumptive positive result was obtained on the submitted specimen  and confirmed on repeat testing.  While 2019 novel coronavirus  (SARS-CoV-2) nucleic acids may be present in the submitted sample  additional confirmatory testing may be necessary for epidemiological  and / or clinical management purposes  to differentiate between  SARS-CoV-2 and other Sarbecovirus currently known to infect humans.  If clinically indicated additional testing with an alternate test  methodology [phone]) is advised. The SARS-CoV-2 RNA is generally  detectable in upper and lower respiratory sp ecimens during the acute  phase of infection. The expected result is Negative. Fact Sheet for Patients:  StrictlyIdeas.no Fact Sheet for Healthcare Providers: BankingDealers.co.za This test is not yet approved or cleared by the [place] FDA and has been authorized for detection and/or diagnosis of SARS-CoV-2 by FDA under an Emergency Use Authorization (EUA).  This EUA will remain in effect (meaning this  test can be used) for the duration of the COVID-19 declaration under Section 564(b)(1) of the Act, 21 U.S.C. section 360bbb-3(b)(1), unless the authorization is terminated or revoked sooner. Performed at [facility], [address] [place], [place] [address]   Culture, blood (Routine X 2) w Reflex to ID Panel     Status: None (Preliminary result)   Collection Time: 01/09/19 10:38 PM   Specimen: BLOOD  Result Value Ref Range Status   Specimen Description BLOOD BLOOD LEFT HAND  Final   Special Requests   Final    BOTTLES DRAWN AEROBIC AND ANAEROBIC Blood Culture adequate volume   Culture   Final    NO GROWTH 4 DAYS Performed at [facility], [address], [place],  [address]    Report Status PENDING  Incomplete  Culture, blood (Routine X 2) w Reflex to ID Panel     Status: None (Preliminary result)   Collection Time: 01/09/19 10:39 PM   Specimen: BLOOD  Result Value Ref Range Status   Specimen Description BLOOD BLOOD LEFT WRIST  Final   Special Requests   Final    BOTTLES DRAWN AEROBIC AND ANAEROBIC Blood Culture adequate volume   Culture   Final    NO GROWTH 4 DAYS Performed at [facility], [address] [place], [place] [address]    Report Status PENDING  Incomplete  MRSA PCR Screening     Status: None   Collection Time: 01/09/19 11:21 PM   Specimen: Nasal Mucosa; Nasopharyngeal  Result Value Ref Range Status   MRSA by PCR NEGATIVE NEGATIVE Final    Comment:        The GeneXpert MRSA Assay (FDA approved for NASAL specimens only), is one component of a comprehensive MRSA colonization surveillance program. It is not intended to diagnose MRSA infection nor to guide or monitor treatment for MRSA infections. Performed at [facility], [address] [place], [place] [address]   Culture, respiratory (non-expectorated)     Status: None (Preliminary result)   Collection Time: 01/10/19  6:35 AM   Specimen: Tracheal  Aspirate; Respiratory  Result Value Ref Range Status   Specimen Description   Final    TRACHEAL ASPIRATE Performed at [facility], [address] [place], [place] [address]    Special Requests   Final    Normal Performed at [facility], [address] [place], [place] [address]    Gram Stain   Final    MODERATE WBC PRESENT, PREDOMINANTLY PMN NO ORGANISMS SEEN    Culture   Final    NO GROWTH 2 DAYS Performed at [facility], [place] [address], [place],  [address]    Report Status PENDING  Incomplete    Coagulation Studies: No results for input(s): LABPROT, INR in the last 72 hours.  Imaging: Ct Head Wo Contrast  Result Date: 01/11/2019 CLINICAL DATA:  Stroke. Left-sided weakness. Confusion and slurred speech. Multiple recent posterior circulation infarctions EXAM: CT HEAD WITHOUT CONTRAST TECHNIQUE: Contiguous axial images were obtained from the base of the skull through the vertex without intravenous contrast. COMPARISON:  01/10/2019 FINDINGS: Brain: No discernible change. Areas of low-density related to the acute infarctions in the right pons and midbrain, the right cerebellum and the right occipital lobe. No evidence of new acute infarction. No hemorrhage. No change and swelling. No hydrocephalus. 3.4 cm right parasagittal vertex meningioma is unchanged. Vascular: There is atherosclerotic calcification of the major vessels at the base of the brain. Skull: Negative Sinuses/Orbits: Clear/normal Other: None IMPRESSION: 1. No change since yesterday's study. Areas of low-density related to the acute infarctions in the right pons and midbrain, the right cerebellum and the right occipital lobe. No evidence of new infarction or hemorrhage. No hydrocephalus. 2. 3.4 cm right parasagittal vertex meningioma, unchanged. 3. Atherosclerosis of the major vessels at the base of the brain. Electronically Signed   By: Nelson Chimes M.D.   On: 01/11/2019 13:04     Medications:  I have reviewed the patient's current medications. Scheduled: . aspirin  162.5 mg Per Tube Daily  . atorvastatin  80 mg Per Tube q1800  . chlorhexidine gluconate (MEDLINE KIT)  15 mL Mouth Rinse BID  . Chlorhexidine Gluconate Cloth  6 each Topical Daily  . enoxaparin (LOVENOX) injection  40 mg  Subcutaneous Q24H  . feeding supplement (PRO-STAT SUGAR FREE 64)  30 mL Per Tube BID  . mouth rinse  15 mL Mouth Rinse 10 times per day  . pantoprazole sodium  40 mg Per Tube Daily  . polyethylene glycol  17 g Oral Daily  . 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 Intubated. On ASA.  Remains unresponsive.  Spoke with family on yesterday.  They wish to give the patient a few more days before making final decisions.    Will continue to follow with you.     LOS: 4 days   Alexis Goodell, MD Neurology [phone] 01/13/2019  9:43 AM

## 2019-01-13 NOTE — Consult Note (Signed)
Pharmacy Antibiotic Note  Paul Leonard is a 58 y.o. male admitted on 12/16/2018 with aspiration pneumonia.  Patient was on vancomycin and Unasyn on 10/27.   10/29: Due to the lack of culture growth, stable WBCs, and afebrile status, the team elected to de-escalate therapy to Unasyn. Zosyn was discontinued, along with vancomycin d/t MRSA PCR (-).   Plan: -Day 5 total abx-Day 3 Unasyn- Continue Unasyn 3g IV q6h  -Monitor renal function and s/sx of infections and adverse effects   Height: 5\' 10"  (177.8 cm) Weight: 169 lb 8.5 oz (76.9 kg) IBW/kg (Calculated) : 73  Temp (24hrs), Avg:99.4 F (37.4 C), Min:98.3 F (36.8 C), Max:101.7 F (38.7 C)  Recent Labs  Lab 01/12/2019 1353  01/09/19 2239 01/10/19 0131 01/11/19 0328 01/12/19 0327 01/13/19 0526  WBC 7.3  --  7.0  --  7.2  --  7.2  CREATININE 1.21   < > 0.94 0.86 0.99 0.99 1.03  LATICACIDVEN  --   --  1.5  --   --   --   --    < > = values in this interval not displayed.    Estimated Creatinine Clearance: 79.7 mL/min (by C-G formula based on SCr of 1.03 mg/dL).    No Known Allergies  Antimicrobials this admission: Vancomycin 10/27>>10/28 Unasyn 10/27>>10/28, 10/29 >> Zosyn 10/28>> 10/29  Microbiology results: 10/27 BCx: pending, no growth x 3 days 10/27 MRSA PCR: negative 10/26 SARs-CoV-2: negative  Thank you for allowing pharmacy to be a part of this patient's care.  Chinita Greenland PharmD Clinical Pharmacist 01/13/2019

## 2019-01-13 NOTE — Progress Notes (Signed)
CRITICAL CARE NOTE  CC  follow up CVA and respiratory failure   SUBJECTIVE Patient remains critically ill Prognosis is poor with regards of meaningful neurologic recovery Pt remains on a ventilator without sedation, obtunded ENT and GI have been consulted regarding trach and PEG placements -family considering potential withdrawal of support, still undecided.    BP 114/80   Pulse 81   Temp (!) 100.4 F (38 C) (Oral)   Resp 16   Ht 5\' 10"  (1.778 m)   Wt 76.9 kg   SpO2 98%   BMI 24.33 kg/m    I/O last 3 completed shifts: In: 1998.6 [I.V.:291.9; NG/GT:1099.2; IV Piggyback:607.5] Out: 1260 [Urine:1260] Total I/O In: 92.6 [IV Piggyback:92.6] Out: -   SpO2: 98 % O2 Flow Rate (L/min): 2 L/min FiO2 (%): 28 %   SIGNIFICANT EVENTS 10/26: Presented to ED with AMS after recent CVA. Found to have ST depression on EKG and new infarcts found on CT. Started on vanc and unasyn for possible aspiration pneumonia. Admitted to telemetry unit 10/27: Transferred to ICU for unresponsiveness 10/28: Intubated 10/29: Pt remains intubated. Plavix held for possible procedures.  10/30: Pt remains on a ventilator at an FiO2 of 28% without sedation. Vancomycin has been stopped and unasyn continued. Pt has been tolerating OG tube feeds. Urine tox was negative yesterday. Head CT showed no changes. Palliative consulted, family making decision today regarding next step. GI and ENT have been consulted about possible PEG and trach placements.  10/31: No change in neuro status, obtunded  REVIEW OF SYSTEMS: Unable to obtain due to severe critical illness and obtundation.  PHYSICAL EXAMINATION:  GENERAL: Obtunded (no sedation), synchronous with the ventilator HEAD: Normocephalic, atraumatic.  EYES: Pupils equal, round, reactive to light.  No scleral icterus.  MOUTH: Endotracheal tube in place.  Copious oral secretions though more manageable today.  Some bloody oral secretions. NECK: Supple.  Trachea  midline. PULMONARY: + Diffuse rhonchi, no wheezing CARDIOVASCULAR: S1 and S2. Regular rate and rhythm. No murmurs, rubs, or gallops.  GASTROINTESTINAL: Soft,non-distended. No masses. Positive bowel sounds. MUSCULOSKELETAL: No swelling, clubbing, or edema.  NEUROLOGIC: obtunded, GCS 3.   SKIN:intact,warm,dry GU: Hematuria noted  MEDICATIONS: I have reviewed all medications and confirmed regimen as documented   CULTURE RESULTS   Recent Results (from the past 240 hour(s))  SARS Coronavirus 2 by RT PCR (hospital order, performed in [facility] hospital lab) Nasopharyngeal Nasopharyngeal Swab     Status: None   Collection Time: 01/13/2019  1:51 PM   Specimen: Nasopharyngeal Swab  Result Value Ref Range Status   SARS Coronavirus 2 NEGATIVE NEGATIVE Final    Comment: (NOTE) If result is NEGATIVE SARS-CoV-2 target nucleic acids are NOT DETECTED. The SARS-CoV-2 RNA is generally detectable in upper and lower  respiratory specimens during the acute phase of infection. The lowest  concentration of SARS-CoV-2 viral copies this assay can detect is 250  copies / mL. A negative result does not preclude SARS-CoV-2 infection  and should not be used as the sole basis for treatment or other  patient management decisions.  A negative result may occur with  improper specimen collection / handling, submission of specimen other  than nasopharyngeal swab, presence of viral mutation(s) within the  areas targeted by this assay, and inadequate number of viral copies  (<250 copies / mL). A negative result must be combined with clinical  observations, patient history, and epidemiological information. If result is POSITIVE SARS-CoV-2 target nucleic acids are DETECTED. The SARS-CoV-2 RNA is generally  detectable in upper and lower  respiratory specimens dur ing the acute phase of infection.  Positive  results are indicative of active infection with SARS-CoV-2.  Clinical  correlation with patient history and  other diagnostic information is  necessary to determine patient infection status.  Positive results do  not rule out bacterial infection or co-infection with other viruses. If result is PRESUMPTIVE POSTIVE SARS-CoV-2 nucleic acids MAY BE PRESENT.   A presumptive positive result was obtained on the submitted specimen  and confirmed on repeat testing.  While 2019 novel coronavirus  (SARS-CoV-2) nucleic acids may be present in the submitted sample  additional confirmatory testing may be necessary for epidemiological  and / or clinical management purposes  to differentiate between  SARS-CoV-2 and other Sarbecovirus currently known to infect humans.  If clinically indicated additional testing with an alternate test  methodology [phone]) is advised. The SARS-CoV-2 RNA is generally  detectable in upper and lower respiratory sp ecimens during the acute  phase of infection. The expected result is Negative. Fact Sheet for Patients:  StrictlyIdeas.no Fact Sheet for Healthcare Providers: BankingDealers.co.za This test is not yet approved or cleared by the [place] FDA and has been authorized for detection and/or diagnosis of SARS-CoV-2 by FDA under an Emergency Use Authorization (EUA).  This EUA will remain in effect (meaning this test can be used) for the duration of the COVID-19 declaration under Section 564(b)(1) of the Act, 21 U.S.C. section 360bbb-3(b)(1), unless the authorization is terminated or revoked sooner. Performed at [facility], [address] [place], [place] [address]   Culture, blood (Routine X 2) w Reflex to ID Panel     Status: None (Preliminary result)   Collection Time: 01/09/19 10:38 PM   Specimen: BLOOD  Result Value Ref Range Status   Specimen Description BLOOD BLOOD LEFT HAND  Final   Special Requests   Final    BOTTLES DRAWN AEROBIC AND ANAEROBIC Blood Culture adequate volume   Culture   Final    NO  GROWTH 4 DAYS Performed at [facility], [address] [place], [place] [address]    Report Status PENDING  Incomplete  Culture, blood (Routine X 2) w Reflex to ID Panel     Status: None (Preliminary result)   Collection Time: 01/09/19 10:39 PM   Specimen: BLOOD  Result Value Ref Range Status   Specimen Description BLOOD BLOOD LEFT WRIST  Final   Special Requests   Final    BOTTLES DRAWN AEROBIC AND ANAEROBIC Blood Culture adequate volume   Culture   Final    NO GROWTH 4 DAYS Performed at [facility], [address] [place], [place] [address]    Report Status PENDING  Incomplete  MRSA PCR Screening     Status: None   Collection Time: 01/09/19 11:21 PM   Specimen: Nasal Mucosa; Nasopharyngeal  Result Value Ref Range Status   MRSA by PCR NEGATIVE NEGATIVE Final    Comment:        The GeneXpert MRSA Assay (FDA approved for NASAL specimens only), is one component of a comprehensive MRSA colonization surveillance program. It is not intended to diagnose MRSA infection nor to guide or monitor treatment for MRSA infections. Performed at [facility], [address] [place], [place] [address]   Culture, respiratory (non-expectorated)     Status: None   Collection Time: 01/10/19  6:35 AM   Specimen: Tracheal Aspirate; Respiratory  Result Value Ref Range Status   Specimen Description  Final    TRACHEAL ASPIRATE Performed at [facility], [address] [place], [place] [address]    Special Requests   Final    Normal Performed at [facility], [address] [place], [place] [address]    Gram Stain   Final    MODERATE WBC PRESENT, PREDOMINANTLY PMN NO ORGANISMS SEEN    Culture   Final    RARE Consistent with normal respiratory flora. Performed at [facility], [place] [address], [place], [place] [address]    Report Status 01/13/2019 FINAL  Final  Urine Culture     Status: None   Collection Time: 01/12/19 10:58 AM    Specimen: Urine, Random  Result Value Ref Range Status   Specimen Description   Final    URINE, RANDOM Performed at [facility], [address], [place], [place] [address]    Special Requests   Final    NONE Performed at [facility], [address], [place], [place] [address]    Culture   Final    NO GROWTH Performed at [facility], [place] [address], [place], [place] [address]    Report Status 01/13/2019 FINAL  Final          IMAGING    No results found.     Indwelling Urinary Catheter continued, requirement due to   Reason to continue Indwelling Urinary Catheter strict Intake/Output monitoring for hemodynamic instability   Central Line/ continued, requirement due to  Reason to continue [place] of central venous pressure or other hemodynamic parameters and poor IV access   Ventilator continued, requirement due to obtundation,  inability to protect airway, copious secretions   Ventilator Sedation  on no sedation currently, obtunded      ASSESSMENT AND PLAN SYNOPSIS   Severe ACUTE Hypoxic and Hypercapnic Respiratory Failure -continue Full MV support -continue Bronchodilator Therapy -Wean Fio2 and PEEP as tolerated -failure to wean from ventilator due to obtundation, inability to protect airway -ENT consulted about possible trach placement however, patient's family may opt for withdrawal of support after discussion with Palliative Care and Neurology yesterday.  NEUROLOGIC - intubated, obtunded - Currently on no sedation Wake up assessment: No response off of sedatives, GCS 3.  CARDIAC -ICU monitoring -Continue to hold plavix -Continue permissive HTN   ID -continue IV abx as prescibed - continue unasyn  -follow up cultures -Consider DC Unasyn if cultures negative by morning -Urine culture yesterday due to hematuria  GI -GI PROPHYLAXIS: PPI   NUTRITIONAL STATUS DIET-->TF's as tolerated Constipation  protocol as indicated  ENDO - will use ICU hypoglycemic\Hyperglycemia protocol if indicated  HEME -Hematuria, persistent.  Bloody oral secretions -Possibly developing DIC due to CNS injury, check coags -Stop heparin subcu and aspirin for now   ELECTROLYTES -follow labs as needed -replace as needed -pharmacy consultation and following   DVT/GI PRX ordered TRANSFUSIONS AS NEEDED MONITOR FSBS ASSESS the need for LABS as needed   Critical Care Time devoted to patient care services described in this note is 40 minutes.   Overall, patient is critically ill, prognosis is guarded.  Palliative care and neurology conferred with family yesterday.  They are entertaining potential extubation to comfort measures.  This would be reasonable given the patient's poor prognosis with regards to his neurologic insult.   Renold Don, MD [facility]

## 2019-01-14 DIAGNOSIS — I6302 Cerebral infarction due to thrombosis of basilar artery: Secondary | ICD-10-CM

## 2019-01-14 DIAGNOSIS — R402434 Glasgow coma scale score 3-8, 24 hours or more after hospital admission: Secondary | ICD-10-CM

## 2019-01-14 LAB — CBC
HCT: 45.6 % (ref 39.0–52.0)
Hemoglobin: 14.1 g/dL (ref 13.0–17.0)
MCH: 28.7 pg (ref 26.0–34.0)
MCHC: 30.9 g/dL (ref 30.0–36.0)
MCV: 92.9 fL (ref 80.0–100.0)
Platelets: 310 10*3/uL (ref 150–400)
RBC: 4.91 MIL/uL (ref 4.22–5.81)
RDW: 13.2 % (ref 11.5–15.5)
WBC: 8.8 10*3/uL (ref 4.0–10.5)
nRBC: 0 % (ref 0.0–0.2)

## 2019-01-14 LAB — RENAL FUNCTION PANEL
Albumin: 2.8 g/dL — ABNORMAL LOW (ref 3.5–5.0)
Anion gap: 7 (ref 5–15)
BUN: 28 mg/dL — ABNORMAL HIGH (ref 6–20)
CO2: 31 mmol/L (ref 22–32)
Calcium: 8.5 mg/dL — ABNORMAL LOW (ref 8.9–10.3)
Chloride: 111 mmol/L (ref 98–111)
Creatinine, Ser: 0.81 mg/dL (ref 0.61–1.24)
GFR calc Af Amer: >60 mL/min (ref >60)
GFR calc non Af Amer: >60 mL/min (ref >60)
Glucose, Bld: 132 mg/dL — ABNORMAL HIGH (ref 70–99)
Phosphorus: 3.6 mg/dL (ref 2.5–4.6)
Potassium: 3.6 mmol/L (ref 3.5–5.1)
Sodium: 149 mmol/L — ABNORMAL HIGH (ref 135–145)

## 2019-01-14 LAB — CULTURE, BLOOD (ROUTINE X 2)
Culture: NO GROWTH
Culture: NO GROWTH
Special Requests: ADEQUATE
Special Requests: ADEQUATE

## 2019-01-14 LAB — GLUCOSE, CAPILLARY
Glucose-Capillary: 110 mg/dL — ABNORMAL HIGH (ref 70–99)
Glucose-Capillary: 112 mg/dL — ABNORMAL HIGH (ref 70–99)
Glucose-Capillary: 119 mg/dL — ABNORMAL HIGH (ref 70–99)
Glucose-Capillary: 120 mg/dL — ABNORMAL HIGH (ref 70–99)
Glucose-Capillary: 125 mg/dL — ABNORMAL HIGH (ref 70–99)
Glucose-Capillary: 127 mg/dL — ABNORMAL HIGH (ref 70–99)
Glucose-Capillary: 135 mg/dL — ABNORMAL HIGH (ref 70–99)
Glucose-Capillary: 97 mg/dL (ref 70–99)

## 2019-01-14 MED ORDER — FUROSEMIDE 10 MG/ML IJ SOLN: 20.0000 mg | Freq: Once | INTRAMUSCULAR | Status: DC

## 2019-01-14 NOTE — Consult Note (Signed)
Pharmacy Antibiotic Note  Paul Leonard is a 58 y.o. male admitted on 01/10/2019 with aspiration pneumonia.  Patient was on vancomycin and Unasyn on 10/27.   10/29: Due to the lack of culture growth, stable WBCs, and afebrile status, the team elected to de-escalate therapy to Unasyn. Zosyn was discontinued, along with vancomycin d/t MRSA PCR (-).   Plan: -Day 6 total abx-Day 4 Unasyn- Continue Unasyn 3g IV q6h  -Monitor renal function and s/sx of infections and adverse effects   Height: 5\' 10"  (177.8 cm) Weight: 165 lb 5.5 oz (75 kg) IBW/kg (Calculated) : 73  Temp (24hrs), Avg:100 F (37.8 C), Min:99.2 F (37.3 C), Max:101.5 F (38.6 C)  Recent Labs  Lab 01/04/2019 1353  01/09/19 2239 01/10/19 0131 01/11/19 0328 01/12/19 0327 01/13/19 0526 01/14/19 0414  WBC 7.3  --  7.0  --  7.2  --  7.2 8.8  CREATININE 1.21   < > 0.94 0.86 0.99 0.99 1.03 0.81  LATICACIDVEN  --   --  1.5  --   --   --   --   --    < > = values in this interval not displayed.    Estimated Creatinine Clearance: 101.4 mL/min (by C-G formula based on SCr of 0.81 mg/dL).    No Known Allergies  Antimicrobials this admission: Vancomycin 10/27>>10/28 Unasyn 10/27>>10/28, 10/29 >> Zosyn 10/28>> 10/29  Microbiology results: 10/27 BCx: pending, no growth x 3 days 10/27 MRSA PCR: negative 10/26 SARs-CoV-2: negative  Thank you for allowing pharmacy to be a part of this patient's care.  Chinita Greenland PharmD Clinical Pharmacist 01/14/2019

## 2019-01-14 NOTE — Progress Notes (Signed)
Pt has remained in NSR throughout shift. pts family visited through [place]. Pt has been tolerating vent well producing a lot of secretions. Pt currently resting comfortably. No acute changes.

## 2019-01-14 NOTE — Progress Notes (Signed)
Pharmacy Electrolyte Monitoring Consult:  Pharmacy consulted to assist in monitoring and replacing electrolytes in this 58 y.o. male admitted on 12/31/2018. Patient currently in the ICU with acute hypoxic and hypercapnic respiratory failure from aspiration with progressive encephalopathy. 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   Labs:  Sodium (mmol/L)  Date Value  01/14/2019 149 (H)  10/10/2013 143   Potassium (mmol/L)  Date Value  01/14/2019 3.6  10/10/2013 3.8   Magnesium (mg/dL)  Date Value  01/12/2019 2.3   Phosphorus (mg/dL)  Date Value  01/14/2019 3.6   Calcium (mg/dL)  Date Value  01/14/2019 8.5 (L)   Calcium, Total (mg/dL)  Date Value  10/10/2013 8.6   Albumin (g/dL)  Date Value  01/14/2019 2.8 (L)   Corrected Calcium: 8.5  Alb 2.8  Assessment/Plan: 1. Electrolytes: no replacement warranted. Patient receiving Vital TF 1.5 at 25mL/hr. Will obtain follow up electrolytes with AM labs.   2. Glucose: Patient is having scheduled glucose checks. Will continue to monitor and add SSI as appropriate. BG 92-135  3. Constipation: LBM 10/31. Will continue to monitor-on Miralax daily prn and senna-S bedtime prn.   Pharmacy will continue to monitor and adjust per consult.   Chinita Greenland PharmD Clinical Pharmacist 01/14/2019

## 2019-01-14 NOTE — Progress Notes (Signed)
Assisted tele visit to patient with family member.  Jelina Paulsen Ann, RN  

## 2019-01-14 NOTE — Progress Notes (Signed)
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    BP 133/90   Pulse 76   Temp 99.8 F (37.7 C) (Oral)   Resp 16   Ht 5\' 10"  (1.778 m)   Wt 75 kg   SpO2 98%   BMI 23.72 kg/m    I/O last 3 completed shifts: In: 1373.9 [I.V.:62; NG/GT:900.8; IV Piggyback:411.1] Out: 1515 [Urine:1215; Stool:300] Total I/O In: 1185.9 [NG/GT:897; IV Piggyback:288.9] Out: 330 [Urine:330]  SpO2: 98 % O2 Flow Rate (L/min): 2 L/min FiO2 (%): 28 %   SIGNIFICANT EVENTS 10/26: Presented to ED with AMS after recent CVA. Found to have ST depression on EKG and new infarcts found on CT. Started on vanc and unasyn for possible aspiration pneumonia. Admitted to telemetry unit 10/27: Transferred to ICU for unresponsiveness 10/28: Intubated 10/29: Pt remains intubated. Plavix held for possible procedures.  10/30: Pt remains on a ventilator at an FiO2 of 28% without sedation. Vancomycin has been stopped and unasyn continued. Pt has been tolerating OG tube feeds. Urine tox was negative yesterday. Head CT showed no changes. Palliative consulted, family making decision today regarding next step. GI and ENT have been consulted about possible PEG and trach placements.  10/31: No change in neuro status, obtunded.  Some hematuria, mucosal membrane oozing.  Anticoagulants/antiplatelets discontinued. 11/01: Remains comatose  REVIEW OF SYSTEMS: Unable to obtain due to severe critical illness and comatose state  PHYSICAL EXAMINATION:  GENERAL: Comatose (no sedation), synchronous with the ventilator HEAD: Normocephalic, atraumatic.  EYES:  Pupils equal, round, reactive to light.  No scleral icterus.  MOUTH: Endotracheal tube in place.  Copious oral secretions though more manageable today.  No more bloody oral secretions. NECK: Supple.  Trachea midline. PULMONARY: + Coarse breath sounds, no wheezing CARDIOVASCULAR: S1 and S2. Regular rate and rhythm. No murmurs, rubs, or gallops.  GASTROINTESTINAL: Soft,non-distended. No masses. Positive bowel sounds. MUSCULOSKELETAL: No swelling, clubbing, or edema.  NEUROLOGIC: obtunded, GCS 3.   SKIN:intact,warm,dry GU: Hematuria noted, less pronounced  MEDICATIONS: I have reviewed all medications and confirmed regimen as documented   CULTURE RESULTS   Recent Results (from the past 240 hour(s))  SARS Coronavirus 2 by RT PCR (hospital order, performed in [facility] hospital lab) Nasopharyngeal Nasopharyngeal Swab     Status: None   Collection Time: 01/05/2019  1:51 PM   Specimen: Nasopharyngeal Swab  Result Value Ref Range Status   SARS Coronavirus 2 NEGATIVE NEGATIVE Final    Comment: (NOTE) If result is NEGATIVE SARS-CoV-2 target nucleic acids are NOT DETECTED. The SARS-CoV-2 RNA is generally detectable in upper and lower  respiratory specimens during the acute phase of infection. The lowest  concentration of SARS-CoV-2 viral copies this assay can detect is 250  copies / mL. A negative result does not preclude SARS-CoV-2 infection  and should not be used as the sole basis for treatment or other  patient management decisions.  A negative result may occur with  improper specimen collection / handling, submission of specimen other  than nasopharyngeal swab, presence of viral mutation(s) within the  areas targeted by this assay, and inadequate number of viral copies  (<250 copies / mL). A negative result  must be combined with clinical  observations, patient history, and epidemiological information. If result is POSITIVE SARS-CoV-2 target nucleic acids are DETECTED. The SARS-CoV-2 RNA  is generally detectable in upper and lower  respiratory specimens dur ing the acute phase of infection.  Positive  results are indicative of active infection with SARS-CoV-2.  Clinical  correlation with patient history and other diagnostic information is  necessary to determine patient infection status.  Positive results do  not rule out bacterial infection or co-infection with other viruses. If result is PRESUMPTIVE POSTIVE SARS-CoV-2 nucleic acids MAY BE PRESENT.   A presumptive positive result was obtained on the submitted specimen  and confirmed on repeat testing.  While 2019 novel coronavirus  (SARS-CoV-2) nucleic acids may be present in the submitted sample  additional confirmatory testing may be necessary for epidemiological  and / or clinical management purposes  to differentiate between  SARS-CoV-2 and other Sarbecovirus currently known to infect humans.  If clinically indicated additional testing with an alternate test  methodology [phone]) is advised. The SARS-CoV-2 RNA is generally  detectable in upper and lower respiratory sp ecimens during the acute  phase of infection. The expected result is Negative. Fact Sheet for Patients:  StrictlyIdeas.no Fact Sheet for Healthcare Providers: BankingDealers.co.za This test is not yet approved or cleared by the [place] FDA and has been authorized for detection and/or diagnosis of SARS-CoV-2 by FDA under an Emergency Use Authorization (EUA).  This EUA will remain in effect (meaning this test can be used) for the duration of the COVID-19 declaration under Section 564(b)(1) of the Act, 21 U.S.C. section 360bbb-3(b)(1), unless the authorization is terminated or revoked sooner. Performed at [facility], [address] [place], [place] [address]   Culture, blood (Routine X 2) w Reflex to ID Panel     Status: None   Collection Time: 01/09/19 10:38 PM   Specimen: BLOOD   Result Value Ref Range Status   Specimen Description BLOOD BLOOD LEFT HAND  Final   Special Requests   Final    BOTTLES DRAWN AEROBIC AND ANAEROBIC Blood Culture adequate volume   Culture   Final    NO GROWTH 5 DAYS Performed at [facility], [address] [place], [place] [address]    Report Status 01/14/2019 FINAL  Final  Culture, blood (Routine X 2) w Reflex to ID Panel     Status: None   Collection Time: 01/09/19 10:39 PM   Specimen: BLOOD  Result Value Ref Range Status   Specimen Description BLOOD BLOOD LEFT WRIST  Final   Special Requests   Final    BOTTLES DRAWN AEROBIC AND ANAEROBIC Blood Culture adequate volume   Culture   Final    NO GROWTH 5 DAYS Performed at [facility], [address] [place], [place] [address]    Report Status 01/14/2019 FINAL  Final  MRSA PCR Screening     Status: None   Collection Time: 01/09/19 11:21 PM   Specimen: Nasal Mucosa; Nasopharyngeal  Result Value Ref Range Status   MRSA by PCR NEGATIVE NEGATIVE Final    Comment:        The GeneXpert MRSA Assay (FDA approved for NASAL specimens only), is one component of a comprehensive MRSA colonization surveillance program. It is not intended to diagnose MRSA infection nor to guide or monitor treatment for MRSA infections. Performed at [facility], [address] [place], [place] [address]   Culture, respiratory (non-expectorated)     Status:  None   Collection Time: 01/10/19  6:35 AM   Specimen: Tracheal Aspirate; Respiratory  Result Value Ref Range Status   Specimen Description   Final    TRACHEAL ASPIRATE Performed at [facility], [address] [place], [place] [address]    Special Requests   Final    Normal Performed at [facility], [address] [place], [place] [address]    Gram Stain   Final    MODERATE WBC PRESENT, PREDOMINANTLY PMN NO ORGANISMS SEEN    Culture   Final    RARE Consistent with normal respiratory  flora. Performed at [facility], [place] [address] [place], [place] [address]    Report Status 01/13/2019 FINAL  Final  Urine Culture     Status: None   Collection Time: 01/12/19 10:58 AM   Specimen: Urine, Random  Result Value Ref Range Status   Specimen Description   Final    URINE, RANDOM Performed at [facility], [address], [place], [place] [address]    Special Requests   Final    NONE Performed at [facility], [address] [place], [place] [address]    Culture   Final    NO GROWTH Performed at [facility], [place] [address], [place], [place] [address]    Report Status 01/13/2019 FINAL  Final          IMAGING    No results found.  No recent imaging.     Indwelling Urinary Catheter continued, requirement due to   Reason to continue Indwelling Urinary Catheter strict Intake/Output monitoring for hemodynamic instability   Central Line/ continued, requirement due to  Reason to continue [place] of central venous pressure or other hemodynamic parameters and poor IV access   Ventilator continued, requirement due to obtundation,  inability to protect airway, copious secretions   Ventilator Sedation  comatose, no sedation      ASSESSMENT AND PLAN SYNOPSIS   Severe ACUTE Hypoxic and Hypercapnic Respiratory Failure -continue Full MV support -continue Bronchodilator Therapy -Wean Fio2 and PEEP as tolerated -failure to wean from ventilator due to comatose state, inability to protect airway -ENT consulted about possible trach placement however, patient's family may opt for withdrawal of support after discussion with Palliative Care and Neurology 10/30.  Readdress on 11/2.  NEUROLOGIC - intubated, comatose - Currently on no sedation Wake up assessment: No response off of sedatives, GCS 3.  CARDIAC -ICU monitoring -Continue permissive HTN   ID -continue IV abx as prescibed - continue unasyn  -follow up  cultures -DC Unasyn as cultures have been negative -Urine culture 10/30 due to hematuria, no growth  GI -GI PROPHYLAXIS: PPI   NUTRITIONAL STATUS DIET-->TF's as tolerated Constipation protocol as indicated  ENDO - will use ICU hypoglycemic\Hyperglycemia protocol if indicated  HEME -Hematuria, subsiding.  Bloody oral secretions improved -Possibly developing DIC due to CNS injury, coags normal fibrinogen elevated. -Continue to withhold heparin subcu and aspirin for now, SCDs.   ELECTROLYTES -follow labs as needed -replace as needed -pharmacy consultation and following   DVT/GI PRX ordered TRANSFUSIONS AS NEEDED MONITOR FSBS ASSESS the need for LABS as needed   Critical Care Time devoted to patient care services described in this note is 35 minutes.   Overall, patient is critically ill, prognosis is poor particularly in regards to neurologic recovery palliative care and neurology conferred with family 10/30.  They are entertaining potential extubation to comfort measures.  This would  be reasonable given the patient's poor prognosis with regards to his neurologic insult.   Renold Don, MD [facility]

## 2019-01-14 NOTE — Progress Notes (Addendum)
Subjective: Patient unchanged.  Remains unresponsive on no sedation.    Objective: Current vital signs: BP 134/79   Pulse 79   Temp 99.5 F (37.5 C) (Oral)   Resp 16   Ht 5' 10"  (1.778 m)   Wt 75 kg   SpO2 98%   BMI 23.72 kg/m  Vital signs in last 24 hours: Temp:  [99.2 F (37.3 C)-101.5 F (38.6 C)] 99.5 F (37.5 C) (11/01 0700) Pulse Rate:  [73-90] 79 (11/01 0700) Resp:  [16-19] 16 (11/01 0700) BP: (113-174)/(77-96) 134/79 (11/01 0700) SpO2:  [96 %-100 %] 98 % (11/01 0700) FiO2 (%):  [28 %] 28 % (11/01 0407) Weight:  [75 kg] 75 kg (11/01 0331)  Intake/Output from previous day: 10/31 0701 - 11/01 0700 In: 1104.4 [NG/GT:900.8; IV Piggyback:203.6] Out: 1015 [Urine:715; Stool:300] Intake/Output this shift: No intake/output data recorded. Nutritional status:  Diet Order            Diet NPO time specified  Diet effective now              Neurologic Exam: Mental Status: Patient does not respond to verbal stimuli. Does not respond to deep sternal rub. Does not follow commands. No verbalizations noted.  Cranial Nerves: II: patient does not respond confrontation bilaterally, pupils right58m, left 222mand reactivebilaterally III,IV,VI: Oculocephalic response absent bilaterally.  V,VII: corneal reflexpresentbilaterally VIII: patient does not respond to verbal stimuli IX,X: gag reflexabsent, XI: trapezius strength unable to test bilaterally XII: tongue strength unable to test Motor: Extremities flaccid throughout. No spontaneous movement noted. No purposeful movements noted.   Lab Results: Basic Metabolic Panel: Recent Labs  Lab 01/10/19 0131 01/11/19 0328 01/12/19 0327 01/13/19 0526 01/14/19 0414  NA 142 143 144 147* 149*  K 3.4* 3.9 3.8 3.9 3.6  CL 107 110 109 112* 111  CO2 23 26 27 30 31   GLUCOSE 95 110* 153* 133* 132*  BUN 25* 28* 31* 29* 28*  CREATININE 0.86 0.99 0.99 1.03 0.81  CALCIUM 8.2* 8.5* 8.5* 8.4* 8.5*  MG  --   --  2.3  --    --   PHOS  --   --  2.7  --  3.6    Liver Function Tests: Recent Labs  Lab 12/28/2018 1353 01/09/19 2239 01/14/19 0414  AST 16 16  --   ALT 21 17  --   ALKPHOS 60 58  --   BILITOT 1.2 1.4*  --   PROT 7.0 6.3*  --   ALBUMIN 3.8 3.3* 2.8*   No results for input(s): LIPASE, AMYLASE in the last 168 hours. No results for input(s): AMMONIA in the last 168 hours.  CBC: Recent Labs  Lab 12/30/2018 1353 01/09/19 2239 01/11/19 0328 01/13/19 0526 01/14/19 0414  WBC 7.3 7.0 7.2 7.2 8.8  NEUTROABS 4.6  --   --   --   --   HGB 17.4* 16.8 15.5 14.3 14.1  HCT 53.7* 53.0* 49.5 45.3 45.6  MCV 87.7 89.5 89.8 90.1 92.9  PLT 353 320 288 308 310    Cardiac Enzymes: No results for input(s): CKTOTAL, CKMB, CKMBINDEX, TROPONINI in the last 168 hours.  Lipid Panel: Recent Labs  Lab 01/10/19 0131 01/10/19 0814 01/13/19 0801  CHOL 116  --   --   TRIG 71 74 61  HDL 28*  --   --   CHOLHDL 4.1  --   --   VLDL 14  --   --   LDLCALC 74  --   --  CBG: Recent Labs  Lab 01/13/19 1759 01/13/19 2011 01/13/19 2352 01/14/19 0336 01/14/19 0714  GLUCAP 92 117* 125* 112* 135*    Microbiology: Results for orders placed or performed during the hospital encounter of 12/14/2018  SARS Coronavirus 2 by RT PCR (hospital order, performed in [facility] hospital lab) Nasopharyngeal Nasopharyngeal Swab     Status: None   Collection Time: 01/11/2019  1:51 PM   Specimen: Nasopharyngeal Swab  Result Value Ref Range Status   SARS Coronavirus 2 NEGATIVE NEGATIVE Final    Comment: (NOTE) If result is NEGATIVE SARS-CoV-2 target nucleic acids are NOT DETECTED. The SARS-CoV-2 RNA is generally detectable in upper and lower  respiratory specimens during the acute phase of infection. The lowest  concentration of SARS-CoV-2 viral copies this assay can detect is 250  copies / mL. A negative result does not preclude SARS-CoV-2 infection  and should not be used as the sole basis for treatment or other  patient  management decisions.  A negative result may occur with  improper specimen collection / handling, submission of specimen other  than nasopharyngeal swab, presence of viral mutation(s) within the  areas targeted by this assay, and inadequate number of viral copies  (<250 copies / mL). A negative result must be combined with clinical  observations, patient history, and epidemiological information. If result is POSITIVE SARS-CoV-2 target nucleic acids are DETECTED. The SARS-CoV-2 RNA is generally detectable in upper and lower  respiratory specimens dur ing the acute phase of infection.  Positive  results are indicative of active infection with SARS-CoV-2.  Clinical  correlation with patient history and other diagnostic information is  necessary to determine patient infection status.  Positive results do  not rule out bacterial infection or co-infection with other viruses. If result is PRESUMPTIVE POSTIVE SARS-CoV-2 nucleic acids MAY BE PRESENT.   A presumptive positive result was obtained on the submitted specimen  and confirmed on repeat testing.  While 2019 novel coronavirus  (SARS-CoV-2) nucleic acids may be present in the submitted sample  additional confirmatory testing may be necessary for epidemiological  and / or clinical management purposes  to differentiate between  SARS-CoV-2 and other Sarbecovirus currently known to infect humans.  If clinically indicated additional testing with an alternate test  methodology [phone]) is advised. The SARS-CoV-2 RNA is generally  detectable in upper and lower respiratory sp ecimens during the acute  phase of infection. The expected result is Negative. Fact Sheet for Patients:  StrictlyIdeas.no Fact Sheet for Healthcare Providers: BankingDealers.co.za This test is not yet approved or cleared by the [place] FDA and has been authorized for detection and/or diagnosis of SARS-CoV-2 by FDA under  an Emergency Use Authorization (EUA).  This EUA will remain in effect (meaning this test can be used) for the duration of the COVID-19 declaration under Section 564(b)(1) of the Act, 21 U.S.C. section 360bbb-3(b)(1), unless the authorization is terminated or revoked sooner. Performed at [facility], [address] [place], [place] [address]   Culture, blood (Routine X 2) w Reflex to ID Panel     Status: None (Preliminary result)   Collection Time: 01/09/19 10:38 PM   Specimen: BLOOD  Result Value Ref Range Status   Specimen Description BLOOD BLOOD LEFT HAND  Final   Special Requests   Final    BOTTLES DRAWN AEROBIC AND ANAEROBIC Blood Culture adequate volume   Culture   Final    NO GROWTH 4 DAYS Performed at [facility], [address] [place], [place] [address]    Report Status PENDING  Incomplete  Culture, blood (Routine X 2) w Reflex to ID Panel     Status: None (Preliminary result)   Collection Time: 01/09/19 10:39 PM   Specimen: BLOOD  Result Value Ref Range Status   Specimen Description BLOOD BLOOD LEFT WRIST  Final   Special Requests   Final    BOTTLES DRAWN AEROBIC AND ANAEROBIC Blood Culture adequate volume   Culture   Final    NO GROWTH 4 DAYS Performed at [facility], [address], [place], [place] [address]    Report Status PENDING  Incomplete  MRSA PCR Screening     Status: None   Collection Time: 01/09/19 11:21 PM   Specimen: Nasal Mucosa; Nasopharyngeal  Result Value Ref Range Status   MRSA by PCR NEGATIVE NEGATIVE Final    Comment:        The GeneXpert MRSA Assay (FDA approved for NASAL specimens only), is one component of a comprehensive MRSA colonization surveillance program. It is not intended to diagnose MRSA infection nor to guide or monitor treatment for MRSA infections. Performed at [facility], [address] [place], [place] [address]   Culture, respiratory (non-expectorated)     Status: None    Collection Time: 01/10/19  6:35 AM   Specimen: Tracheal Aspirate; Respiratory  Result Value Ref Range Status   Specimen Description   Final    TRACHEAL ASPIRATE Performed at [facility], [address] [place], [place] [address]    Special Requests   Final    Normal Performed at [facility], [address] [place], [place] [address]    Gram Stain   Final    MODERATE WBC PRESENT, PREDOMINANTLY PMN NO ORGANISMS SEEN    Culture   Final    RARE Consistent with normal respiratory flora. Performed at [facility], [place] [address], [place], [place] [address]    Report Status 01/13/2019 FINAL  Final  Urine Culture     Status: None   Collection Time: 01/12/19 10:58 AM   Specimen: Urine, Random  Result Value Ref Range Status   Specimen Description   Final    URINE, RANDOM Performed at [facility], [address], [place], [place] [address]    Special Requests   Final    NONE Performed at [facility], [address] [place], [place] [address]    Culture   Final    NO GROWTH Performed at [facility], [place] [address], [place], [place] [address]    Report Status 01/13/2019 FINAL  Final    Coagulation Studies: Recent Labs    01/13/19 1824  LABPROT 14.9  INR 1.2    Imaging: No results found.  Medications:  I have reviewed the patient's current medications. Scheduled: . atorvastatin  80 mg Per Tube q1800  . chlorhexidine gluconate (MEDLINE KIT)  15 mL Mouth Rinse BID  . Chlorhexidine Gluconate Cloth  6 each Topical Daily  . feeding supplement (PRO-STAT SUGAR FREE 64)  30 mL Per Tube BID  . mouth rinse  15 mL Mouth Rinse 10 times per day  . pantoprazole sodium  40 mg Per Tube Daily  . 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 Intubated. Remains unresponsive.  Prognosis remains poor for functional  recovery.    LOS: 5 days  Alexis Goodell, MD Neurology [phone] 01/14/2019  8:51 AM

## 2019-01-14 DEATH — deceased

## 2019-01-15 ENCOUNTER — Inpatient Hospital Stay: Payer: Medicaid Other

## 2019-01-15 DIAGNOSIS — Z515 Encounter for palliative care: Secondary | ICD-10-CM

## 2019-01-15 DIAGNOSIS — J9602 Acute respiratory failure with hypercapnia: Secondary | ICD-10-CM

## 2019-01-15 DIAGNOSIS — J9601 Acute respiratory failure with hypoxia: Secondary | ICD-10-CM

## 2019-01-15 LAB — GLUCOSE, CAPILLARY
Glucose-Capillary: 131 mg/dL — ABNORMAL HIGH (ref 70–99)
Glucose-Capillary: 112 mg/dL — ABNORMAL HIGH (ref 70–99)
Glucose-Capillary: 90 mg/dL (ref 70–99)
Glucose-Capillary: 126 mg/dL — ABNORMAL HIGH (ref 70–99)
Glucose-Capillary: 107 mg/dL — ABNORMAL HIGH (ref 70–99)

## 2019-01-15 LAB — BASIC METABOLIC PANEL
Anion gap: 8 (ref 5–15)
BUN: 26 mg/dL — ABNORMAL HIGH (ref 6–20)
CO2: 28 mmol/L (ref 22–32)
Calcium: 8.6 mg/dL — ABNORMAL LOW (ref 8.9–10.3)
Chloride: 111 mmol/L (ref 98–111)
Creatinine, Ser: 0.75 mg/dL (ref 0.61–1.24)
GFR calc Af Amer: >60 mL/min (ref >60)
GFR calc non Af Amer: >60 mL/min (ref >60)
Glucose, Bld: 143 mg/dL — ABNORMAL HIGH (ref 70–99)
Potassium: 3.8 mmol/L (ref 3.5–5.1)
Sodium: 147 mmol/L — ABNORMAL HIGH (ref 135–145)

## 2019-01-15 LAB — MAGNESIUM: Magnesium: 2.2 mg/dL (ref 1.7–2.4)

## 2019-01-15 MED ORDER — GLYCOPYRROLATE 0.2 MG/ML IJ SOLN
0.1000 mg | Freq: Once | INTRAMUSCULAR | Status: AC
  Administered 2019-01-15: 11:00:00 0.1 mg via INTRAVENOUS
  Filled 2019-01-15: qty 1

## 2019-01-15 MED ORDER — GLYCOPYRROLATE 0.2 MG/ML IJ SOLN
0.1000 mg | INTRAMUSCULAR | Status: DC
  Administered 2019-01-15 – 2019-01-16 (×5): 0.1 mg via INTRAVENOUS
  Filled 2019-01-15 (×5): qty 1

## 2019-01-15 NOTE — Anesthesia Preprocedure Evaluation (Deleted)
Anesthesia Evaluation  Patient identified by MRN, date of birth, ID band Patient unresponsive    Reviewed: Allergy & Precautions, H&P , NPO status , Patient's Chart, lab work & pertinent test results  History of Anesthesia Complications Negative for: history of anesthetic complications  Airway Mallampati: Intubated  TM Distance: >3 FB     Dental  (+) Chipped   Pulmonary Current Smoker,           Cardiovascular hypertension,      Neuro/Psych CVA, Residual Symptoms negative psych ROS   GI/Hepatic negative GI ROS, Neg liver ROS,   Endo/Other  negative endocrine ROS  Renal/GU      Musculoskeletal   Abdominal   Peds  Hematology negative hematology ROS (+)   Anesthesia Other Findings Past Medical History: No date: Hypertension  History reviewed. No pertinent surgical history.  BMI    Body Mass Index: 23.53 kg/m      Reproductive/Obstetrics negative OB ROS                             Anesthesia Physical Anesthesia Plan  ASA: IV  Anesthesia Plan: General ETT   Post-op Pain Management:    Induction: Intravenous  PONV Risk Score and Plan: Ondansetron, Dexamethasone, Midazolam and Treatment may vary due to age or medical condition  Airway Management Planned: Oral ETT  Additional Equipment:   Intra-op Plan:   Post-operative Plan: Post-operative intubation/ventilation  Informed Consent: I have reviewed the patients History and Physical, chart, labs and discussed the procedure including the risks, benefits and alternatives for the proposed anesthesia with the patient or authorized representative who has indicated his/her understanding and acceptance.     Dental Advisory Given  Plan Discussed with: Anesthesiologist, CRNA and Surgeon  Anesthesia Plan Comments: (History and consent by phone from sister, Jace Freier at [phone]   She was consented for risks of anesthesia  including but not limited to:  - adverse reactions to medications - damage to teeth, lips or other oral mucosa - sore throat or hoarseness - Damage to heart, brain, lungs or loss of life  She voiced understanding.)        Anesthesia Quick Evaluation

## 2019-01-15 NOTE — Progress Notes (Signed)
11/2:  Patient with previous admission with a left sided weakness in whom MRI on 10/19 Small acute infarcts in the right pons and right cerebellum.Additional small subacute to chronic right cerebellar infarcts.2.8 cm right parietal parafalcine extra-axial mass consistent with a meningioma. Mild vasogenic edema and local mass effect and d/c on DAP. Patient  re admitted on 10/26  With worsening of left sided weakness and unabilitiy to swallow. MRI on 10/26 shows multiple new acute posterior circulation infarcts. 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 Patient admitted to ICU/intubated on 10/27 due to unresponsiveness  Per nurse at bedside, patient off sedation since 10/30. Nurse states that he ties to open his eye to his name. My understanding per nurse and provider he is scheduled for tentaive Trach/Peg, but family remains undecisive.  Objective: Current vital signs: BP 126/87   Pulse 79   Temp 98.9 F (37.2 C) (Oral)   Resp 18   Ht 5' 10"  (1.778 m)   Wt 74.4 kg   SpO2 99%   BMI 23.53 kg/m  Vital signs in last 24 hours: Temp:  [98.9 F (37.2 C)-99.8 F (37.7 C)] 98.9 F (37.2 C) (11/02 0748) Pulse Rate:  [72-92] 79 (11/02 0800) Resp:  [15-19] 18 (11/02 0800) BP: (126-187)/(84-115) 126/87 (11/02 0800) SpO2:  [97 %-100 %] 99 % (11/02 0800) FiO2 (%):  [28 %] 28 % (11/02 0750) Weight:  [74.4 kg] 74.4 kg (11/02 0455)  Intake/Output from previous day: 11/01 0701 - 11/02 0700 In: 2085.9 [NG/GT:1697; IV Piggyback:388.9] Out: 1105 [Urine:955; Stool:150] Intake/Output this shift: Total I/O In: -  Out: 75 [Urine:75] Nutritional status:  Diet Order            Diet NPO time specified  Diet effective now              Neurologic Exam on 11/2 Mental  Status: Patient is intubated,  on no sedation since 10/30 ( per nurse at bedside) Patient tries to open his eyes to his name. Does not follow commands Does not respond to deep sternal rub. Speech unable to assess due to intubation. Cranial Nerves: Forward gaze, pupils is 3 mm on the right reactive, 2 mm on the left reactive, no nystagmus noted,  No face grimacing to painful stimuli, corneal present b/l, no dolls appreciated, has very weak to absent gag and cough. No mvts in UEX/LEX to painful stimuli DTRs, Coordination and sensory exam not conducted at time of evaluation.   Lab Results: Basic Metabolic Panel: Recent Labs  Lab 01/11/19 0328 01/12/19 0327 01/13/19 0526 01/14/19 0414 01/15/19 0440  NA 143 144 147* 149* 147*  K 3.9 3.8 3.9 3.6 3.8  CL 110 109 112* 111 111  CO2 26 27 30 31 28   GLUCOSE 110* 153* 133* 132* 143*  BUN 28* 31* 29* 28* 26*  CREATININE 0.99 0.99 1.03 0.81 0.75  CALCIUM 8.5* 8.5* 8.4* 8.5* 8.6*  MG  --  2.3  --   --  2.2  PHOS  --  2.7  --  3.6  --     Liver Function Tests: Recent Labs  Lab 12/18/2018 1353 01/09/19 2239 01/14/19 0414  AST 16 16  --   ALT 21 17  --   ALKPHOS 60 58  --   BILITOT 1.2 1.4*  --   PROT 7.0 6.3*  --  ALBUMIN 3.8 3.3* 2.8*   No results for input(s): LIPASE, AMYLASE in the last 168 hours. No results for input(s): AMMONIA in the last 168 hours.  CBC: Recent Labs  Lab 12/29/2018 1353 01/09/19 2239 01/11/19 0328 01/13/19 0526 01/14/19 0414  WBC 7.3 7.0 7.2 7.2 8.8  NEUTROABS 4.6  --   --   --   --   HGB 17.4* 16.8 15.5 14.3 14.1  HCT 53.7* 53.0* 49.5 45.3 45.6  MCV 87.7 89.5 89.8 90.1 92.9  PLT 353 320 288 308 310    Cardiac Enzymes: No results for input(s): CKTOTAL, CKMB, CKMBINDEX, TROPONINI in the last 168 hours.  Lipid Panel: Recent Labs  Lab 01/10/19 0131 01/10/19 0814 01/13/19 0801  CHOL 116  --   --   TRIG 71 74 61  HDL 28*  --   --   CHOLHDL 4.1  --   --   VLDL 14  --   --   LDLCALC 74  --    --     CBG: Recent Labs  Lab 01/14/19 1611 01/14/19 1930 01/14/19 2334 01/15/19 0451 01/15/19 0732  GLUCAP 97 110* 119* 131* 112*    Microbiology: Results for orders placed or performed during the hospital encounter of 01/12/2019  SARS Coronavirus 2 by RT PCR (hospital order, performed in [facility] hospital lab) Nasopharyngeal Nasopharyngeal Swab     Status: None   Collection Time: 01/01/2019  1:51 PM   Specimen: Nasopharyngeal Swab  Result Value Ref Range Status   SARS Coronavirus 2 NEGATIVE NEGATIVE Final    Comment: (NOTE) If result is NEGATIVE SARS-CoV-2 target nucleic acids are NOT DETECTED. The SARS-CoV-2 RNA is generally detectable in upper and lower  respiratory specimens during the acute phase of infection. The lowest  concentration of SARS-CoV-2 viral copies this assay can detect is 250  copies / mL. A negative result does not preclude SARS-CoV-2 infection  and should not be used as the sole basis for treatment or other  patient management decisions.  A negative result may occur with  improper specimen collection / handling, submission of specimen other  than nasopharyngeal swab, presence of viral mutation(s) within the  areas targeted by this assay, and inadequate number of viral copies  (<250 copies / mL). A negative result must be combined with clinical  observations, patient history, and epidemiological information. If result is POSITIVE SARS-CoV-2 target nucleic acids are DETECTED. The SARS-CoV-2 RNA is generally detectable in upper and lower  respiratory specimens dur ing the acute phase of infection.  Positive  results are indicative of active infection with SARS-CoV-2.  Clinical  correlation with patient history and other diagnostic information is  necessary to determine patient infection status.  Positive results do  not rule out bacterial infection or co-infection with other viruses. If result is PRESUMPTIVE POSTIVE SARS-CoV-2 nucleic acids MAY BE  PRESENT.   A presumptive positive result was obtained on the submitted specimen  and confirmed on repeat testing.  While 2019 novel coronavirus  (SARS-CoV-2) nucleic acids may be present in the submitted sample  additional confirmatory testing may be necessary for epidemiological  and / or clinical management purposes  to differentiate between  SARS-CoV-2 and other Sarbecovirus currently known to infect humans.  If clinically indicated additional testing with an alternate test  methodology [phone]) is advised. The SARS-CoV-2 RNA is generally  detectable in upper and lower respiratory sp ecimens during the acute  phase of infection. The expected result is Negative. Fact Sheet for  Patients:  StrictlyIdeas.no Fact Sheet for Healthcare Providers: BankingDealers.co.za This test is not yet approved or cleared by the [place] FDA and has been authorized for detection and/or diagnosis of SARS-CoV-2 by FDA under an Emergency Use Authorization (EUA).  This EUA will remain in effect (meaning this test can be used) for the duration of the COVID-19 declaration under Section 564(b)(1) of the Act, 21 U.S.C. section 360bbb-3(b)(1), unless the authorization is terminated or revoked sooner. Performed at [facility], [address] [place], [place] [address]   Culture, blood (Routine X 2) w Reflex to ID Panel     Status: None   Collection Time: 01/09/19 10:38 PM   Specimen: BLOOD  Result Value Ref Range Status   Specimen Description BLOOD BLOOD LEFT HAND  Final   Special Requests   Final    BOTTLES DRAWN AEROBIC AND ANAEROBIC Blood Culture adequate volume   Culture   Final    NO GROWTH 5 DAYS Performed at [facility], [address] [place], [place] [address]    Report Status 01/14/2019 FINAL  Final  Culture, blood (Routine X 2) w Reflex to ID Panel     Status: None   Collection Time: 01/09/19 10:39 PM   Specimen: BLOOD   Result Value Ref Range Status   Specimen Description BLOOD BLOOD LEFT WRIST  Final   Special Requests   Final    BOTTLES DRAWN AEROBIC AND ANAEROBIC Blood Culture adequate volume   Culture   Final    NO GROWTH 5 DAYS Performed at [facility], [address] [place], [place] [address]    Report Status 01/14/2019 FINAL  Final  MRSA PCR Screening     Status: None   Collection Time: 01/09/19 11:21 PM   Specimen: Nasal Mucosa; Nasopharyngeal  Result Value Ref Range Status   MRSA by PCR NEGATIVE NEGATIVE Final    Comment:        The GeneXpert MRSA Assay (FDA approved for NASAL specimens only), is one component of a comprehensive MRSA colonization surveillance program. It is not intended to diagnose MRSA infection nor to guide or monitor treatment for MRSA infections. Performed at [facility], [address] [place], [place] [address]   Culture, respiratory (non-expectorated)     Status: None   Collection Time: 01/10/19  6:35 AM   Specimen: Tracheal Aspirate; Respiratory  Result Value Ref Range Status   Specimen Description   Final    TRACHEAL ASPIRATE Performed at [facility], [address] [place], [place] [address]    Special Requests   Final    Normal Performed at [facility], [address] [place], [place] [address]    Gram Stain   Final    MODERATE WBC PRESENT, PREDOMINANTLY PMN NO ORGANISMS SEEN    Culture   Final    RARE Consistent with normal respiratory flora. Performed at [facility], [place] [address] [place], [place] [address]    Report Status 01/13/2019 FINAL  Final  Urine Culture     Status: None   Collection Time: 01/12/19 10:58 AM   Specimen: Urine, Random  Result Value Ref Range Status   Specimen Description   Final    URINE, RANDOM Performed at [facility], [address] [place], [place] [address]    Special Requests   Final    NONE Performed at [facility], [address] [place],  [address]    Culture  Final    NO GROWTH Performed at [facility], [place] [address] [place], [place] [address]    Report Status 01/13/2019 FINAL  Final    Coagulation Studies: Recent Labs    01/13/19 1824  LABPROT 14.9  INR 1.2    Imaging: No results found.  Medications:  I have reviewed the patient's current medications. Scheduled: . atorvastatin  80 mg Per Tube q1800  . chlorhexidine gluconate (MEDLINE KIT)  15 mL Mouth Rinse BID  . Chlorhexidine Gluconate Cloth  6 each Topical Daily  . feeding supplement (PRO-STAT SUGAR FREE 64)  30 mL Per Tube BID  . mouth rinse  15 mL Mouth Rinse 10 times per day  . pantoprazole sodium  40 mg Per Tube Daily  . 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 His neuro exam althought with very slight and minimal  improvement remains poor.  Recs: - Continue ICU with freq neuro checks - Neuro protectives measures including normothermia, normoglycemia, correct electrolytes/metabolic abnilites, treat any infection while admitted - Primary team raised concern for brain death: Patient does not meet criteria of brain death for the time being in that he has only a stroke in BS with no other devastating injury to whole brain. Most likely he will have a poor functional recovery. - Poor functional recovery: He most likely need a trach/PEG and placement  if family decides to pursue aggressive treatment. Per primary team family in [place]. Would suggest family meeting for goal of care. I would more than happy to help with family discussion.   - Will continue to follow up clinical evolution and assist with family meeting  - Discussed the case with primary team.  Creig Hines, MD Neurology/Neurocritical care  01/15/2019  9:16 AM

## 2019-01-15 NOTE — Progress Notes (Addendum)
VM's left for both sister and daughter to discuss plan of care/goals of care. PMT contact information given and requested return call.   NO CHARGE  Ihor Dow, [place], FNP-C Palliative Medicine Team  Phone: [phone] Fax: [phone]

## 2019-01-15 NOTE — Progress Notes (Signed)
Pt has had no significant changes throughout shift. Patients family updated on pts status by md. Pt is currently stable on vent. Plan for terminal extubation tomorrow.

## 2019-01-15 NOTE — Progress Notes (Signed)
Assisted tele visit to patient with family member.  Miron Marxen Ann, RN  

## 2019-01-15 NOTE — Progress Notes (Signed)
Pharmacy Electrolyte Monitoring Consult:  Pharmacy consulted to assist in monitoring and replacing electrolytes in this 58 y.o. male admitted on 12/28/2018. Patient currently in the ICU with acute hypoxic and hypercapnic respiratory failure from aspiration with progressive encephalopathy. 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   11/2: Patient is on ventilator without sedation.   Labs:  Sodium (mmol/L)  Date Value  01/15/2019 147 (H)  10/10/2013 143   Potassium (mmol/L)  Date Value  01/15/2019 3.8  10/10/2013 3.8   Magnesium (mg/dL)  Date Value  01/15/2019 2.2   Phosphorus (mg/dL)  Date Value  01/14/2019 3.6   Calcium (mg/dL)  Date Value  01/15/2019 8.6 (L)   Calcium, Total (mg/dL)  Date Value  10/10/2013 8.6   Albumin (g/dL)  Date Value  01/14/2019 2.8 (L)   Corrected Calcium: 9.6 mg/dL  Assessment/Plan: 1. Electrolytes: no replacement warranted. Patient receiving Vital AF 1.5 at 5mL/hr, along with free water flushes 350mL q4h. Will obtain follow-up electrolytes with AM labs.   2. Glucose: Patient is having scheduled glucose checks. Will continue to monitor and add SSI as appropriate. BG 90-143.  3. Constipation: LBM 11/2. Will continue to monitor on Miralax daily prn and Senokot-S bedtime prn.   Pharmacy will continue to monitor and adjust per consult.   Raiford Simmonds, PharmD Candidate 01/15/2019

## 2019-01-15 NOTE — Progress Notes (Addendum)
CRITICAL CARE NOTE  CC  follow up CVA and respiratory failure   SUBJECTIVE Patient remains critically ill Prognosis is guarded Pt remains on a ventilator without sedation He has a trach scheduled for tomorrow, but family has not made a decision regarding next steps yet    BP (!) 138/91   Pulse 86   Temp 98.9 F (37.2 C) (Oral)   Resp 17   Ht 5\' 10"  (1.778 m)   Wt 74.4 kg   SpO2 98%   BMI 23.53 kg/m    I/O last 3 completed shifts: In: 2085.9 [NG/GT:1697; IV Piggyback:388.9] Out: 1870 [Urine:1420; Stool:450] Total I/O In: 100 [NG/GT:100] Out: 175 [Urine:175]  SpO2: 98 % O2 Flow Rate (L/min): 2 L/min FiO2 (%): 28 %   SIGNIFICANT EVENTS 10/26: Presented to ED with AMS after recent CVA. Found to have ST depression on EKG and new infarcts found on CT. Started on vanc and unasyn for possible aspiration pneumonia. Admitted to telemetry unit 10/27: Transferred to ICU for unresponsiveness 10/28: Intubated 10/29: Pt remains intubated. Plavix held for possible procedures.  10/30: Pt remains on a ventilator at an FiO2 of 28% without sedation. Vancomycin has been stopped and unasyn continued. Pt has been tolerating OG tube feeds. Urine tox was negative yesterday. Head CT showed no changes. Palliative consulted, family making decision today regarding next step. GI and ENT have been consulted about possible PEG and trach placements.  10/31: No change in neuro status, obtunded.  Some hematuria, mucosal membrane oozing.  Anticoagulants/antiplatelets discontinued. 11/01: Remains comatose 11/02: Pt remains on ventilator at FiO2 of 28% without sedation. Pt is slightly more responsive today than before, opening his eyes minimally to verbal stimuli. Unasyn has been discontinued due to negative cultures. Hematuria and bloody secretions resolved. Scheduled for a trach tomorrow. Family still undecided about next steps.   Family conference today: Donnald Garre, Daughter Sharlee Blew and Cherlynn Perches on  phone conference. They will to move forward with comfort weaning. They wish to come in tommorow at 11am to initiate comfort measures.   REVIEW OF SYSTEMS  PATIENT IS UNABLE TO PROVIDE COMPLETE REVIEW OF SYSTEMS DUE TO SEVERE CRITICAL ILLNESS   PHYSICAL EXAMINATION:  GENERAL:critically ill appearing, +resp distress, opens eyes slightly to verbal stimuli  HEAD: Normocephalic, atraumatic.  EYES: Pupils equal, round, reactive to light.  No scleral icterus.  MOUTH: Endotracheal tube and OG tube in place.  NECK: Supple.  PULMONARY: +course rhonchi CARDIOVASCULAR: S1 and S2. Regular rate and rhythm. No murmurs, rubs, or gallops.  GASTROINTESTINAL: Soft,  -distended. No masses. Hypoactive bowel sounds.  MUSCULOSKELETAL: No swelling, clubbing, or edema.  NEUROLOGIC: obtunded, GCS<8 SKIN:intact,warm,dry GU: Urine is amber color in the Foley bag. No hematuria noted.   MEDICATIONS: I have reviewed all medications and confirmed regimen as documented   CULTURE RESULTS   Recent Results (from the past 240 hour(s))  SARS Coronavirus 2 by RT PCR (hospital order, performed in [facility] hospital lab) Nasopharyngeal Nasopharyngeal Swab     Status: None   Collection Time: 12/25/2018  1:51 PM   Specimen: Nasopharyngeal Swab  Result Value Ref Range Status   SARS Coronavirus 2 NEGATIVE NEGATIVE Final    Comment: (NOTE) If result is NEGATIVE SARS-CoV-2 target nucleic acids are NOT DETECTED. The SARS-CoV-2 RNA is generally detectable in upper and lower  respiratory specimens during the acute phase of infection. The lowest  concentration of SARS-CoV-2 viral copies this assay can detect is 250  copies / mL. A negative result does not  preclude SARS-CoV-2 infection  and should not be used as the sole basis for treatment or other  patient management decisions.  A negative result may occur with  improper specimen collection / handling, submission of specimen other  than nasopharyngeal swab, presence of  viral mutation(s) within the  areas targeted by this assay, and inadequate number of viral copies  (<250 copies / mL). A negative result must be combined with clinical  observations, patient history, and epidemiological information. If result is POSITIVE SARS-CoV-2 target nucleic acids are DETECTED. The SARS-CoV-2 RNA is generally detectable in upper and lower  respiratory specimens dur ing the acute phase of infection.  Positive  results are indicative of active infection with SARS-CoV-2.  Clinical  correlation with patient history and other diagnostic information is  necessary to determine patient infection status.  Positive results do  not rule out bacterial infection or co-infection with other viruses. If result is PRESUMPTIVE POSTIVE SARS-CoV-2 nucleic acids MAY BE PRESENT.   A presumptive positive result was obtained on the submitted specimen  and confirmed on repeat testing.  While 2019 novel coronavirus  (SARS-CoV-2) nucleic acids may be present in the submitted sample  additional confirmatory testing may be necessary for epidemiological  and / or clinical management purposes  to differentiate between  SARS-CoV-2 and other Sarbecovirus currently known to infect humans.  If clinically indicated additional testing with an alternate test  methodology [phone]) is advised. The SARS-CoV-2 RNA is generally  detectable in upper and lower respiratory sp ecimens during the acute  phase of infection. The expected result is Negative. Fact Sheet for Patients:  StrictlyIdeas.no Fact Sheet for Healthcare Providers: BankingDealers.co.za This test is not yet approved or cleared by the [place] FDA and has been authorized for detection and/or diagnosis of SARS-CoV-2 by FDA under an Emergency Use Authorization (EUA).  This EUA will remain in effect (meaning this test can be used) for the duration of the COVID-19 declaration under Section  564(b)(1) of the Act, 21 U.S.C. section 360bbb-3(b)(1), unless the authorization is terminated or revoked sooner. Performed at [facility], [address] [place], [place] [address]   Culture, blood (Routine X 2) w Reflex to ID Panel     Status: None   Collection Time: 01/09/19 10:38 PM   Specimen: BLOOD  Result Value Ref Range Status   Specimen Description BLOOD BLOOD LEFT HAND  Final   Special Requests   Final    BOTTLES DRAWN AEROBIC AND ANAEROBIC Blood Culture adequate volume   Culture   Final    NO GROWTH 5 DAYS Performed at [facility], [address], [place], [place] [address]    Report Status 01/14/2019 FINAL  Final  Culture, blood (Routine X 2) w Reflex to ID Panel     Status: None   Collection Time: 01/09/19 10:39 PM   Specimen: BLOOD  Result Value Ref Range Status   Specimen Description BLOOD BLOOD LEFT WRIST  Final   Special Requests   Final    BOTTLES DRAWN AEROBIC AND ANAEROBIC Blood Culture adequate volume   Culture   Final    NO GROWTH 5 DAYS Performed at [facility], [address], [place],  [address]    Report Status 01/14/2019 FINAL  Final  MRSA PCR Screening     Status: None   Collection Time: 01/09/19 11:21 PM   Specimen: Nasal Mucosa; Nasopharyngeal  Result Value Ref Range Status   MRSA by PCR NEGATIVE NEGATIVE Final  Comment:        The GeneXpert MRSA Assay (FDA approved for NASAL specimens only), is one component of a comprehensive MRSA colonization surveillance program. It is not intended to diagnose MRSA infection nor to guide or monitor treatment for MRSA infections. Performed at [facility], [address] [place], [place] [address]   Culture, respiratory (non-expectorated)     Status: None   Collection Time: 01/10/19  6:35 AM   Specimen: Tracheal Aspirate; Respiratory  Result Value Ref Range Status   Specimen Description   Final    TRACHEAL ASPIRATE Performed at [facility], [address] [place], [place] [address]    Special Requests   Final    Normal Performed at [facility], [address] [place], [place] [address]    Gram Stain   Final    MODERATE WBC PRESENT, PREDOMINANTLY PMN NO ORGANISMS SEEN    Culture   Final    RARE Consistent with normal respiratory flora. Performed at [facility], [place] [address], [place], [place] [address]    Report Status 01/13/2019 FINAL  Final  Urine Culture     Status: None   Collection Time: 01/12/19 10:58 AM   Specimen: Urine, Random  Result Value Ref Range Status   Specimen Description   Final    URINE, RANDOM Performed at [facility], [address], [place], [place] [address]    Special Requests   Final    NONE Performed at [facility], [address], [place], [place] [address]    Culture   Final    NO GROWTH Performed at [facility], [place] [address], [place],  [address]    Report Status 01/13/2019 FINAL  Final          IMAGING    No results found.     Indwelling Urinary Catheter continued, requirement due to   Reason to continue Indwelling Urinary Catheter strict Intake/Output monitoring for hemodynamic instability   Central Line/ continued, requirement due to  Reason to continue Bernice of central venous pressure or other hemodynamic parameters and poor IV access   Ventilator continued, requirement due to severe respiratory failure   Ventilator Sedation No sedation       ASSESSMENT AND PLAN SYNOPSIS   Severe ACUTE Hypoxic and Hypercapnic Respiratory Failure -continue Full MV support -continue Bronchodilator Therapy -Wean Fio2 and PEEP as tolerated -will not be able to perform SBTs due to inability of pt to maintain airway -Pt scheduled for trach tomorrow, but may not be done depending on family's decision for next steps    NEUROLOGY - intubated - s/p posterior circulation CVA -s/p neuro eval - poor  prognosis -had family conference today - plan for comfort weaning tommorow -no sedation    CARDIAC -ICU monitoring -continue to hold anticoagulants  -continue SCDs -allow permissive HTN   HEME -hematuria and bloody secretions resolved  -continue to monitor Hbg and Hct   ID -unasyn discontinued  -continue IV abx as prescibed -follow up cultures  GI GI PROPHYLAXIS as indicated  NUTRITIONAL STATUS DIET-->TF's as tolerated Constipation protocol as indicated  ENDO - will use ICU hypoglycemic\Hyperglycemia protocol if indicated   ELECTROLYTES -follow labs as needed -replace as needed -pharmacy consultation and following   DVT/GI PRX ordered TRANSFUSIONS AS NEEDED MONITOR FSBS ASSESS the need for LABS as needed   Critical Care Time devoted to patient care services described in this note is 32 minutes.  Overall, patient is critically ill, prognosis is poor.  Patient with Multiorgan failure and at high risk for cardiac arrest and death. Palliative care has been consulted. Next steps depend on family decision.     Ottie Glazier, M.D.  Pulmonary [facility] [place]

## 2019-01-15 NOTE — Progress Notes (Signed)
Shift summary:  - Plan: family to bedside at 1100 hrs tomorrow for terminal extubation  - This RN spoke with Dr. Ileene Hutchinson RN, Delilah Shan (?), at [facility], to cancel scheduled tracheostomy that had been planned for tomorrow morning.

## 2019-01-15 NOTE — Progress Notes (Signed)
Daily Progress Note   Patient Name: Paul Leonard       Date: 01/15/2019 DOB: 10-Aug-1960  Age: 58 y.o. MRN#: [id number] Attending Physician: Ottie Glazier, MD Primary Care Physician: Patient, No Pcp Per Admit Date: 12/16/2018  Reason for Consultation/Follow-up: Establishing goals of care  Subjective: Patient remains intubated. Sedation off since 10/30. Remains poorly responsive, will intermittently/non-purposefully open eyes but does not respond to painful stimuli or follow commands.   Discussed with PCCM and RN.  No family at bedside. VM's left for sister and daughter.   **This afternoon, again discussed with RN. Dr. Lanney Gins has reached family and discussed goals of care. Family decision against trach/peg placement and plan to withdrawal care to comfort measures tomorrow, 11/3 at 11am.   Length of Stay: 6  Current Medications: Scheduled Meds:  . atorvastatin  80 mg Per Tube q1800  . chlorhexidine gluconate (MEDLINE KIT)  15 mL Mouth Rinse BID  . Chlorhexidine Gluconate Cloth  6 each Topical Daily  . feeding supplement (PRO-STAT SUGAR FREE 64)  30 mL Per Tube BID  . mouth rinse  15 mL Mouth Rinse 10 times per day  . pantoprazole sodium  40 mg Per Tube Daily  . scopolamine  1 patch Transdermal Q72H    Continuous Infusions: . sodium chloride Stopped (01/13/19 0635)  . feeding supplement (VITAL 1.5 CAL) 1,000 mL (01/15/19 1352)    PRN Meds: sodium chloride, acetaminophen **OR** acetaminophen (TYLENOL) oral liquid 160 mg/5 mL **OR** acetaminophen, fentaNYL (SUBLIMAZE) injection, ipratropium-albuterol, labetalol, midazolam, polyethylene glycol, senna-docusate  Physical Exam Vitals signs and nursing note reviewed.  Constitutional:      Appearance: He is ill-appearing.   Interventions: He is intubated.     Comments: Poorly responsive  Cardiovascular:     Rate and Rhythm: Normal rate.  Pulmonary:     Effort: No tachypnea, accessory muscle usage or respiratory distress. He is intubated.     Breath sounds: Normal breath sounds.     Comments: Full vent support Neurological:     Comments: Nonpurposefully opens eyes. No responsive to painful stimuli. Does not follow commands.             Vital Signs: BP (!) 142/95   Pulse 82   Temp 100.3 F (37.9 C) (Oral)   Resp 16   Ht _0  (  1.778 m)   Wt 74.4 kg   SpO2 96%   BMI 23.53 kg/m  SpO2: SpO2: 96 % O2 Device: O2 Device: Ventilator O2 Flow Rate: O2 Flow Rate (L/min): 2 L/min  Intake/output summary:   Intake/Output Summary (Last 24 hours) at 01/15/2019 1405 Last data filed at 01/15/2019 1200 Gross per 24 hour  Intake 1400 ml  Output 1060 ml  Net 340 ml   LBM: Last BM Date: 01/15/19 Baseline Weight: Weight: 83.9 kg Most recent weight: Weight: 74.4 kg       Palliative Assessment/Data: PPS 10%    Flowsheet Rows     Most Recent Value  Intake Tab  Referral Department  Critical care  Unit at Time of Referral  ICU  Palliative Care Primary Diagnosis  Neurology  Date Notified  01/10/19  Palliative Care Type  New Palliative care  Reason for referral  Clarify Goals of Care  Date of Admission  12/28/2018  Date first seen by Palliative Care  01/10/19  # of days Palliative referral response time  0 Day(s)  # of days IP prior to Palliative referral  2  Clinical Assessment  Psychosocial & Spiritual Assessment  Palliative Care Outcomes      Patient Active Problem List   Diagnosis Date Noted  . Abnormal EKG   . Acute CVA (cerebrovascular accident) ([place]) 01/01/2019    Palliative Care Assessment & Plan   Patient Profile: [name] The patient presented to the ED with worsening altered mental status. The patient has  increased left-sided weakness and the difficulty swallowing. MRI of the brain report multiple new infarcts. Patient remains intubated with minimal responsiveness. Patient off sedation since 10/30 with no improvement in neuro status.   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 No family at bedside. VM's left for patient's daughter and sister.   F/u with RN this afternoon. Dr. Lanney Gins has reached family & further discussed plan of care. Family decision against trach/peg placement and to move forward with compassionate extubation to comfort. Family planning to arrive at [facility] 11/3 at 11am.  Will need further discussion tomorrow regarding code status, compassionate extubation, and transition to comfort measures.   PMT will follow.   Code Status: FULL   Code Status Orders  (From admission, onward)         Start     Ordered   01/09/19 2127  Full code  Continuous     01/09/19 2126        Code Status History    Date Active Date Inactive Code Status Order ID Comments User Context   01/01/2019 2113 01/04/2019 2153 Full Code [id number]  Demetrios Loll, MD Inpatient   Advance Care Planning Activity       Prognosis:  Poor prognosis  Discharge Planning:  To Be Determined: Anticipate hospital death once compassionately extubated to comfort.   Care plan was discussed with Dr. Lanney Gins, RN, VM's left for patient's sister/daughter  Thank you for allowing the Palliative Medicine Team to assist in the care of this patient.   Time In: 1140 Time Out: 1200 Total Time 20 Prolonged Time Billed  no      Greater than 50%  of this time was spent counseling and coordinating care related to the above assessment and plan.  Ihor Dow, DNP,  FNP-C Palliative Medicine Team  Phone: [phone] Fax: [phone]  Please contact Palliative Medicine Team phone at [phone] for questions and concerns.

## 2019-01-16 ENCOUNTER — Encounter: Admission: EM | Disposition: E | Payer: Self-pay | Source: Home / Self Care | Attending: Internal Medicine

## 2019-01-16 LAB — GLUCOSE, CAPILLARY
Glucose-Capillary: 100 mg/dL — ABNORMAL HIGH (ref 70–99)
Glucose-Capillary: 115 mg/dL — ABNORMAL HIGH (ref 70–99)
Glucose-Capillary: 146 mg/dL — ABNORMAL HIGH (ref 70–99)

## 2019-01-16 LAB — TRIGLYCERIDES: Triglycerides: 36 mg/dL (ref <150)

## 2019-01-16 SURGERY — CREATION, TRACHEOSTOMY
Anesthesia: General

## 2019-01-16 MED ORDER — MORPHINE 100MG IN NS 100ML (1MG/ML) PREMIX INFUSION
1.0000 mg/h | INTRAVENOUS | Status: DC
  Administered 2019-01-16: 11:00:00 2 mg/h via INTRAVENOUS
  Administered 2019-01-17: 20:00:00 5 mg/h via INTRAVENOUS
  Filled 2019-01-16: qty 100

## 2019-01-16 MED ORDER — BIOTENE DRY MOUTH MT LIQD: 15.0000 mL | OROMUCOSAL | Status: DC | PRN

## 2019-01-16 MED ORDER — LORAZEPAM 2 MG/ML IJ SOLN
1.0000 mg | INTRAMUSCULAR | Status: DC | PRN
  Administered 2019-01-17 (×2): 1 mg via INTRAVENOUS
  Filled 2019-01-16 (×2): qty 1

## 2019-01-16 MED ORDER — HALOPERIDOL LACTATE 2 MG/ML PO CONC
0.5000 mg | ORAL | Status: DC | PRN
  Filled 2019-01-16: qty 0.3

## 2019-01-16 MED ORDER — HALOPERIDOL LACTATE 5 MG/ML IJ SOLN: 0.5000 mg | INTRAMUSCULAR | Status: DC | PRN

## 2019-01-16 MED ORDER — HALOPERIDOL 0.5 MG PO TABS
0.5000 mg | ORAL_TABLET | ORAL | Status: DC | PRN
  Filled 2019-01-16: qty 1

## 2019-01-16 MED ORDER — ONDANSETRON 4 MG PO TBDP
4.0000 mg | ORAL_TABLET | Freq: Four times a day (QID) | ORAL | Status: DC | PRN
  Filled 2019-01-16: qty 1

## 2019-01-16 MED ORDER — ONDANSETRON HCL 4 MG/2ML IJ SOLN: 4.0000 mg | Freq: Four times a day (QID) | INTRAMUSCULAR | Status: DC | PRN

## 2019-01-16 MED ORDER — POLYVINYL ALCOHOL 1.4 % OP SOLN
1.0000 [drp] | Freq: Four times a day (QID) | OPHTHALMIC | Status: DC | PRN
  Filled 2019-01-16: qty 15

## 2019-01-16 MED ORDER — GLYCOPYRROLATE 0.2 MG/ML IJ SOLN
0.3000 mg | INTRAMUSCULAR | Status: DC | PRN
  Administered 2019-01-16 – 2019-01-18 (×6): 0.3 mg via INTRAVENOUS
  Filled 2019-01-16 (×6): qty 2

## 2019-01-16 MED ORDER — MORPHINE BOLUS VIA INFUSION
1.0000 mg | INTRAVENOUS | Status: DC | PRN
  Administered 2019-01-17 (×5): 2 mg via INTRAVENOUS
  Filled 2019-01-16: qty 2

## 2019-01-16 NOTE — Progress Notes (Signed)
CDS called- ref [id number].

## 2019-01-16 NOTE — Progress Notes (Signed)
11/03 Called by ICU attending to help with family discussion and goal of care. Family at bedside including daughter and her hysband, sister and friends. Present at the meeting Dr. Nyra Capes and nurse I examined the patient: Patient follows commands intermittently, has  Right ptosis,  PERLA, no grimacing to painful stimuli, do n ot move his extremtities to painnfull stimuli I showed the family MRI brain imaging and explained the clinical findings and how they correlate with his neurological exam. Very extensive ischemic evnt in post circulation 2/2 near occlusive proximal basilar artery. Explained to family given the extent of BS lesions and lack/rather slight improvement clinically during his ICU stay patient would be more likely to be vent dependant and will need a skilled facility nursing. He will need a PEG/Trach and 24 hrs help with his ADLS. Explained also, the probable complications pertaining to not him moving including but not limited to reccurent infections, clotting and so forth. Family voiced complete understanding, I answered all the questions to the best of my knoweldege  11/2:  Patient with previous admission with a left sided weakness in whom MRI on 10/19 Small acute infarcts in the right pons and right cerebellum.Additional small subacute to chronic right cerebellar infarcts.2.8 cm right parietal parafalcine extra-axial mass consistent with a meningioma. Mild vasogenic edema and local mass effect and d/c on DAP. Patient  re admitted on 10/26  With worsening of left sided weakness and unabilitiy to swallow. MRI on 10/26 shows multiple new acute posterior circulation infarcts. 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  Patient admitted to ICU/intubated on 10/27 due to  unresponsiveness  Per nurse at bedside, patient off sedation since 10/30. Nurse states that he ties to open his eye to his name. My understanding per nurse and provider he is scheduled for tentaive Trach/Peg, but family remains undecisive.  Objective: Current vital signs: BP 115/84   Pulse 83   Temp 99.8 F (37.7 C)   Resp 16   Ht _0  (1.778 m)   Wt 74.4 kg   SpO2 97%   BMI 23.53 kg/m  Vital signs in last 24 hours: Temp:  [99.7 F (37.6 C)-100.4 F (38 C)] 99.8 F (37.7 C) (11/03 0700) Pulse Rate:  [73-91] 83 (11/03 0700) Resp:  [10-19] 16 (11/03 0700) BP: (110-173)/(78-110) 115/84 (11/03 0700) SpO2:  [96 %-100 %] 97 % (11/03 0734) FiO2 (%):  [28 %-100 %] 28 % (11/03 0734)  Intake/Output from previous day: 11/02 0701 - 11/03 0700 In: 750 [NG/GT:750] Out: 925 [Urine:875; Stool:50] Intake/Output this shift: No intake/output data recorded. Nutritional status:  Diet Order            Diet NPO time specified  Diet effective midnight              Neurologic Exam on 11/2 Mental Status: Patient is intubated,  on no sedation since 10/30 ( per nurse at bedside) Patient tries to open his eyes to his name. Does not follow commands Does not respond to deep sternal rub. Speech unable to assess due to intubation. Cranial Nerves: Forward gaze, pupils is 3 mm on the right reactive, 2 mm on the left reactive, no nystagmus noted,  No face grimacing to painful stimuli, corneal present b/l, no dolls appreciated, has very weak to absent gag and cough. No mvts in UEX/LEX  to painful stimuli DTRs, Coordination and sensory exam not conducted at time of evaluation.   Lab Results: Basic Metabolic Panel: Recent Labs  Lab 01/11/19 0328 01/12/19 0327 01/13/19 0526 01/14/19 0414 01/15/19 0440  NA 143 144 147* 149* 147*  K 3.9 3.8 3.9 3.6 3.8  CL 110 109 112* 111 111  CO2 _0 GLUCOSE 110* 153* 133* 132* 143*  BUN 28* 31* 29* 28* 26*  CREATININE 0.99 0.99 1.03 0.81  0.75  CALCIUM 8.5* 8.5* 8.4* 8.5* 8.6*  MG  --  2.3  --   --  2.2  PHOS  --  2.7  --  3.6  --     Liver Function Tests: Recent Labs  Lab 01/09/19 2239 01/14/19 0414  AST 16  --   ALT 17  --   ALKPHOS 58  --   BILITOT 1.4*  --   PROT 6.3*  --   ALBUMIN 3.3* 2.8*   No results for input(s): LIPASE, AMYLASE in the last 168 hours. No results for input(s): AMMONIA in the last 168 hours.  CBC: Recent Labs  Lab 01/09/19 2239 01/11/19 0328 01/13/19 0526 01/14/19 0414  WBC 7.0 7.2 7.2 8.8  HGB 16.8 15.5 14.3 14.1  HCT 53.0* 49.5 45.3 45.6  MCV 89.5 89.8 90.1 92.9  PLT 320 288 308 310    Cardiac Enzymes: No results for input(s): CKTOTAL, CKMB, CKMBINDEX, TROPONINI in the last 168 hours.  Lipid Panel: Recent Labs  Lab 01/10/19 0131 01/10/19 0814 01/13/19 0801 01/29/2019 0716  CHOL 116  --   --   --   TRIG 71 74 61 36  HDL 28*  --   --   --   CHOLHDL 4.1  --   --   --   VLDL 14  --   --   --   LDLCALC 74  --   --   --     CBG: Recent Labs  Lab 01/15/19 1601 01/15/19 1945 01/20/2019 0006 02/07/2019 0350 02/10/2019 0740  GLUCAP 126* 107* 100* 146* 115*    Microbiology: Results for orders placed or performed during the hospital encounter of 12/27/2018  SARS Coronavirus 2 by RT PCR (hospital order, performed in [facility] hospital lab) Nasopharyngeal Nasopharyngeal Swab     Status: None   Collection Time: 12/23/2018  1:51 PM   Specimen: Nasopharyngeal Swab  Result Value Ref Range Status   SARS Coronavirus 2 NEGATIVE NEGATIVE Final    Comment: (NOTE) If result is NEGATIVE SARS-CoV-2 target nucleic acids are NOT DETECTED. The SARS-CoV-2 RNA is generally detectable in upper and lower  respiratory specimens during the acute phase of infection. The lowest  concentration of SARS-CoV-2 viral copies this assay can detect is 250  copies / mL. A negative result does not preclude SARS-CoV-2 infection  and should not be used as the sole basis for treatment or other  patient  management decisions.  A negative result may occur with  improper specimen collection / handling, submission of specimen other  than nasopharyngeal swab, presence of viral mutation(s) within the  areas targeted by this assay, and inadequate number of viral copies  (<250 copies / mL). A negative result must be combined with clinical  observations, patient history, and epidemiological information. If result is POSITIVE SARS-CoV-2 target nucleic acids are DETECTED. The SARS-CoV-2 RNA is generally detectable in upper and lower  respiratory specimens dur ing the acute phase of infection.  Positive  results are indicative of  active infection with SARS-CoV-2.  Clinical  correlation with patient history and other diagnostic information is  necessary to determine patient infection status.  Positive results do  not rule out bacterial infection or co-infection with other viruses. If result is PRESUMPTIVE POSTIVE SARS-CoV-2 nucleic acids MAY BE PRESENT.   A presumptive positive result was obtained on the submitted specimen  and confirmed on repeat testing.  While 2019 novel coronavirus  (SARS-CoV-2) nucleic acids may be present in the submitted sample  additional confirmatory testing may be necessary for epidemiological  and / or clinical management purposes  to differentiate between  SARS-CoV-2 and other Sarbecovirus currently known to infect humans.  If clinically indicated additional testing with an alternate test  methodology [id number]) is advised. The SARS-CoV-2 RNA is generally  detectable in upper and lower respiratory sp ecimens during the acute  phase of infection. The expected result is Negative. Fact Sheet for Patients:  StrictlyIdeas.no Fact Sheet for Healthcare Providers: BankingDealers.co.za This test is not yet approved or cleared by the [place] FDA and has been authorized for detection and/or diagnosis of SARS-CoV-2 by FDA under  an Emergency Use Authorization (EUA).  This EUA will remain in effect (meaning this test can be used) for the duration of the COVID-19 declaration under Section 564(b)(1) of the Act, 21 U.S.C. section 360bbb-3(b)(1), unless the authorization is terminated or revoked sooner. Performed at [facility], [address] [place], [place] [address]   Culture, blood (Routine X 2) w Reflex to ID Panel     Status: None   Collection Time: 01/09/19 10:38 PM   Specimen: BLOOD  Result Value Ref Range Status   Specimen Description BLOOD BLOOD LEFT HAND  Final   Special Requests   Final    BOTTLES DRAWN AEROBIC AND ANAEROBIC Blood Culture adequate volume   Culture   Final    NO GROWTH 5 DAYS Performed at [facility], [address] [place], [place] [address]    Report Status 01/14/2019 FINAL  Final  Culture, blood (Routine X 2) w Reflex to ID Panel     Status: None   Collection Time: 01/09/19 10:39 PM   Specimen: BLOOD  Result Value Ref Range Status   Specimen Description BLOOD BLOOD LEFT WRIST  Final   Special Requests   Final    BOTTLES DRAWN AEROBIC AND ANAEROBIC Blood Culture adequate volume   Culture   Final    NO GROWTH 5 DAYS Performed at [facility], [address] [place], [place] [address]    Report Status 01/14/2019 FINAL  Final  MRSA PCR Screening     Status: None   Collection Time: 01/09/19 11:21 PM   Specimen: Nasal Mucosa; Nasopharyngeal  Result Value Ref Range Status   MRSA by PCR NEGATIVE NEGATIVE Final    Comment:        The GeneXpert MRSA Assay (FDA approved for NASAL specimens only), is one component of a comprehensive MRSA colonization surveillance program. It is not intended to diagnose MRSA infection nor to guide or monitor treatment for MRSA infections. Performed at [facility], [address] [place], [place] [address]   Culture, respiratory (non-expectorated)     Status: None   Collection Time: 01/10/19  6:35  AM   Specimen: Tracheal Aspirate; Respiratory  Result Value Ref Range Status   Specimen Description   Final    TRACHEAL ASPIRATE Performed at [facility], [address] [place],  [address]    Special  Requests   Final    Normal Performed at [facility], [place], [place] [address]    Gram Stain   Final    MODERATE WBC PRESENT, PREDOMINANTLY PMN NO ORGANISMS SEEN    Culture   Final    RARE Consistent with normal respiratory flora. Performed at [facility], [place] [address], [place], [place] [address]    Report Status 01/13/2019 FINAL  Final  Urine Culture     Status: None   Collection Time: 01/12/19 10:58 AM   Specimen: Urine, Random  Result Value Ref Range Status   Specimen Description   Final    URINE, RANDOM Performed at [facility], [address] [place], [place] [address]    Special Requests   Final    NONE Performed at [facility], [address], [place], [place] [address]    Culture   Final    NO GROWTH Performed at [facility], [place] [address], [place], [place] [address]    Report Status 01/13/2019 FINAL  Final    Coagulation Studies: Recent Labs    01/13/19 1824  LABPROT 14.9  INR 1.2    Imaging: No results found.  Medications:  I have reviewed the patient's current medications. Scheduled: . chlorhexidine gluconate (MEDLINE KIT)  15 mL Mouth Rinse BID  . Chlorhexidine Gluconate Cloth  6 each Topical Daily  . glycopyrrolate  0.1 mg Intravenous Q4H  . mouth rinse  15 mL Mouth Rinse 10 times per day  . scopolamine  1 patch Transdermal Q72H    Assessment/Plan: 58 year old male with h/o HTN in whom MRI on 10/26 shows multiple new acute posterior circulation infarctsAbnormal appearance of the basilar artery and distal rightvertebral artery with a near occlusive stenosis of the proximal basilar artery on recent CTA.Unchanged 3 cm right parietal parafalcine meningioma with  mildedema.Mild chronic small vessel ischemic disease. His neuro exam althought with very slight and minimal  improvement remains poor.  Recs: - Continue ICU with freq neuro checks - Neuro protectives measures including normothermia, normoglycemia, correct electrolytes/metabolic abnilites, treat any infection while admitted - Primary team raised concern for brain death: Patient does not meet criteria of brain death for the time being in that he has only a stroke in BS with no other devastating injury to whole brain. Most likely he will have a poor functional recovery. - Poor functional recovery: He most likely need a trach/PEG and placement  if family decides to pursue aggressive treatment. Per primary team family in [place]. Would suggest family meeting for goal of care. I would more than happy to help with family discussion.  - Will continue to follow up clinical evolution and assist with family meeting  - Discussed the case with primary team.  Creig Hines, MD Neurology/Neurocritical care  01/27/2019  11:44 AM

## 2019-01-16 NOTE — Progress Notes (Signed)
Pt extubated to CMO

## 2019-01-16 NOTE — Progress Notes (Signed)
Pt was present for spiritual and social support while the pt was being transitioned to C-C status.  F/U support recommended.    01/20/2019 1150  Clinical Encounter Type  Visited With Patient and family together;Health care provider  Visit Type Spiritual support;Social support;Critical Care  Referral From Nurse  Consult/Referral To Chaplain  Spiritual Encounters  Spiritual Needs Emotional;Grief support  Stress Factors  Patient Stress Factors Exhausted;Health changes;Loss of control  Family Stress Factors Major life changes

## 2019-01-16 NOTE — Progress Notes (Addendum)
Daily Progress Note   Patient Name: Paul Leonard       Date: 02/11/2019 DOB: [birth year]  Age: 58 y.o. MRN#: [id number] Attending Physician: Ottie Glazier, MD Primary Care Physician: Patient, No Pcp Per Admit Date: 01/12/2019  Reason for Consultation/Follow-up: Establishing goals of care  Subjective: Patient remains intubated. 5 family members present at bedside for planned one way extubation. They are tearful. They are spending time with Paul Leonard before extubation.   Orders in place for comfort care. Likely hours to days. Patient is not stable at this time to be transferred to hospice.   I completed a MOST form today and the original signed by daughter Paul Leonard was placed in the chart. A photocopy was placed in the chart to be scanned into EMR. The patient's family outlined their wishes for the following treatment decisions:  Cardiopulmonary Resuscitation: Do Not Attempt Resuscitation (DNR/No CPR)  Medical Interventions: Comfort Measures: Keep clean, warm, and dry. Use medication by any route, positioning, wound care, and other measures to relieve pain and suffering. Use oxygen, suction and manual treatment of airway obstruction as needed for comfort. Do not transfer to the hospital unless comfort needs cannot be met in current location.  Antibiotics: No antibiotics (use other measures to relieve symptoms)  IV Fluids: No IV fluids (provide other measures to ensure comfort)  Feeding Tube: No feeding tube    ADDENDUM: Patient is extubated with family at bedside. Appears comfortable at this time.     Length of Stay: 7  Current Medications: Scheduled Meds:  . chlorhexidine gluconate (MEDLINE KIT)  15 mL Mouth Rinse BID  . Chlorhexidine Gluconate Cloth  6 each Topical Daily  . mouth  rinse  15 mL Mouth Rinse 10 times per day  . scopolamine  1 patch Transdermal Q72H    Continuous Infusions: . sodium chloride Stopped (01/13/19 0635)  . morphine 2 mg/hr (01/22/2019 1043)    PRN Meds: sodium chloride, acetaminophen **OR** acetaminophen (TYLENOL) oral liquid 160 mg/5 mL **OR** acetaminophen, glycopyrrolate, LORazepam, morphine, senna-docusate  Physical Exam Vitals signs and nursing note reviewed.  Constitutional:      Appearance: He is ill-appearing.     Interventions: He is intubated.     Comments: Poorly responsive  Cardiovascular:     Rate and Rhythm: Normal rate.  Pulmonary:  Effort: No tachypnea, accessory muscle usage or respiratory distress. He is intubated.     Breath sounds: Normal breath sounds.     Comments: Full vent support Neurological:     Comments: Nonpurposefully opens eyes. No responsive to painful stimuli. Does not follow commands.             Vital Signs: BP 115/84   Pulse 83   Temp 99.8 F (37.7 C)   Resp 16   Ht 5' 10"  (1.778 m)   Wt 74.4 kg   SpO2 97%   BMI 23.53 kg/m  SpO2: SpO2: 97 % O2 Device: O2 Device: Ventilator O2 Flow Rate: O2 Flow Rate (L/min): 2 L/min  Intake/output summary:   Intake/Output Summary (Last 24 hours) at 01/14/2019 1158 Last data filed at 01/28/2019 0400 Gross per 24 hour  Intake 650 ml  Output 675 ml  Net -25 ml   LBM: Last BM Date: 01/15/19 Baseline Weight: Weight: 83.9 kg Most recent weight: Weight: 74.4 kg       Palliative Assessment/Data: PPS 10%    Flowsheet Rows     Most Recent Value  Intake Tab  Referral Department  Critical care  Unit at Time of Referral  ICU  Palliative Care Primary Diagnosis  Neurology  Date Notified  01/10/19  Palliative Care Type  New Palliative care  Reason for referral  Clarify Goals of Care  Date of Admission  12/20/2018  Date first seen by Palliative Care  01/10/19  # of days Palliative referral response time  0 Day(s)  # of days IP prior to Palliative  referral  2  Clinical Assessment  Psychosocial & Spiritual Assessment  Palliative Care Outcomes      Patient Active Problem List   Diagnosis Date Noted  . Palliative care by specialist   . Acute respiratory failure with hypoxia and hypercapnia ([place])   . Abnormal EKG   . Acute CVA (cerebrovascular accident) ([place]) 01/01/2019    Palliative Care Assessment & Plan   Patient Profile: Paul Leonard a58 y.o.malewith a known history of recent CVA and hypertension. The patient presented to the ED with worsening altered mental status. The patient has increased left-sided weakness and the difficulty swallowing. MRI of the brain report multiple new infarcts. Patient remains intubated with minimal responsiveness. Patient off sedation since 10/30 with no improvement in neuro status.   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   Patient now extubated and appears comfortable.   Code Status: FULL   Code Status Orders  (From admission, onward)         Start     Ordered   01/09/19 2127  Full code  Continuous     01/09/19 2126        Code Status History    Date Active Date Inactive Code Status Order ID Comments User Context   01/01/2019 2113 01/04/2019 2153 Full Code [id number]  Demetrios Loll, MD Inpatient   Advance Care Planning Activity       Prognosis:  Hours to days. Not stable to transfer out of hospital at this time.   Discharge Planning:  To Be Determined: Anticipate hospital death once compassionately extubated to comfort.   Care plan was discussed with RN  Thank you for allowing the Palliative Medicine Team to assist in the care of this patient.  Total Time 35 Prolonged Time Billed  no      Greater than 50%   of this time was spent counseling and coordinating care related to the above assessment and plan.   Please contact Palliative Medicine Team phone at [phone] for questions and concerns.

## 2019-01-16 NOTE — TOC Progression Note (Signed)
Assisted tele visit to patient with family member.  Rilla Buckman R, RN  

## 2019-01-16 NOTE — Progress Notes (Signed)
CRITICAL CARE NOTE  CC  follow up CVA and respiratory failure   SUBJECTIVE Patient remains critically ill Prognosis is guarded Pt remains on a ventilator without sedation He had trache scheduled but family did not consent , we had goals of care discussion yesterday with all family and they wish to move forward with compassionate weaning with plan to transfer to hospice.   BP 115/84   Pulse 83   Temp 99.8 F (37.7 C)   Resp 16   Ht 5\' 10"  (1.778 m)   Wt 74.4 kg   SpO2 97%   BMI 23.53 kg/m    I/O last 3 completed shifts: In: 1250 [NG/GT:1250] Out: [id number] [Urine:1425; Stool:100] No intake/output data recorded.  SpO2: 97 % O2 Flow Rate (L/min): 2 L/min FiO2 (%): 28 %   SIGNIFICANT EVENTS 10/26: Presented to ED with AMS after recent CVA. Found to have ST depression on EKG and new infarcts found on CT. Started on vanc and unasyn for possible aspiration pneumonia. Admitted to telemetry unit 10/27: Transferred to ICU for unresponsiveness 10/28: Intubated 10/29: Pt remains intubated. Plavix held for possible procedures.  10/30: Pt remains on a ventilator at an FiO2 of 28% without sedation. Vancomycin has been stopped and unasyn continued. Pt has been tolerating OG tube feeds. Urine tox was negative yesterday. Head CT showed no changes. Palliative consulted, family making decision today regarding next step. GI and ENT have been consulted about possible PEG and trach placements.  10/31: No change in neuro status, obtunded.  Some hematuria, mucosal membrane oozing.  Anticoagulants/antiplatelets discontinued. 11/01: Remains comatose 11/02: Pt remains on ventilator at FiO2 of 28% without sedation. Pt is slightly more responsive today than before, opening his eyes minimally to verbal stimuli. Unasyn has been discontinued due to negative cultures. Hematuria and bloody secretions resolved. Scheduled for a trach tomorrow. Family still undecided about next steps. 11/3 - plan for liberation from  ventilator with comfort measures.    Family conference today: Donnald Garre, Daughter Sharlee Blew and Cherlynn Perches on phone conference. They will to move forward with comfort weaning. They wish to come in tommorow at 11am to initiate comfort measures.   REVIEW OF SYSTEMS  PATIENT IS UNABLE TO PROVIDE COMPLETE REVIEW OF SYSTEMS DUE TO SEVERE CRITICAL ILLNESS   PHYSICAL EXAMINATION:  GENERAL:critically ill appearing, +resp distress, opens eyes slightly to verbal stimuli  HEAD: Normocephalic, atraumatic.  EYES: Pupils equal, round, reactive to light.  No scleral icterus.  MOUTH: Endotracheal tube and OG tube in place.  NECK: Supple.  PULMONARY: +course rhonchi CARDIOVASCULAR: S1 and S2. Regular rate and rhythm. No murmurs, rubs, or gallops.  GASTROINTESTINAL: Soft,  -distended. No masses. Hypoactive bowel sounds.  MUSCULOSKELETAL: No swelling, clubbing, or edema.  NEUROLOGIC: obtunded, GCS<8 SKIN:intact,warm,dry GU: Urine is amber color in the Foley bag. No hematuria noted.   MEDICATIONS: I have reviewed all medications and confirmed regimen as documented   CULTURE RESULTS   Recent Results (from the past 240 hour(s))  SARS Coronavirus 2 by RT PCR (hospital order, performed in [facility] hospital lab) Nasopharyngeal Nasopharyngeal Swab     Status: None   Collection Time: 01/13/2019  1:51 PM   Specimen: Nasopharyngeal Swab  Result Value Ref Range Status   SARS Coronavirus 2 NEGATIVE NEGATIVE Final    Comment: (NOTE) If result is NEGATIVE SARS-CoV-2 target nucleic acids are NOT DETECTED. The SARS-CoV-2 RNA is generally detectable in upper and lower  respiratory specimens during the acute phase of infection. The lowest  concentration  of SARS-CoV-2 viral copies this assay can detect is 250  copies / mL. A negative result does not preclude SARS-CoV-2 infection  and should not be used as the sole basis for treatment or other  patient management decisions.  A negative result may occur with   improper specimen collection / handling, submission of specimen other  than nasopharyngeal swab, presence of viral mutation(s) within the  areas targeted by this assay, and inadequate number of viral copies  (<250 copies / mL). A negative result must be combined with clinical  observations, patient history, and epidemiological information. If result is POSITIVE SARS-CoV-2 target nucleic acids are DETECTED. The SARS-CoV-2 RNA is generally detectable in upper and lower  respiratory specimens dur ing the acute phase of infection.  Positive  results are indicative of active infection with SARS-CoV-2.  Clinical  correlation with patient history and other diagnostic information is  necessary to determine patient infection status.  Positive results do  not rule out bacterial infection or co-infection with other viruses. If result is PRESUMPTIVE POSTIVE SARS-CoV-2 nucleic acids MAY BE PRESENT.   A presumptive positive result was obtained on the submitted specimen  and confirmed on repeat testing.  While 2019 novel coronavirus  (SARS-CoV-2) nucleic acids may be present in the submitted sample  additional confirmatory testing may be necessary for epidemiological  and / or clinical management purposes  to differentiate between  SARS-CoV-2 and other Sarbecovirus currently known to infect humans.  If clinically indicated additional testing with an alternate test  methodology [phone]) is advised. The SARS-CoV-2 RNA is generally  detectable in upper and lower respiratory sp ecimens during the acute  phase of infection. The expected result is Negative. Fact Sheet for Patients:  StrictlyIdeas.no Fact Sheet for Healthcare Providers: BankingDealers.co.za This test is not yet approved or cleared by the [place] FDA and has been authorized for detection and/or diagnosis of SARS-CoV-2 by FDA under an Emergency Use Authorization (EUA).  This EUA will  remain in effect (meaning this test can be used) for the duration of the COVID-19 declaration under Section 564(b)(1) of the Act, 21 U.S.C. section 360bbb-3(b)(1), unless the authorization is terminated or revoked sooner. Performed at [facility], [address] [place], [place] [address]   Culture, blood (Routine X 2) w Reflex to ID Panel     Status: None   Collection Time: 01/09/19 10:38 PM   Specimen: BLOOD  Result Value Ref Range Status   Specimen Description BLOOD BLOOD LEFT HAND  Final   Special Requests   Final    BOTTLES DRAWN AEROBIC AND ANAEROBIC Blood Culture adequate volume   Culture   Final    NO GROWTH 5 DAYS Performed at [facility], [address], [place], [place] [address]    Report Status 01/14/2019 FINAL  Final  Culture, blood (Routine X 2) w Reflex to ID Panel     Status: None   Collection Time: 01/09/19 10:39 PM   Specimen: BLOOD  Result Value Ref Range Status   Specimen Description BLOOD BLOOD LEFT WRIST  Final   Special Requests   Final    BOTTLES DRAWN AEROBIC AND ANAEROBIC Blood Culture adequate volume   Culture   Final    NO GROWTH 5 DAYS Performed at [facility], [address] [place], [place] [address]    Report Status 01/14/2019 FINAL  Final  MRSA PCR Screening     Status: None   Collection Time: 01/09/19 11:21 PM   Specimen:  Nasal Mucosa; Nasopharyngeal  Result Value Ref Range Status   MRSA by PCR NEGATIVE NEGATIVE Final    Comment:        The GeneXpert MRSA Assay (FDA approved for NASAL specimens only), is one component of a comprehensive MRSA colonization surveillance program. It is not intended to diagnose MRSA infection nor to guide or monitor treatment for MRSA infections. Performed at [facility], [address] [place], [place] [address]   Culture, respiratory (non-expectorated)     Status: None   Collection Time: 01/10/19  6:35 AM   Specimen: Tracheal Aspirate; Respiratory   Result Value Ref Range Status   Specimen Description   Final    TRACHEAL ASPIRATE Performed at [facility], [address] [place], [place] [address]    Special Requests   Final    Normal Performed at [facility], [address] [place], [place] [address]    Gram Stain   Final    MODERATE WBC PRESENT, PREDOMINANTLY PMN NO ORGANISMS SEEN    Culture   Final    RARE Consistent with normal respiratory flora. Performed at [facility], [address], [place], [place] [address]    Report Status 01/13/2019 FINAL  Final  Urine Culture     Status: None   Collection Time: 01/12/19 10:58 AM   Specimen: Urine, Random  Result Value Ref Range Status   Specimen Description   Final    URINE, RANDOM Performed at [facility], [address] [place], [place] [address]    Special Requests   Final    NONE Performed at [facility], [address], [place], [place] [address]    Culture   Final    NO GROWTH Performed at [facility], [place] [address] [place], [place] [address]    Report Status 01/13/2019 FINAL  Final          IMAGING    No results found.     Indwelling Urinary Catheter continued, requirement due to   Reason to continue Indwelling Urinary Catheter strict Intake/Output monitoring for hemodynamic instability   Central Line/ continued, requirement due to  Reason to continue [place] of central venous pressure or other hemodynamic parameters and poor IV access   Ventilator continued, requirement due to severe respiratory failure   Ventilator Sedation No sedation       ASSESSMENT AND PLAN SYNOPSIS   Severe ACUTE Hypoxic and Hypercapnic Respiratory Failure -continue Full MV support -continue Bronchodilator Therapy -Wean Fio2 and PEEP as tolerated -will not be able to perform SBTs due to inability of pt to maintain airway -Pt scheduled for trach tomorrow, but may not be done depending on  family's decision for next steps    NEUROLOGY - intubated - s/p posterior circulation CVA -s/p neuro eval - poor prognosis -had family conference today - plan for comfort weaning tommorow -no sedation    CARDIAC -ICU monitoring -continue to hold anticoagulants  -continue SCDs -allow permissive HTN   HEME -hematuria and bloody secretions resolved  -continue to monitor Hbg and Hct   ID -unasyn discontinued  -continue IV abx as prescibed -follow up cultures  GI GI PROPHYLAXIS as indicated  NUTRITIONAL STATUS DIET-->TF's as tolerated Constipation protocol as indicated  ENDO - will use ICU hypoglycemic\Hyperglycemia protocol if indicated   ELECTROLYTES -follow labs as needed -replace as needed -pharmacy consultation and following   DVT/GI PRX ordered TRANSFUSIONS AS NEEDED MONITOR FSBS ASSESS the need for  LABS as needed   Critical Care Time devoted to patient care services described in this note is 32 minutes.   Overall, patient is critically ill, prognosis is poor.  Patient with Multiorgan failure and at high risk for cardiac arrest and death. Palliative care has been consulted. Next steps depend on family decision.     Ottie Glazier, M.D.  [facility] [place]

## 2019-01-16 NOTE — Progress Notes (Signed)
Patient made comfort care. On morphine, appears to be comfortable. Sister at bedside. NP visited with patient and family throughout the day. Patient being moved to floor room 104.

## 2019-01-16 NOTE — Progress Notes (Signed)
Nutrition Brief Follow-Up Note  Chart reviewed and discussed on rounds this AM. Plan is for transition to comfort care today and extubation.  No further nutrition interventions warranted at this time. Please re-consult RD as needed.   Jacklynn Barnacle, MS, RD, LDN Office: [phone] Pager: [phone] After Hours/Weekend Pager: [phone]

## 2019-01-16 NOTE — Progress Notes (Signed)
Comfort care orders initiated. Patient extubated.

## 2019-01-17 MED ORDER — ACETAMINOPHEN 10 MG/ML IV SOLN
1000.0000 mg | Freq: Three times a day (TID) | INTRAVENOUS | Status: DC | PRN
  Administered 2019-01-17: 1000 mg via INTRAVENOUS
  Filled 2019-01-17: qty 100

## 2019-01-17 MED ORDER — ACETAMINOPHEN 10 MG/ML IV SOLN
1000.0000 mg | Freq: Once | INTRAVENOUS | Status: AC
  Administered 2019-01-17: 1000 mg via INTRAVENOUS
  Filled 2019-01-17 (×2): qty 100

## 2019-01-17 NOTE — Progress Notes (Signed)
Daily Progress Note   Patient Name: Paul Leonard       Date: 01/17/2019 DOB: 1961-01-31  Age: 58 y.o. MRN#: [id number] Attending Physician: Ottie Glazier, MD Primary Care Physician: Patient, No Pcp Per Admit Date: 12/26/2018  Reason for Consultation/Follow-up: Establishing goals of care  Subjective: Patient extubated yesterday. He is comfort care. This morning his sister is at bedside. She and primary RN state he has declined since this morning at shift change. He appears uncomfortable. His respirations are uneven. He is tachypneic and has a congested cough. RN treated for fever earlier. Morphine PRN boluses ordered, and drip increasing as needed for comfort and air hunger. Robinul PRN and scopolamine patch for excessive secretions. Ativan PRN for anxiety.    I completed a MOST form yesterday and the original signed by daughter Paul Leonard was placed in the chart. A photocopy was placed in the chart to be scanned into EMR. The patient's family outlined their wishes for the following treatment decisions:  Cardiopulmonary Resuscitation: Do Not Attempt Resuscitation (DNR/No CPR)  Medical Interventions: Comfort Measures: Keep clean, warm, and dry. Use medication by any route, positioning, wound care, and other measures to relieve pain and suffering. Use oxygen, suction and manual treatment of airway obstruction as needed for comfort. Do not transfer to the hospital unless comfort needs cannot be met in current location.  Antibiotics: No antibiotics (use other measures to relieve symptoms)  IV Fluids: No IV fluids (provide other measures to ensure comfort)  Feeding Tube: No feeding tube        Length of Stay: 8  Current Medications: Scheduled Meds:  . chlorhexidine gluconate (MEDLINE KIT)  15  mL Mouth Rinse BID  . mouth rinse  15 mL Mouth Rinse 10 times per day  . scopolamine  1 patch Transdermal Q72H    Continuous Infusions: . morphine 3 mg/hr (01/17/19 1109)    PRN Meds: acetaminophen **OR** acetaminophen (TYLENOL) oral liquid 160 mg/5 mL **OR** acetaminophen, antiseptic oral rinse, glycopyrrolate, haloperidol **OR** haloperidol **OR** haloperidol lactate, LORazepam, morphine, ondansetron **OR** ondansetron (ZOFRAN) IV, polyvinyl alcohol, senna-docusate  Physical Exam Vitals signs and nursing note reviewed.  Constitutional:      Appearance: He is ill-appearing.     Interventions: He is intubated.     Comments: Poorly responsive  Cardiovascular:     Rate  and Rhythm: Normal rate.  Pulmonary:     Effort: No tachypnea, accessory muscle usage or respiratory distress. He is intubated.     Breath sounds: Normal breath sounds.     Comments: Extubated Neurological:     Comments: Nonpurposefully opens eyes. No responsive to painful stimuli. Does not follow commands.             Vital Signs: BP (!) 88/76 (BP Location: Left Arm)   Pulse 80   Temp (!) 102.1 F (38.9 C) (Axillary)   Resp 12   Ht 5' 10"  (1.778 m)   Wt 74.4 kg   SpO2 92%   BMI 23.53 kg/m  SpO2: SpO2: 92 % O2 Device: O2 Device: Nasal Cannula O2 Flow Rate: O2 Flow Rate (L/min): 2 L/min  Intake/output summary:   Intake/Output Summary (Last 24 hours) at 01/17/2019 1146 Last data filed at 01/17/2019 1829 Gross per 24 hour  Intake 50.04 ml  Output 350 ml  Net -299.96 ml   LBM: Last BM Date: 01/15/19 Baseline Weight: Weight: 83.9 kg Most recent weight: Weight: 74.4 kg       Palliative Assessment/Data: PPS 10%    Flowsheet Rows     Most Recent Value  Intake Tab  Referral Department  Critical care  Unit at Time of Referral  ICU  Palliative Care Primary Diagnosis  Neurology  Date Notified  01/10/19  Palliative Care Type  New Palliative care  Reason for referral  Clarify Goals of Care  Date of  Admission  01/05/2019  Date first seen by Palliative Care  01/10/19  # of days Palliative referral response time  0 Day(s)  # of days IP prior to Palliative referral  2  Clinical Assessment  Psychosocial & Spiritual Assessment  Palliative Care Outcomes      Patient Active Problem List   Diagnosis Date Noted  . Palliative care by specialist   . Acute respiratory failure with hypoxia and hypercapnia ([place])   . Abnormal EKG   . Acute CVA (cerebrovascular accident) ([place]) 01/01/2019    Palliative Care Assessment & Plan   Patient Profile: Paul Leonard a58 y.o.malewith a known history of recent CVA and hypertension. The patient presented to the ED with worsening altered mental status. The patient has increased left-sided weakness and the difficulty swallowing. MRI of the brain report multiple new infarcts. Patient remains intubated with minimal responsiveness. Patient off sedation since 10/30 with no improvement in neuro status.   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   Code Status: FULL   Code Status Orders  (From admission, onward)         Start     Ordered   01/09/19 2127  Full code  Continuous     01/09/19 2126        Code Status History    Date Active Date Inactive Code Status Order ID Comments User Context   01/01/2019 2113 01/04/2019 2153 Full Code [id number]  Demetrios Loll, MD Inpatient   Advance Care Planning Activity       Prognosis:  Hours to days. Not stable to transfer out of hospital at this time.   Discharge Planning:  To Be Determined: Anticipate hospital death once compassionately extubated to comfort.   Care plan was discussed with RN  Thank you for allowing the Palliative Medicine Team to assist in the  care of  this patient.   Total Time 35 Prolonged Time Billed  no      Greater than 50%  of this time was spent counseling and coordinating care related to the above assessment and plan.   Please contact Palliative Medicine Team phone at [phone] for questions and concerns.

## 2019-01-17 NOTE — Progress Notes (Signed)
PROGRESS NOTE    Paul Leonard  [id number] DOB: 17-Jan-1961 DOA: 01/13/2019 PCP: Patient, No Pcp Per      Brief Narrative:  Paul Leonard is a 58 y.o. M with a PMH of HTN, meningioma and recent stroke who presented to [facility] on 10/26 with altered mental status.    Recent admit from 10/19 - 10/22 for left sided weakness and numbness.  Found to have multiple small acute infarcts in the right pons, right cerebellum with additional small subacute & chronic right cerebellar infarcts. Additional 2cm right parietal mass consistent with meningioma. He was discharged on ASA, plavix.  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 CVA felt related to embolism in the setting of severe proximal basilar artery stenosis (seen on CTA head/neck).   In the ER, the patient had worsening left sided weakness and dysphagia.  MRI brain showed several new acute basilar artery distribution strokes.  Case was discussed with Neurology and patient was admitted.       Assessment & Plan:  Basilar Artery CVA Work up notable for CT Head showed evolving low-density changes within the pons and right cerebellum. Follow up MRI demonstrated multiple new acute posterior circulation infarcts, abnormal appearance of the basilar artery and distal right vertebral artery with a near occlusive stenosis of the proximal basilar artery.    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   Here, the patient became unresponsive night of 10/27, transferred to ICU and required intubation.  He was started on empiric antibiotics but had no clinical improvement.  Family elected to transition to full comfort measures 11/3.  -full comfort measures -appreciate Palliative Care involvement  -continue morphine gtt  -scopolamine and robinul for secretions  -oral care  -anticipate imminent in hospital death        DVT prophylaxis: comfort care  Code Status: DNR Family Communication: Daughter and sister at bedside    Consultants:   PCCM   Palliative Care   Procedures:   ETT 10/28 >> 11/3  Diagnostic Tests  CT Head 10/26 >> evolving low-density changes within the pons and right cerebellum  MRI 10/26 >> multiple new acute posterior circulation infarcts, abnormal appearance of the basilar artery and distal right vertebral artery with a near occlusive stenosis of the proximal basilar artery   CTA Neck 10/26 >> unchanged severe stenosis of the mid basilar artery with short segment occlusion  Antimicrobials:   Unasyn 10/27 >> 11/1   Hospital Events 10/26 Admit with AMS in setting of CVA 10/27 Tx to ICU for unresponsiveness  10/28 Pt intubated  10/29 Plavix held. UDS negative.  10/30 Remains on vent, no sedation. Palliative Care consulted  10/31 No change in neuro exam. Hematuria, anticoagulants discontinued  11/01 No change in mental status 11/02 Remains on vent, opens eyes minimally to verbal stimuli. ABX stopped 11/03 Family conference with request for palliative extubation. Tx to floor.     Subjective: Nursing note increased respiratory effort.  Note increased secretions.  Fever.  Objective: Vitals:   01/19/2019 1200 01/22/2019 1300 01/23/2019 1758 01/17/19 0332  BP: 122/90 (!) 124/91 111/62 (!) 88/76  Pulse:  (!) 106 86 80  Resp: _0 Temp:   100 F (37.8 C) (!) 102.1 F (38.9 C)  TempSrc:    Axillary  SpO2:  91% 100% 92%  Weight:      Height:        Intake/Output  Summary (Last 24 hours) at 01/17/2019 1605 Last data filed at 01/17/2019 1638 Gross per 24 hour  Intake 46.06 ml  Output 350 ml  Net -303.94 ml   Filed Weights   01/13/19 0328 01/14/19 0331 01/15/19 0455  Weight: 76.9 kg 75 kg 74.4 kg    Examination: General appearance:  Adult male, appears well nourished, does not respond to stimuli HEENT:  Skin:   Cardiac: Tachycardic, regular, no mrumurs,  no edema Respiratory: Tachypneic, respiratory efofrt increased. Abdomen: Abdomen soft.  No grimace to palpation. MSK: No deformities or effusions. Neuro: Does not respond to stimuli. Psych: unable to assess     Data Reviewed: I have personally reviewed following labs and imaging studies:  CBC: Recent Labs  Lab 01/11/19 0328 01/13/19 0526 01/14/19 0414  WBC 7.2 7.2 8.8  HGB 15.5 14.3 14.1  HCT 49.5 45.3 45.6  MCV 89.8 90.1 92.9  PLT 288 308 453   Basic Metabolic Panel: Recent Labs  Lab 01/11/19 0328 01/12/19 0327 01/13/19 0526 01/14/19 0414 01/15/19 0440  NA 143 144 147* 149* 147*  K 3.9 3.8 3.9 3.6 3.8  CL 110 109 112* 111 111  CO2 _0 GLUCOSE 110* 153* 133* 132* 143*  BUN 28* 31* 29* 28* 26*  CREATININE 0.99 0.99 1.03 0.81 0.75  CALCIUM 8.5* 8.5* 8.4* 8.5* 8.6*  MG  --  2.3  --   --  2.2  PHOS  --  2.7  --  3.6  --    GFR: Estimated Creatinine Clearance: 102.7 mL/min (by C-G formula based on SCr of 0.75 mg/dL). Liver Function Tests: Recent Labs  Lab 01/14/19 0414  ALBUMIN 2.8*   No results for input(s): LIPASE, AMYLASE in the last 168 hours. No results for input(s): AMMONIA in the last 168 hours. Coagulation Profile: Recent Labs  Lab 01/13/19 1824  INR 1.2   Cardiac Enzymes: No results for input(s): CKTOTAL, CKMB, CKMBINDEX, TROPONINI in the last 168 hours. BNP (last 3 results) No results for input(s): PROBNP in the last 8760 hours. HbA1C: No results for input(s): HGBA1C in the last 72 hours. CBG: Recent Labs  Lab 01/15/19 1601 01/15/19 1945 01/27/2019 0006 01/22/2019 0350 01/31/2019 0740  GLUCAP 126* 107* 100* 146* 115*   Lipid Profile: Recent Labs    01/26/2019 0716  TRIG 36   Thyroid Function Tests: No results for input(s): TSH, T4TOTAL, FREET4, T3FREE, THYROIDAB in the last 72 hours. Anemia Panel: No results for input(s): VITAMINB12, FOLATE, FERRITIN, TIBC, IRON, RETICCTPCT in the last 72 hours. Urine analysis:     Component Value Date/Time   COLORURINE AMBER (A) 01/12/2019 1058   APPEARANCEUR CLOUDY (A) 01/12/2019 1058   APPEARANCEUR Clear 10/10/2013 0915   LABSPEC 1.035 (H) 01/12/2019 1058   LABSPEC 1.015 10/10/2013 0915   PHURINE 5.0 01/12/2019 1058   GLUCOSEU NEGATIVE 01/12/2019 1058   GLUCOSEU Negative 10/10/2013 0915   HGBUR LARGE (A) 01/12/2019 1058   BILIRUBINUR NEGATIVE 01/12/2019 1058   BILIRUBINUR Negative 10/10/2013 0915   KETONESUR NEGATIVE 01/12/2019 1058   PROTEINUR 100 (A) 01/12/2019 1058   NITRITE NEGATIVE 01/12/2019 1058   LEUKOCYTESUR SMALL (A) 01/12/2019 1058   LEUKOCYTESUR Negative 10/10/2013 0915   Sepsis Labs: _1 (procalcitonin:4,lacticacidven:4)  ) Recent Results (from the past 240 hour(s))  SARS Coronavirus 2 by RT PCR (hospital order, performed in [place] hospital lab) Nasopharyngeal Nasopharyngeal Swab     Status: None   Collection Time: 12/25/2018  1:51 PM   Specimen: Nasopharyngeal Swab  Result Value Ref Range Status   SARS Coronavirus 2 NEGATIVE NEGATIVE Final    Comment: (NOTE) If result is NEGATIVE SARS-CoV-2 target nucleic acids are NOT DETECTED. The SARS-CoV-2 RNA is generally detectable in upper and lower  respiratory specimens during the acute phase of infection. The lowest  concentration of SARS-CoV-2 viral copies this assay can detect is 250  copies / mL. A negative result does not preclude SARS-CoV-2 infection  and should not be used as the sole basis for treatment or other  patient management decisions.  A negative result may occur with  improper specimen collection / handling, submission of specimen other  than nasopharyngeal swab, presence of viral mutation(s) within the  areas targeted by this assay, and inadequate number of viral copies  (<250 copies / mL). A negative result must be combined with clinical  observations, patient history, and epidemiological information. If result is POSITIVE SARS-CoV-2 target nucleic acids are  DETECTED. The SARS-CoV-2 RNA is generally detectable in upper and lower  respiratory specimens dur ing the acute phase of infection.  Positive  results are indicative of active infection with SARS-CoV-2.  Clinical  correlation with patient history and other diagnostic information is  necessary to determine patient infection status.  Positive results do  not rule out bacterial infection or co-infection with other viruses. If result is PRESUMPTIVE POSTIVE SARS-CoV-2 nucleic acids MAY BE PRESENT.   A presumptive positive result was obtained on the submitted specimen  and confirmed on repeat testing.  While 2019 novel coronavirus  (SARS-CoV-2) nucleic acids may be present in the submitted sample  additional confirmatory testing may be necessary for epidemiological  and / or clinical management purposes  to differentiate between  SARS-CoV-2 and other Sarbecovirus currently known to infect humans.  If clinically indicated additional testing with an alternate test  methodology [phone]) is advised. The SARS-CoV-2 RNA is generally  detectable in upper and lower respiratory sp ecimens during the acute  phase of infection. The expected result is Negative. Fact Sheet for Patients:  StrictlyIdeas.no Fact Sheet for Healthcare Providers: BankingDealers.co.za This test is not yet approved or cleared by the [place] FDA and has been authorized for detection and/or diagnosis of SARS-CoV-2 by FDA under an Emergency Use Authorization (EUA).  This EUA will remain in effect (meaning this test can be used) for the duration of the COVID-19 declaration under Section 564(b)(1) of the Act, 21 U.S.C. section 360bbb-3(b)(1), unless the authorization is terminated or revoked sooner. Performed at [facility], [address] [place], [place] [address]   Culture, blood (Routine X 2) w Reflex to ID Panel     Status: None   Collection Time: 01/09/19  10:38 PM   Specimen: BLOOD  Result Value Ref Range Status   Specimen Description BLOOD BLOOD LEFT HAND  Final   Special Requests   Final    BOTTLES DRAWN AEROBIC AND ANAEROBIC Blood Culture adequate volume   Culture   Final    NO GROWTH 5 DAYS Performed at [facility], [address] [place], [place] [address]    Report Status 01/14/2019 FINAL  Final  Culture, blood (Routine X 2) w Reflex to ID Panel     Status: None   Collection Time: 01/09/19 10:39 PM   Specimen: BLOOD  Result Value Ref Range Status   Specimen Description BLOOD BLOOD LEFT WRIST  Final   Special Requests   Final    BOTTLES DRAWN AEROBIC AND ANAEROBIC Blood Culture adequate volume  Culture   Final    NO GROWTH 5 DAYS Performed at [facility], [address] [place], [place] [address]    Report Status 01/14/2019 FINAL  Final  MRSA PCR Screening     Status: None   Collection Time: 01/09/19 11:21 PM   Specimen: Nasal Mucosa; Nasopharyngeal  Result Value Ref Range Status   MRSA by PCR NEGATIVE NEGATIVE Final    Comment:        The GeneXpert MRSA Assay (FDA approved for NASAL specimens only), is one component of a comprehensive MRSA colonization surveillance program. It is not intended to diagnose MRSA infection nor to guide or monitor treatment for MRSA infections. Performed at [facility], [address] [place], [place] [address]   Culture, respiratory (non-expectorated)     Status: None   Collection Time: 01/10/19  6:35 AM   Specimen: Tracheal Aspirate; Respiratory  Result Value Ref Range Status   Specimen Description   Final    TRACHEAL ASPIRATE Performed at [facility], [address] [place], [place] [address]    Special Requests   Final    Normal Performed at [facility] [place] [facility], [address] [place], [place] [address]    Gram Stain   Final    MODERATE WBC PRESENT, PREDOMINANTLY PMN NO ORGANISMS SEEN    Culture   Final    RARE  Consistent with normal respiratory flora. Performed at [facility], [address] [address], [place], [place] [address]    Report Status 01/13/2019 FINAL  Final  Urine Culture     Status: None   Collection Time: 01/12/19 10:58 AM   Specimen: Urine, Random  Result Value Ref Range Status   Specimen Description   Final    URINE, RANDOM Performed at [facility], [address] [place], [place] [address]    Special Requests   Final    NONE Performed at [facility], [address], [place], [place] [address]    Culture   Final    NO GROWTH Performed at [facility], [place] [address], [place], [place] [address]    Report Status 01/13/2019 FINAL  Final         Radiology Studies: No results found.      Scheduled Meds: . chlorhexidine gluconate (MEDLINE KIT)  15 mL Mouth Rinse BID  . mouth rinse  15 mL Mouth Rinse 10 times per day  . scopolamine  1 patch Transdermal Q72H   Continuous Infusions: . morphine 4 mg/hr (01/17/19 1456)     LOS: 8 days    Time spent: 25 minutes   Noe Gens, AG-ACNP-S  [facility] 01/17/2019, 4:05 PM     Please page through AMION:  [email or web address] Password [email or web address] If 7PM-7AM, please contact night-coverage      Attending MD Note:  I have seen and examined the patient with NP-S and agree with the note above which has been edited to reflect our agreed upon history, exam, and assessment/plan.   I have personally reviewed the orders for the patient, which were made under my direction.     Coral

## 2019-01-19 ENCOUNTER — Ambulatory Visit (HOSPITAL_COMMUNITY): Payer: Self-pay

## 2019-02-13 NOTE — Death Summary Note (Signed)
Expiration Note/ Death Summary  Paul Leonard  MR#: [id number]  DOB:1960-04-09  Date of Admission: 27-Jan-2019 Date of Death: February 06, 2019  Attending Marceline  Patient's PCP: Patient, No Pcp Per  Consults: Treatment Team:  Catarina Hartshorn, MD  Cause of Death: Acute stroke    Secondary Diagnoses Present on Admission: . Hypertension    History of present Illness: Paul Leonard is a 58 y.o. M with a PMH of HTN, meningioma and recent stroke who presented to [facility] on 01/27/23 with altered mental status.    Recent admit from 10/19 - 10/22 for left sided weakness and numbness.  Found to have multiple small acute infarcts in the right pons, right cerebellum with additional small subacute & chronic right cerebellar infarcts. Additional 2cm right parietal mass consistent with meningioma. He was discharged on ASA, plavix.  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 CVA felt related to embolism in the setting of severe proximal basilar artery stenosis (seen on CTA head/neck).   In the ER, the patient had worsening left sided weakness and dysphagia.  MRI brain showed several new acute basilar artery distribution strokes.  Case was discussed with Neurology and patient was admitted.      Hospital Course: Work up notable for CT Head showed evolving low-density changes within the pons and right cerebellum. Follow up MRI demonstrated multiple new acute posterior circulation infarcts, abnormal appearance of the basilar artery and distal right vertebral artery with a near occlusive stenosis of the proximal basilar artery.    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   Here, the patient became unresponsive night of 10/27 on hospital day 2, transferred to ICU and required intubation on 10/28. Over the next 4 days, he remained intubated and with  minimal neurological improvement during weaning from sedation.   He was started on empiric antibiotics but had no clinical improvement and antibiotics were stopped after several days.  Family elected to transition to full comfort measures 11/3 and patient was terminally extubated.  Full comfort measures were provided under the direction of Palliative Care and the patient expired at 8:56AM on 02-06-19.      Diagnostic Tests  CT Head 01-27-23 >> evolving low-density changes within the pons and right cerebellum  MRI 01/27/23 >> multiple new acute posterior circulation infarcts, abnormal appearance of the basilar artery and distal right vertebral artery with a near occlusive stenosis of the proximal basilar artery   CTA Neck Jan 27, 2023 >> unchanged severe stenosis of the mid basilar artery with short segment occlusion  Antimicrobials:   Unasyn 10/27 >> 11/1        Signed:  Edwin Dada M.D. [facility] February 06, 2019, 2:33 PM Pager: [phone]

## 2019-02-13 NOTE — Progress Notes (Signed)
                                                                                                                                                                                                         Daily Progress Note   Patient Name: Paul Leonard       Date: 01/17/2019 DOB: 01/28/1961  Age: 58 y.o. MRN#: [id number] Attending Physician: Danford, Christopher P, * Primary Care Physician: Patient, No Pcp Per Admit Date: 01/05/2019  Reason for Consultation/Follow-up: End of life care.   Subjective: Arrived to bedside for symptom check and to assess for questions with family. Chaplain and multiple family members at bedside; patient has just died. Family is reflecting on his life. They discuss that he died peacefully. Support offered to family.      Length of Stay: 9  Current Medications: Scheduled Meds:  . chlorhexidine gluconate (MEDLINE KIT)  15 mL Mouth Rinse BID  . mouth rinse  15 mL Mouth Rinse 10 times per day  . scopolamine  1 patch Transdermal Q72H    Continuous Infusions: . acetaminophen Stopped (01/17/19 1749)  . morphine 5 mg/hr (02/10/2019 0100)    PRN Meds: acetaminophen, acetaminophen **OR** acetaminophen (TYLENOL) oral liquid 160 mg/5 mL **OR** acetaminophen, antiseptic oral rinse, glycopyrrolate, haloperidol **OR** haloperidol **OR** haloperidol lactate, LORazepam, morphine, ondansetron **OR** ondansetron (ZOFRAN) IV, polyvinyl alcohol, senna-docusate  Physical Exam Constitutional:      Comments: Patient is deceased.             Vital Signs: BP (!) 88/76 (BP Location: Left Arm)   Pulse 80   Temp (!) 104 F (40 C)   Resp 14   Ht 5' 10" (1.778 m)   Wt 74.4 kg   SpO2 92%   BMI 23.53 kg/m  SpO2: SpO2: 92 % O2 Device: O2 Device: Nasal Cannula O2 Flow Rate: O2 Flow Rate (L/min): 2 L/min  Intake/output summary:   Intake/Output Summary (Last 24 hours) at 01/20/2019 1046 Last data filed at 02/02/2019 0343 Gross per 24 hour  Intake 152.58 ml  Output 1050  ml  Net -897.42 ml   LBM: Last BM Date: 01/15/19 Baseline Weight: Weight: 83.9 kg Most recent weight: Weight: 74.4 kg       Palliative Assessment/Data: PPS 0%    Flowsheet Rows     Most Recent Value  Intake Tab  Referral Department  Critical care  Unit at Time of Referral  ICU  Palliative Care Primary Diagnosis  Neurology  Date Notified  01/10/19  Palliative Care Type  New Palliative care    Reason for referral  Clarify Goals of Care  Date of Admission  01/07/2019  Date first seen by Palliative Care  01/10/19  # of days Palliative referral response time  0 Day(s)  # of days IP prior to Palliative referral  2  Clinical Assessment  Psychosocial & Spiritual Assessment  Palliative Care Outcomes      Patient Active Problem List   Diagnosis Date Noted  . Palliative care by specialist   . Acute respiratory failure with hypoxia and hypercapnia ([place])   . Abnormal EKG   . Acute CVA (cerebrovascular accident) ([place]) 01/01/2019    Palliative Care Assessment & Plan   Patient Profile: [name] The patient presented to the ED with worsening altered mental status. The patient has increased left-sided weakness and the difficulty swallowing. MRI of the brain report multiple new infarcts. Patient remains intubated with minimal responsiveness. Patient off sedation since 10/30 with no improvement in neuro status.   Assessment: Patient has just died.   Recommendations/Plan:  Support offered to family. Chaplain at bedside.   Code Status: FULL   Code Status Orders  (From admission, onward)         Start     Ordered   01/09/19 2127  Full code  Continuous     01/09/19 2126        Code Status History    Date Active Date Inactive Code Status Order ID Comments User Context   01/01/2019 2113 01/04/2019 2153 Full Code [id number]  Demetrios Loll, MD Inpatient   Advance Care Planning Activity       Care plan  was discussed with RN  Thank you for allowing the Palliative Medicine Team to assist in the care of this patient.   Total Time 25 Prolonged Time Billed  no      Greater than 50%  of this time was spent counseling and coordinating care related to the above assessment and plan.   Please contact Palliative Medicine Team phone at [phone] for questions and concerns.

## 2019-02-13 NOTE — Progress Notes (Signed)
   February 15, 2019 1000  Clinical Encounter Type  Visited With Family  Visit Type Follow-up;Spiritual support;Death  Referral From Nurse  Consult/Referral To Chaplain  Spiritual Encounters  Spiritual Needs Emotional;Grief support  Stress Factors  Patient Stress Factors Loss  Family Stress Factors Loss   Chaplain was page for death of patient. Chaplain made pastoral presence know to family and gave emotional and grief support. Chaplain allow family to share their memories of patient. Chaplain prayed with family and gave words of comfort as their journey continues. Chaplain left to give family time alone with their love one.

## 2019-02-13 NOTE — Progress Notes (Signed)
Patient expired at 32. MD made aware. Family at bedside. Emotional support provided. Madlyn Frankel, RN

## 2019-02-13 NOTE — Progress Notes (Signed)
Rectal tube, foley and PIV removed. Patient to be transported to morgue. AC made aware. Madlyn Frankel, RN

## 2019-02-13 DEATH — deceased

## 2020-06-17 IMAGING — CT CT HEAD W/O CM
3 series · 15 of 47 positions shown, 18 images · non-contrast
Comparison: 01/10/2019

CLINICAL DATA: Stroke. Left-sided weakness. Confusion and slurred
speech. Multiple recent posterior circulation infarctions

EXAM:
CT HEAD WITHOUT CONTRAST
TECHNIQUE: Contiguous axial images were obtained from the base of the skull
through the vertex without intravenous contrast.

[Series 3: head wo · axial · 0.43mm/px · z∈[-162,-37]mm · 9 of 30 slices shown, 12 images]
[im 3/30  brain]
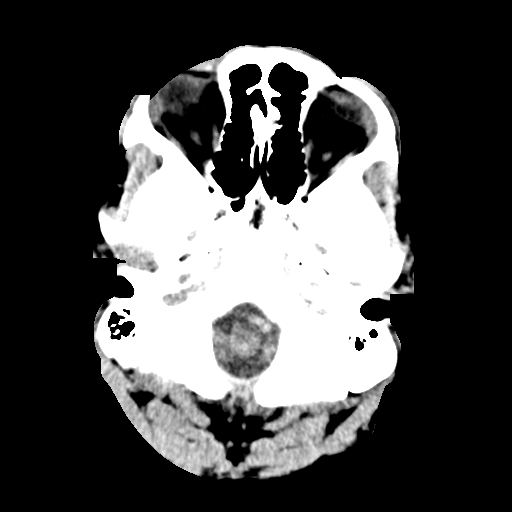
[im 3/30  bone]
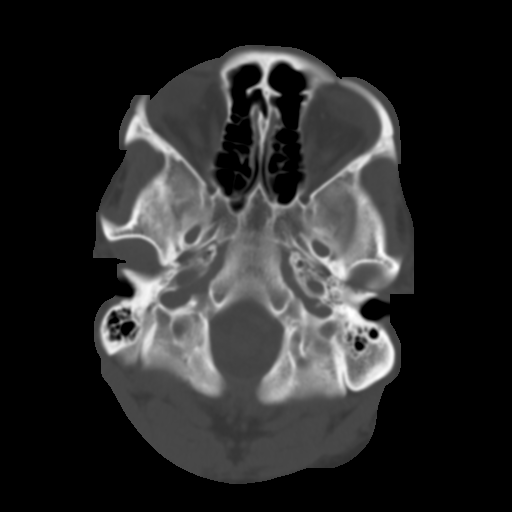
[im 6/30  brain]
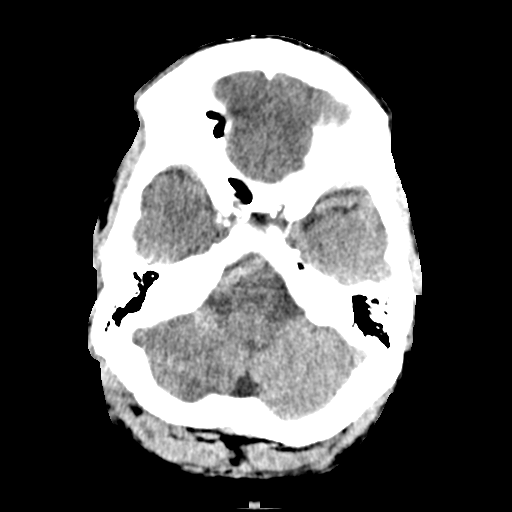
[im 9/30  brain]
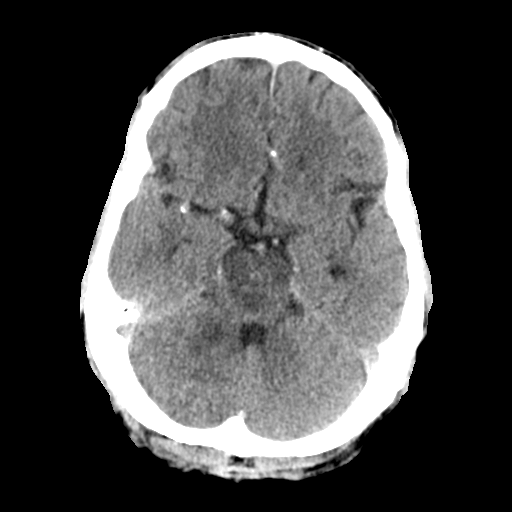
[im 12/30  brain]
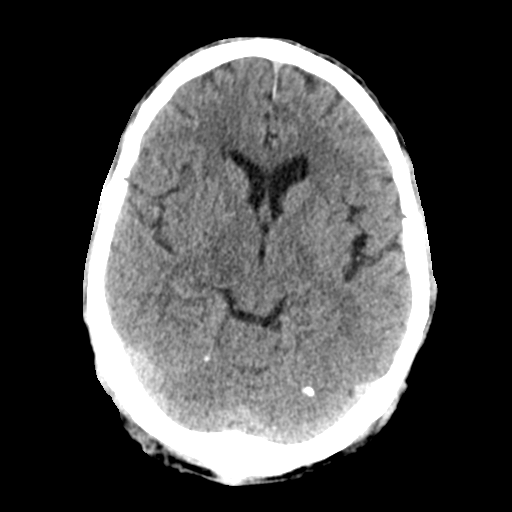
[im 16/30  brain]
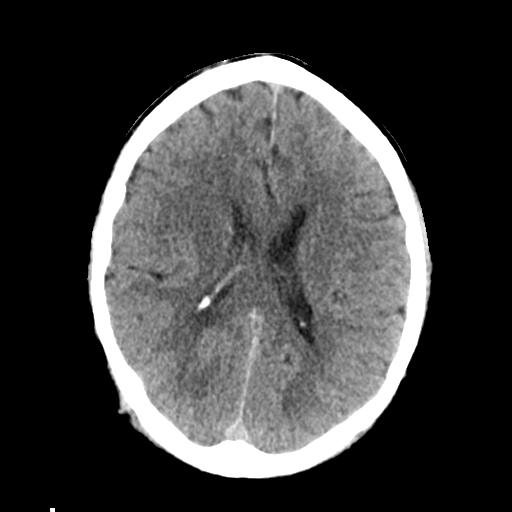
[im 16/30  bone]
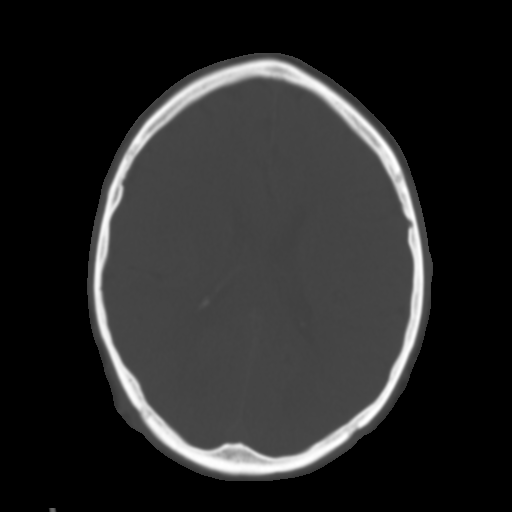
[im 19/30  brain]
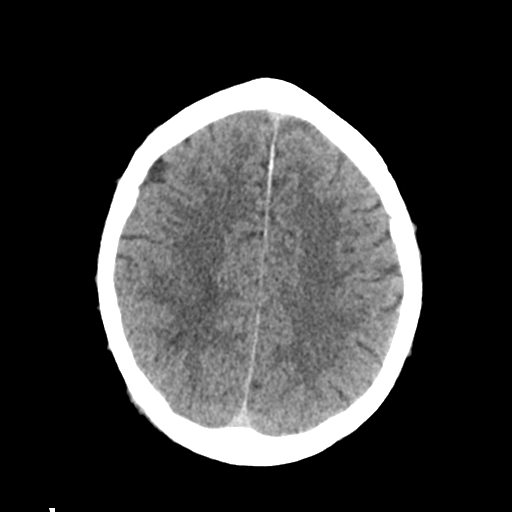
[im 22/30  brain]
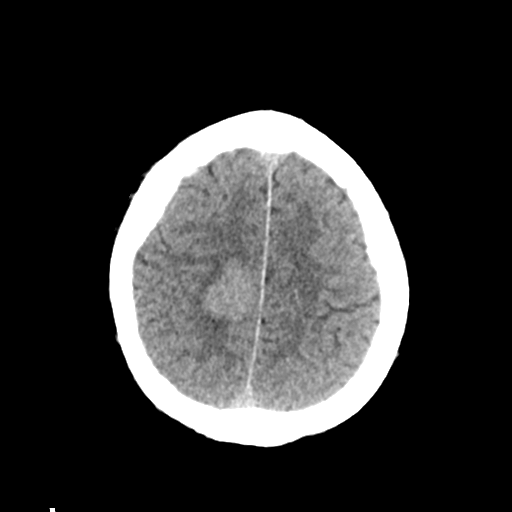
[im 25/30  brain]
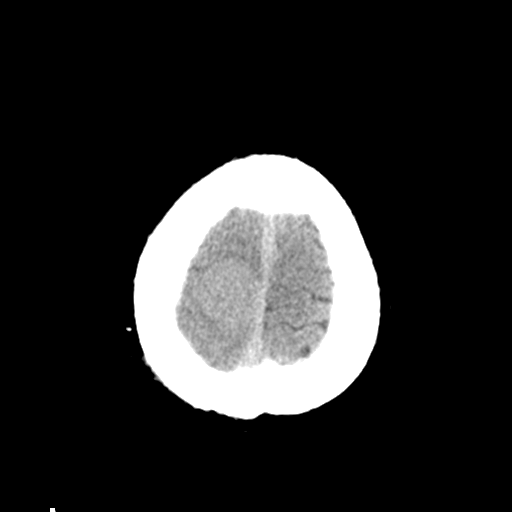
[im 28/30  brain]
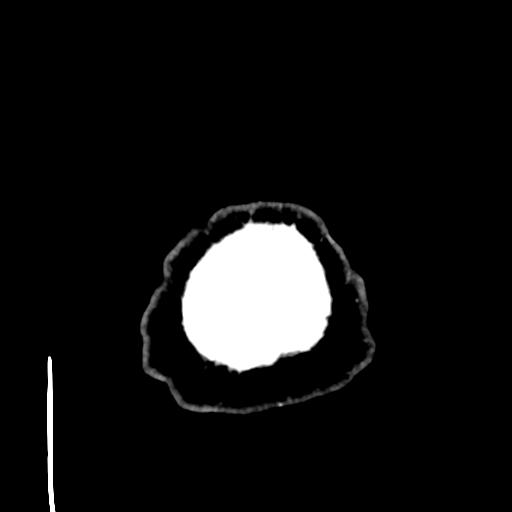
[im 28/30  bone]
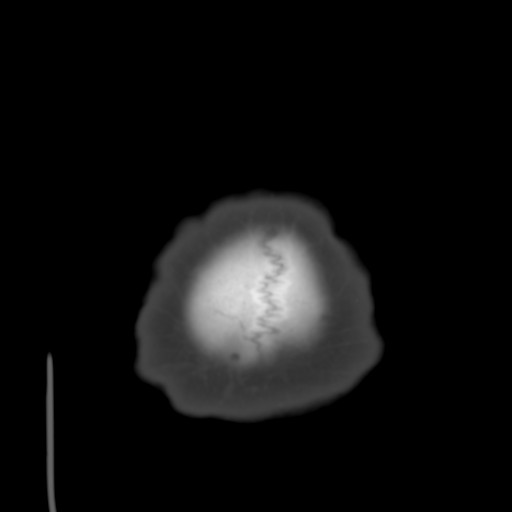

[Series 4: coronal soft tissue · coronal · 0.37mm/px · 3 of 67 slices shown]
[im 23/67  brain]
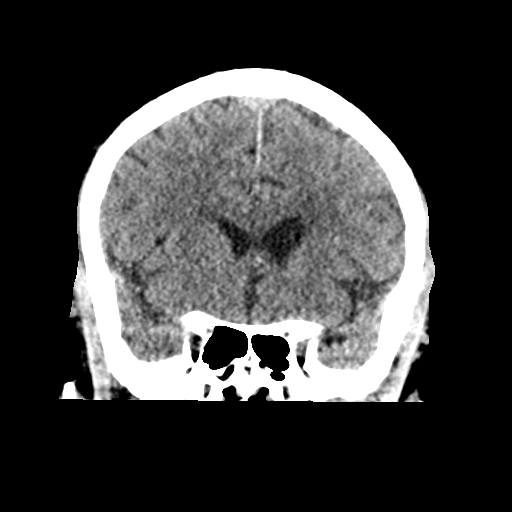
[im 30/67  brain]
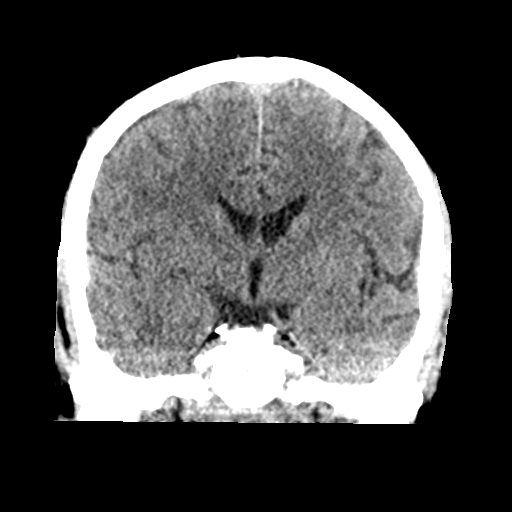
[im 37/67  brain]
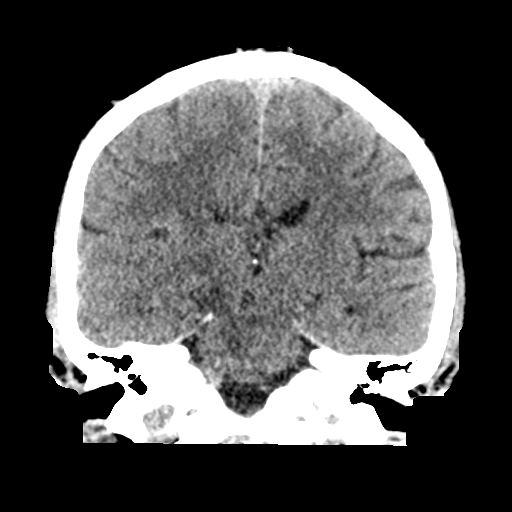

[Series 5: sagittal soft tissue · sagittal · 0.35mm/px · 3 of 57 slices shown]
[im 19/57  brain]
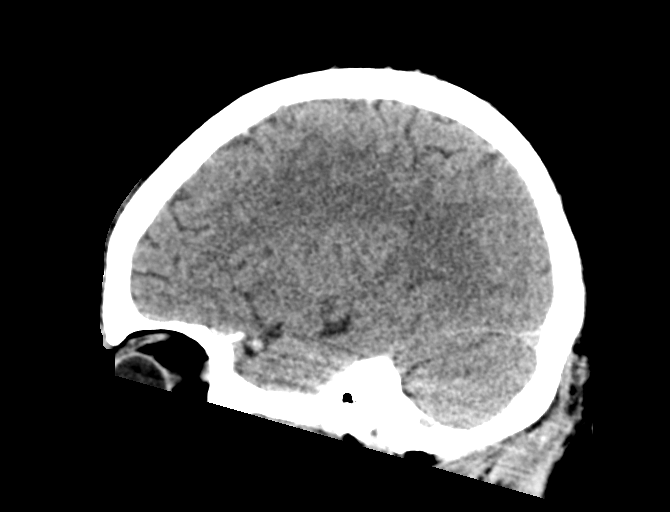
[im 29/57  brain]
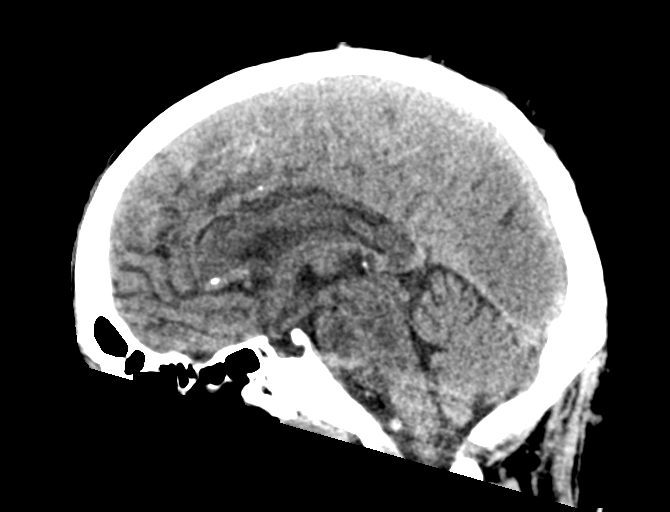
[im 38/57  brain]
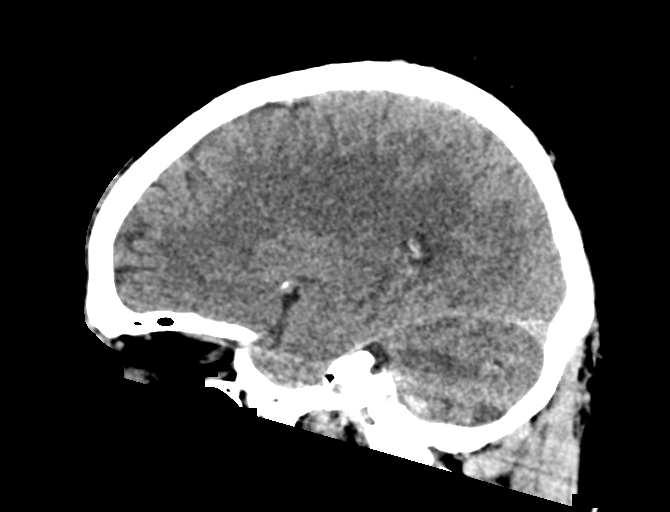

[15 of 47 positions shown; findings below may reference images not displayed]

FINDINGS: Brain: No discernible change. Areas of low-density related to the
acute infarctions in the right pons and midbrain, the right
cerebellum and the right occipital lobe. No evidence of new acute
infarction. No hemorrhage. No change and swelling. No hydrocephalus.
3.4 cm right parasagittal vertex meningioma is unchanged.

Vascular: There is atherosclerotic calcification of the major
vessels at the base of the brain.

Skull: Negative

Sinuses/Orbits: Clear/normal

Other: None
IMPRESSION: 1. No change since yesterday's study. Areas of low-density related
to the acute infarctions in the right pons and midbrain, the right
cerebellum and the right occipital lobe. No evidence of new
infarction or hemorrhage. No hydrocephalus.
2. 3.4 cm right parasagittal vertex meningioma, unchanged.
3. Atherosclerosis of the major vessels at the base of the brain.
